# Patient Record
Sex: Female | Born: 1935 | Race: White | Hispanic: No | Smoking: Former smoker
Health system: Southern US, Community
[De-identification: ages and names within clinical notes are randomized; demographics above are authoritative.]

## PROBLEM LIST (undated history)

## (undated) DIAGNOSIS — Z9889 Other specified postprocedural states: Secondary | ICD-10-CM

## (undated) DIAGNOSIS — K219 Gastro-esophageal reflux disease without esophagitis: Secondary | ICD-10-CM

## (undated) DIAGNOSIS — J302 Other seasonal allergic rhinitis: Secondary | ICD-10-CM

## (undated) DIAGNOSIS — R011 Cardiac murmur, unspecified: Secondary | ICD-10-CM

## (undated) DIAGNOSIS — G8929 Other chronic pain: Secondary | ICD-10-CM

## (undated) DIAGNOSIS — M549 Dorsalgia, unspecified: Secondary | ICD-10-CM

## (undated) DIAGNOSIS — E039 Hypothyroidism, unspecified: Secondary | ICD-10-CM

## (undated) DIAGNOSIS — N8110 Cystocele, unspecified: Secondary | ICD-10-CM

## (undated) DIAGNOSIS — F329 Major depressive disorder, single episode, unspecified: Secondary | ICD-10-CM

## (undated) DIAGNOSIS — Z8719 Personal history of other diseases of the digestive system: Secondary | ICD-10-CM

## (undated) DIAGNOSIS — Z8711 Personal history of peptic ulcer disease: Secondary | ICD-10-CM

## (undated) DIAGNOSIS — F419 Anxiety disorder, unspecified: Secondary | ICD-10-CM

## (undated) DIAGNOSIS — C44311 Basal cell carcinoma of skin of nose: Secondary | ICD-10-CM

## (undated) DIAGNOSIS — F32A Depression, unspecified: Secondary | ICD-10-CM

## (undated) DIAGNOSIS — M1990 Unspecified osteoarthritis, unspecified site: Secondary | ICD-10-CM

## (undated) DIAGNOSIS — C50912 Malignant neoplasm of unspecified site of left female breast: Secondary | ICD-10-CM

## (undated) DIAGNOSIS — E7800 Pure hypercholesterolemia, unspecified: Secondary | ICD-10-CM

## (undated) DIAGNOSIS — G43909 Migraine, unspecified, not intractable, without status migrainosus: Secondary | ICD-10-CM

## (undated) DIAGNOSIS — C8590 Non-Hodgkin lymphoma, unspecified, unspecified site: Secondary | ICD-10-CM

## (undated) HISTORY — DX: Other seasonal allergic rhinitis: J30.2

## (undated) HISTORY — DX: Gastro-esophageal reflux disease without esophagitis: K21.9

## (undated) HISTORY — DX: Major depressive disorder, single episode, unspecified: F32.9

## (undated) HISTORY — DX: Depression, unspecified: F32.A

## (undated) HISTORY — PX: CATARACT EXTRACTION W/ INTRAOCULAR LENS  IMPLANT, BILATERAL: SHX1307

## (undated) HISTORY — DX: Personal history of other diseases of the digestive system: Z87.19

## (undated) HISTORY — PX: BACK SURGERY: SHX140

## (undated) HISTORY — DX: Pure hypercholesterolemia, unspecified: E78.00

## (undated) HISTORY — DX: Other chronic pain: G89.29

## (undated) HISTORY — DX: Hypothyroidism, unspecified: E03.9

## (undated) HISTORY — DX: Personal history of peptic ulcer disease: Z87.11

## (undated) HISTORY — DX: Dorsalgia, unspecified: M54.9

## (undated) HISTORY — DX: Anxiety disorder, unspecified: F41.9

## (undated) HISTORY — PX: ABDOMINAL HYSTERECTOMY: SHX81

---
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
## 1968-03-21 DIAGNOSIS — C50912 Malignant neoplasm of unspecified site of left female breast: Secondary | ICD-10-CM

## 1968-03-21 HISTORY — PX: MASTECTOMY, RADICAL: SHX710

## 1968-03-21 HISTORY — DX: Malignant neoplasm of unspecified site of left female breast: C50.912

## 1968-03-21 HISTORY — PX: BREAST BIOPSY: SHX20

## 1997-11-28 ENCOUNTER — Ambulatory Visit (HOSPITAL_COMMUNITY): Admission: RE | Admit: 1997-11-28 | Discharge: 1997-11-28 | Payer: Self-pay | Admitting: Family Medicine

## 1997-12-08 ENCOUNTER — Ambulatory Visit (HOSPITAL_COMMUNITY): Admission: RE | Admit: 1997-12-08 | Discharge: 1997-12-08 | Payer: Self-pay | Admitting: Family Medicine

## 1999-01-22 ENCOUNTER — Encounter (INDEPENDENT_AMBULATORY_CARE_PROVIDER_SITE_OTHER): Payer: Self-pay | Admitting: Specialist

## 1999-01-22 ENCOUNTER — Ambulatory Visit (HOSPITAL_COMMUNITY): Admission: RE | Admit: 1999-01-22 | Discharge: 1999-01-22 | Payer: Self-pay | Admitting: Gastroenterology

## 1999-03-22 DIAGNOSIS — C8590 Non-Hodgkin lymphoma, unspecified, unspecified site: Secondary | ICD-10-CM

## 1999-03-22 HISTORY — DX: Non-Hodgkin lymphoma, unspecified, unspecified site: C85.90

## 1999-03-22 HISTORY — PX: BONE MARROW BIOPSY: SHX199

## 1999-09-02 ENCOUNTER — Encounter: Payer: Self-pay | Admitting: Family Medicine

## 1999-09-02 ENCOUNTER — Ambulatory Visit (HOSPITAL_COMMUNITY): Admission: RE | Admit: 1999-09-02 | Discharge: 1999-09-02 | Payer: Self-pay | Admitting: Family Medicine

## 1999-09-24 ENCOUNTER — Ambulatory Visit (HOSPITAL_COMMUNITY): Admission: RE | Admit: 1999-09-24 | Discharge: 1999-09-24 | Payer: Self-pay | Admitting: Family Medicine

## 1999-09-24 ENCOUNTER — Encounter: Payer: Self-pay | Admitting: Family Medicine

## 1999-09-25 ENCOUNTER — Emergency Department (HOSPITAL_COMMUNITY): Admission: EM | Admit: 1999-09-25 | Discharge: 1999-09-25 | Payer: Self-pay | Admitting: Emergency Medicine

## 1999-09-25 ENCOUNTER — Encounter: Payer: Self-pay | Admitting: Emergency Medicine

## 1999-10-04 ENCOUNTER — Ambulatory Visit (HOSPITAL_COMMUNITY): Admission: RE | Admit: 1999-10-04 | Discharge: 1999-10-04 | Payer: Self-pay | Admitting: Gastroenterology

## 1999-10-04 ENCOUNTER — Encounter: Payer: Self-pay | Admitting: Gastroenterology

## 1999-10-04 ENCOUNTER — Encounter (INDEPENDENT_AMBULATORY_CARE_PROVIDER_SITE_OTHER): Payer: Self-pay | Admitting: *Deleted

## 1999-10-14 ENCOUNTER — Encounter: Payer: Self-pay | Admitting: Oncology

## 1999-10-14 ENCOUNTER — Ambulatory Visit (HOSPITAL_COMMUNITY): Admission: RE | Admit: 1999-10-14 | Discharge: 1999-10-14 | Payer: Self-pay | Admitting: Oncology

## 1999-11-10 ENCOUNTER — Encounter: Admission: RE | Admit: 1999-11-10 | Discharge: 1999-11-10 | Payer: Self-pay | Admitting: General Surgery

## 1999-11-10 ENCOUNTER — Encounter: Payer: Self-pay | Admitting: General Surgery

## 1999-11-11 ENCOUNTER — Encounter: Payer: Self-pay | Admitting: General Surgery

## 1999-11-11 ENCOUNTER — Ambulatory Visit (HOSPITAL_BASED_OUTPATIENT_CLINIC_OR_DEPARTMENT_OTHER): Admission: RE | Admit: 1999-11-11 | Discharge: 1999-11-11 | Payer: Self-pay | Admitting: General Surgery

## 2000-01-29 ENCOUNTER — Emergency Department (HOSPITAL_COMMUNITY): Admission: EM | Admit: 2000-01-29 | Discharge: 2000-01-29 | Payer: Self-pay | Admitting: Emergency Medicine

## 2000-01-29 ENCOUNTER — Encounter: Payer: Self-pay | Admitting: Hematology & Oncology

## 2000-02-11 ENCOUNTER — Encounter: Admission: RE | Admit: 2000-02-11 | Discharge: 2000-02-11 | Payer: Self-pay | Admitting: Oncology

## 2000-02-11 ENCOUNTER — Encounter: Payer: Self-pay | Admitting: Oncology

## 2000-04-17 ENCOUNTER — Encounter: Payer: Self-pay | Admitting: Oncology

## 2000-04-17 ENCOUNTER — Encounter: Admission: RE | Admit: 2000-04-17 | Discharge: 2000-04-17 | Payer: Self-pay | Admitting: Oncology

## 2000-08-11 ENCOUNTER — Encounter: Admission: RE | Admit: 2000-08-11 | Discharge: 2000-08-11 | Payer: Self-pay | Admitting: Oncology

## 2000-08-11 ENCOUNTER — Encounter: Payer: Self-pay | Admitting: Oncology

## 2000-12-13 ENCOUNTER — Encounter: Admission: RE | Admit: 2000-12-13 | Discharge: 2000-12-13 | Payer: Self-pay | Admitting: Oncology

## 2000-12-13 ENCOUNTER — Encounter: Payer: Self-pay | Admitting: Oncology

## 2001-04-17 ENCOUNTER — Ambulatory Visit (HOSPITAL_COMMUNITY): Admission: RE | Admit: 2001-04-17 | Discharge: 2001-04-17 | Payer: Self-pay | Admitting: Oncology

## 2001-04-17 ENCOUNTER — Encounter: Payer: Self-pay | Admitting: Oncology

## 2001-07-24 ENCOUNTER — Encounter: Payer: Self-pay | Admitting: Family Medicine

## 2001-07-24 ENCOUNTER — Ambulatory Visit (HOSPITAL_COMMUNITY): Admission: RE | Admit: 2001-07-24 | Discharge: 2001-07-24 | Payer: Self-pay | Admitting: Family Medicine

## 2001-10-11 ENCOUNTER — Ambulatory Visit (HOSPITAL_COMMUNITY): Admission: RE | Admit: 2001-10-11 | Discharge: 2001-10-11 | Payer: Self-pay | Admitting: Oncology

## 2001-10-11 ENCOUNTER — Encounter: Payer: Self-pay | Admitting: Oncology

## 2002-01-03 ENCOUNTER — Emergency Department (HOSPITAL_COMMUNITY): Admission: EM | Admit: 2002-01-03 | Discharge: 2002-01-03 | Payer: Self-pay | Admitting: Emergency Medicine

## 2002-04-24 ENCOUNTER — Encounter: Payer: Self-pay | Admitting: Oncology

## 2002-04-24 ENCOUNTER — Ambulatory Visit (HOSPITAL_COMMUNITY): Admission: RE | Admit: 2002-04-24 | Discharge: 2002-04-24 | Payer: Self-pay | Admitting: Oncology

## 2002-06-13 ENCOUNTER — Other Ambulatory Visit (HOSPITAL_COMMUNITY): Admission: RE | Admit: 2002-06-13 | Discharge: 2002-06-27 | Payer: Self-pay | Admitting: *Deleted

## 2002-08-13 ENCOUNTER — Encounter: Payer: Self-pay | Admitting: Oncology

## 2002-08-13 ENCOUNTER — Ambulatory Visit (HOSPITAL_COMMUNITY): Admission: RE | Admit: 2002-08-13 | Discharge: 2002-08-13 | Payer: Self-pay | Admitting: Oncology

## 2002-10-21 ENCOUNTER — Ambulatory Visit (HOSPITAL_COMMUNITY): Admission: RE | Admit: 2002-10-21 | Discharge: 2002-10-21 | Payer: Self-pay | Admitting: Gastroenterology

## 2002-12-20 ENCOUNTER — Ambulatory Visit (HOSPITAL_COMMUNITY): Admission: RE | Admit: 2002-12-20 | Discharge: 2002-12-20 | Payer: Self-pay | Admitting: Oncology

## 2002-12-20 ENCOUNTER — Encounter: Payer: Self-pay | Admitting: Oncology

## 2003-04-23 ENCOUNTER — Ambulatory Visit (HOSPITAL_COMMUNITY): Admission: RE | Admit: 2003-04-23 | Discharge: 2003-04-23 | Payer: Self-pay | Admitting: Oncology

## 2003-05-12 ENCOUNTER — Ambulatory Visit (HOSPITAL_BASED_OUTPATIENT_CLINIC_OR_DEPARTMENT_OTHER): Admission: RE | Admit: 2003-05-12 | Discharge: 2003-05-12 | Payer: Self-pay | Admitting: General Surgery

## 2003-10-22 ENCOUNTER — Ambulatory Visit (HOSPITAL_COMMUNITY): Admission: RE | Admit: 2003-10-22 | Discharge: 2003-10-22 | Payer: Self-pay | Admitting: Oncology

## 2004-04-21 ENCOUNTER — Ambulatory Visit: Payer: Self-pay | Admitting: Oncology

## 2004-04-21 ENCOUNTER — Ambulatory Visit (HOSPITAL_COMMUNITY): Admission: RE | Admit: 2004-04-21 | Discharge: 2004-04-21 | Payer: Self-pay | Admitting: Oncology

## 2004-07-29 ENCOUNTER — Emergency Department (HOSPITAL_COMMUNITY): Admission: EM | Admit: 2004-07-29 | Discharge: 2004-07-29 | Payer: Self-pay | Admitting: Emergency Medicine

## 2004-10-20 ENCOUNTER — Ambulatory Visit: Payer: Self-pay | Admitting: Oncology

## 2004-10-22 ENCOUNTER — Ambulatory Visit (HOSPITAL_COMMUNITY): Admission: RE | Admit: 2004-10-22 | Discharge: 2004-10-22 | Payer: Self-pay | Admitting: Oncology

## 2005-02-11 ENCOUNTER — Emergency Department (HOSPITAL_COMMUNITY): Admission: EM | Admit: 2005-02-11 | Discharge: 2005-02-11 | Payer: Self-pay | Admitting: Emergency Medicine

## 2005-04-21 ENCOUNTER — Ambulatory Visit: Payer: Self-pay | Admitting: Oncology

## 2005-10-25 ENCOUNTER — Ambulatory Visit: Payer: Self-pay | Admitting: Oncology

## 2005-10-27 LAB — CBC WITH DIFFERENTIAL/PLATELET
BASO%: 0.7 % (ref 0.0–2.0)
Eosinophils Absolute: 0.3 10*3/uL (ref 0.0–0.5)
HCT: 37.6 % (ref 34.8–46.6)
MCHC: 35 g/dL (ref 32.0–36.0)
MONO#: 0.5 10*3/uL (ref 0.1–0.9)
NEUT#: 5.7 10*3/uL (ref 1.5–6.5)
NEUT%: 73.2 % (ref 39.6–76.8)
RBC: 4.16 10*6/uL (ref 3.70–5.32)
WBC: 7.9 10*3/uL (ref 3.9–10.0)
lymph#: 1.2 10*3/uL (ref 0.9–3.3)

## 2005-10-27 LAB — ERYTHROCYTE SEDIMENTATION RATE: Sed Rate: 26 mm/hr (ref 0–30)

## 2005-10-28 ENCOUNTER — Ambulatory Visit (HOSPITAL_COMMUNITY): Admission: RE | Admit: 2005-10-28 | Discharge: 2005-10-28 | Payer: Self-pay | Admitting: Oncology

## 2005-10-28 LAB — COMPREHENSIVE METABOLIC PANEL
AST: 28 U/L (ref 0–37)
Albumin: 4.6 g/dL (ref 3.5–5.2)
Alkaline Phosphatase: 82 U/L (ref 39–117)
Calcium: 9.5 mg/dL (ref 8.4–10.5)
Chloride: 102 mEq/L (ref 96–112)
Potassium: 4.1 mEq/L (ref 3.5–5.3)
Sodium: 136 mEq/L (ref 135–145)
Total Protein: 7 g/dL (ref 6.0–8.3)

## 2006-04-12 ENCOUNTER — Encounter: Admission: RE | Admit: 2006-04-12 | Discharge: 2006-07-11 | Payer: Self-pay | Admitting: Family Medicine

## 2006-07-12 ENCOUNTER — Encounter: Admission: RE | Admit: 2006-07-12 | Discharge: 2006-07-27 | Payer: Self-pay | Admitting: Family Medicine

## 2006-10-23 ENCOUNTER — Ambulatory Visit: Payer: Self-pay | Admitting: Oncology

## 2006-10-26 LAB — CBC WITH DIFFERENTIAL/PLATELET
Basophils Absolute: 0 10*3/uL (ref 0.0–0.1)
Eosinophils Absolute: 0.3 10*3/uL (ref 0.0–0.5)
MONO#: 0.6 10*3/uL (ref 0.1–0.9)
NEUT%: 70.1 % (ref 39.6–76.8)
RDW: 15.4 % — ABNORMAL HIGH (ref 11.3–14.5)
lymph#: 1.2 10*3/uL (ref 0.9–3.3)

## 2006-10-27 LAB — COMPREHENSIVE METABOLIC PANEL
Albumin: 4.2 g/dL (ref 3.5–5.2)
BUN: 30 mg/dL — ABNORMAL HIGH (ref 6–23)
Calcium: 9.7 mg/dL (ref 8.4–10.5)
Chloride: 104 mEq/L (ref 96–112)
Glucose, Bld: 91 mg/dL (ref 70–99)
Potassium: 4.1 mEq/L (ref 3.5–5.3)
Sodium: 142 mEq/L (ref 135–145)
Total Protein: 6.9 g/dL (ref 6.0–8.3)

## 2006-10-27 LAB — BETA 2 MICROGLOBULIN, SERUM: Beta-2 Microglobulin: 1.9 mg/L — ABNORMAL HIGH (ref 1.01–1.73)

## 2006-10-31 ENCOUNTER — Ambulatory Visit (HOSPITAL_COMMUNITY): Admission: RE | Admit: 2006-10-31 | Discharge: 2006-10-31 | Payer: Self-pay | Admitting: Oncology

## 2007-10-23 ENCOUNTER — Ambulatory Visit: Payer: Self-pay | Admitting: Oncology

## 2007-11-28 LAB — CBC WITH DIFFERENTIAL/PLATELET
BASO%: 0.2 % (ref 0.0–2.0)
Basophils Absolute: 0 10*3/uL (ref 0.0–0.1)
EOS%: 2.9 % (ref 0.0–7.0)
HCT: 36.7 % (ref 34.8–46.6)
HGB: 12.6 g/dL (ref 11.6–15.9)
LYMPH%: 14.9 % (ref 14.0–48.0)
MCH: 29.8 pg (ref 26.0–34.0)
MCHC: 34.4 g/dL (ref 32.0–36.0)
MCV: 86.7 fL (ref 81.0–101.0)
MONO%: 9.5 % (ref 0.0–13.0)
NEUT%: 72.5 % (ref 39.6–76.8)
Platelets: 224 10*3/uL (ref 145–400)

## 2007-11-29 LAB — COMPREHENSIVE METABOLIC PANEL
AST: 21 U/L (ref 0–37)
Albumin: 4.3 g/dL (ref 3.5–5.2)
Alkaline Phosphatase: 78 U/L (ref 39–117)
BUN: 18 mg/dL (ref 6–23)
Creatinine, Ser: 1.2 mg/dL (ref 0.40–1.20)
Glucose, Bld: 82 mg/dL (ref 70–99)
Potassium: 4.6 mEq/L (ref 3.5–5.3)

## 2007-11-29 LAB — BETA 2 MICROGLOBULIN, SERUM: Beta-2 Microglobulin: 1.4 mg/L (ref 1.01–1.73)

## 2008-05-11 ENCOUNTER — Emergency Department (HOSPITAL_COMMUNITY): Admission: EM | Admit: 2008-05-11 | Discharge: 2008-05-11 | Payer: Self-pay | Admitting: Emergency Medicine

## 2008-11-19 ENCOUNTER — Emergency Department (HOSPITAL_COMMUNITY): Admission: EM | Admit: 2008-11-19 | Discharge: 2008-11-20 | Payer: Self-pay | Admitting: Emergency Medicine

## 2008-11-21 ENCOUNTER — Ambulatory Visit: Payer: Self-pay | Admitting: Oncology

## 2008-11-26 LAB — CBC WITH DIFFERENTIAL/PLATELET
Basophils Absolute: 0 10*3/uL (ref 0.0–0.1)
EOS%: 5.5 % (ref 0.0–7.0)
Eosinophils Absolute: 0.3 10*3/uL (ref 0.0–0.5)
HGB: 11 g/dL — ABNORMAL LOW (ref 11.6–15.9)
MCH: 27.2 pg (ref 25.1–34.0)
MONO%: 7.4 % (ref 0.0–14.0)
NEUT#: 3.9 10*3/uL (ref 1.5–6.5)
RBC: 4.05 10*6/uL (ref 3.70–5.45)
RDW: 15.1 % — ABNORMAL HIGH (ref 11.2–14.5)
lymph#: 1.1 10*3/uL (ref 0.9–3.3)

## 2008-11-27 LAB — VITAMIN D 25 HYDROXY (VIT D DEFICIENCY, FRACTURES): Vit D, 25-Hydroxy: 36 ng/mL (ref 30–89)

## 2008-11-27 LAB — COMPREHENSIVE METABOLIC PANEL
AST: 18 U/L (ref 0–37)
Albumin: 4.4 g/dL (ref 3.5–5.2)
Alkaline Phosphatase: 70 U/L (ref 39–117)
BUN: 19 mg/dL (ref 6–23)
Calcium: 9.6 mg/dL (ref 8.4–10.5)
Chloride: 105 mEq/L (ref 96–112)
Potassium: 4.1 mEq/L (ref 3.5–5.3)
Sodium: 141 mEq/L (ref 135–145)
Total Protein: 7 g/dL (ref 6.0–8.3)

## 2009-05-19 ENCOUNTER — Ambulatory Visit: Payer: Self-pay | Admitting: Oncology

## 2009-11-20 ENCOUNTER — Ambulatory Visit: Payer: Self-pay | Admitting: Oncology

## 2009-11-26 LAB — CBC WITH DIFFERENTIAL/PLATELET
Basophils Absolute: 0 10*3/uL (ref 0.0–0.1)
EOS%: 4.5 % (ref 0.0–7.0)
Eosinophils Absolute: 0.3 10*3/uL (ref 0.0–0.5)
LYMPH%: 19.9 % (ref 14.0–49.7)
MCH: 31.6 pg (ref 25.1–34.0)
MCV: 95 fL (ref 79.5–101.0)
MONO%: 8.5 % (ref 0.0–14.0)
Platelets: 204 10*3/uL (ref 145–400)
RBC: 4.18 10*6/uL (ref 3.70–5.45)
RDW: 13 % (ref 11.2–14.5)

## 2009-11-26 LAB — COMPREHENSIVE METABOLIC PANEL
AST: 17 U/L (ref 0–37)
Albumin: 4.3 g/dL (ref 3.5–5.2)
Alkaline Phosphatase: 64 U/L (ref 39–117)
BUN: 20 mg/dL (ref 6–23)
Glucose, Bld: 96 mg/dL (ref 70–99)
Potassium: 4.4 mEq/L (ref 3.5–5.3)
Sodium: 142 mEq/L (ref 135–145)
Total Bilirubin: 0.3 mg/dL (ref 0.3–1.2)

## 2009-11-26 LAB — VITAMIN D 25 HYDROXY (VIT D DEFICIENCY, FRACTURES): Vit D, 25-Hydroxy: 37 ng/mL (ref 30–89)

## 2009-11-26 LAB — CANCER ANTIGEN 27.29: CA 27.29: 37 U/mL (ref 0–39)

## 2010-01-13 ENCOUNTER — Emergency Department (HOSPITAL_COMMUNITY): Admission: EM | Admit: 2010-01-13 | Discharge: 2010-01-13 | Payer: Self-pay | Admitting: Emergency Medicine

## 2010-03-21 HISTORY — PX: LUMBAR LAMINECTOMY: SHX95

## 2010-04-11 ENCOUNTER — Emergency Department (HOSPITAL_COMMUNITY)
Admission: EM | Admit: 2010-04-11 | Discharge: 2010-04-11 | Payer: Self-pay | Source: Home / Self Care | Admitting: Emergency Medicine

## 2010-04-13 LAB — DIFFERENTIAL
Basophils Absolute: 0 10*3/uL (ref 0.0–0.1)
Basophils Relative: 0 % (ref 0–1)
Monocytes Relative: 8 % (ref 3–12)
Neutro Abs: 6.2 10*3/uL (ref 1.7–7.7)
Neutrophils Relative %: 80 % — ABNORMAL HIGH (ref 43–77)

## 2010-04-13 LAB — CBC
Hemoglobin: 14.3 g/dL (ref 12.0–15.0)
Platelets: 194 10*3/uL (ref 150–400)
RBC: 4.34 MIL/uL (ref 3.87–5.11)
WBC: 7.8 10*3/uL (ref 4.0–10.5)

## 2010-04-13 LAB — COMPREHENSIVE METABOLIC PANEL
ALT: 18 U/L (ref 0–35)
AST: 20 U/L (ref 0–37)
Albumin: 3.8 g/dL (ref 3.5–5.2)
CO2: 26 mEq/L (ref 19–32)
Chloride: 107 mEq/L (ref 96–112)
GFR calc Af Amer: >60 mL/min (ref >60)
GFR calc non Af Amer: >60 mL/min (ref >60)
Potassium: 4.1 mEq/L (ref 3.5–5.1)
Sodium: 142 mEq/L (ref 135–145)
Total Bilirubin: 0.6 mg/dL (ref 0.3–1.2)

## 2010-06-25 LAB — CBC
HCT: 30.6 % — ABNORMAL LOW (ref 36.0–46.0)
Hemoglobin: 10 g/dL — ABNORMAL LOW (ref 12.0–15.0)
MCHC: 32.8 g/dL (ref 30.0–36.0)
MCV: 82.1 fL (ref 78.0–100.0)
Platelets: 227 10*3/uL (ref 150–400)
RDW: 14.9 % (ref 11.5–15.5)

## 2010-06-25 LAB — URINE CULTURE: Culture: NO GROWTH

## 2010-06-25 LAB — URINALYSIS, ROUTINE W REFLEX MICROSCOPIC
Hgb urine dipstick: NEGATIVE
Nitrite: NEGATIVE
Protein, ur: NEGATIVE mg/dL
Specific Gravity, Urine: 1.023 (ref 1.005–1.030)
Urobilinogen, UA: 0.2 mg/dL (ref 0.0–1.0)

## 2010-06-25 LAB — URINE MICROSCOPIC-ADD ON

## 2010-06-25 LAB — POCT I-STAT, CHEM 8
BUN: 32 mg/dL — ABNORMAL HIGH (ref 6–23)
Calcium, Ion: 1.03 mmol/L — ABNORMAL LOW (ref 1.12–1.32)
Hemoglobin: 10.2 g/dL — ABNORMAL LOW (ref 12.0–15.0)
Sodium: 139 mEq/L (ref 135–145)
TCO2: 23 mmol/L (ref 0–100)

## 2010-06-25 LAB — POCT CARDIAC MARKERS

## 2010-06-25 LAB — DIFFERENTIAL
Basophils Absolute: 0 10*3/uL (ref 0.0–0.1)
Eosinophils Absolute: 0.6 10*3/uL (ref 0.0–0.7)
Eosinophils Relative: 10 % — ABNORMAL HIGH (ref 0–5)
Lymphocytes Relative: 16 % (ref 12–46)
Monocytes Absolute: 0.6 10*3/uL (ref 0.1–1.0)

## 2010-07-06 LAB — DIFFERENTIAL
Basophils Absolute: 0 10*3/uL (ref 0.0–0.1)
Basophils Relative: 0 % (ref 0–1)
Lymphocytes Relative: 15 % (ref 12–46)
Neutro Abs: 5.4 10*3/uL (ref 1.7–7.7)

## 2010-07-06 LAB — CBC
HCT: 41.7 % (ref 36.0–46.0)
Hemoglobin: 14.2 g/dL (ref 12.0–15.0)
MCV: 92.6 fL (ref 78.0–100.0)
RBC: 4.5 MIL/uL (ref 3.87–5.11)
WBC: 7.6 10*3/uL (ref 4.0–10.5)

## 2010-07-06 LAB — URINALYSIS, ROUTINE W REFLEX MICROSCOPIC
Bilirubin Urine: NEGATIVE
Ketones, ur: NEGATIVE mg/dL
Nitrite: NEGATIVE
Specific Gravity, Urine: 1.012 (ref 1.005–1.030)
Urobilinogen, UA: 0.2 mg/dL (ref 0.0–1.0)

## 2010-07-06 LAB — BASIC METABOLIC PANEL
BUN: 18 mg/dL (ref 6–23)
CO2: 25 mEq/L (ref 19–32)
GFR calc non Af Amer: 49 mL/min — ABNORMAL LOW (ref >60)

## 2010-07-06 LAB — TROPONIN I: Troponin I: 0.01 ng/mL (ref 0.00–0.06)

## 2010-07-06 LAB — CK TOTAL AND CKMB (NOT AT ARMC): Relative Index: 3.1 — ABNORMAL HIGH (ref 0.0–2.5)

## 2010-08-06 NOTE — Op Note (Signed)
NAME:  Dana [name], Dana [name]                         ACCOUNT NO.:  [id number]   MEDICAL RECORD NO.:  [id number]                   PATIENT TYPE:  AMB   LOCATION:  ENDO                                 FACILITY:  [facility]   PHYSICIAN:  Petra Kuba, M.D.                 DATE OF BIRTH:  03-27-1927   DATE OF PROCEDURE:  10/21/2002  DATE OF DISCHARGE:                                 OPERATIVE REPORT   PROCEDURE:  Colonoscopy.   INDICATION:  The patient with a history of colon polyps, due for repeat  screening.  Consent was signed after risks, benefits, methods, options  thoroughly discussed in the office on multiple occasions.   MEDICINES USED:  1. Demerol 60.  2. Versed 5.   DESCRIPTION OF PROCEDURE:  Rectal inspection was pertinent for external  hemorrhoids, small.  Digital exam was negative.  The video pediatric  adjustable colonoscope was inserted, fairly easily advanced around the colon  to the cecum.  This did require some abdominal pressure but no position  changes.  On insertion, other than some occasional left-sided diverticula,  no abnormalities were seen.  The cecum was identified by the appendiceal  orifice and the ileocecal valve.  In fact, the scope was inserted a short  ways into the terminal ileum which was normal.  Photodocumentation was  obtained.  The scope was slowly withdrawn.  The TI was normal.  The prep was  adequate.  There was some liquid stool that required washing and suctioning  but on slow withdrawal back through the colon, other than some occasional  left-sided diverticula, no abnormalities were seen.  Anorectal pull-through  and retroflexion confirmed some small hemorrhoids back in the rectum.  The  scope was reinserted a short ways up the left side of the colon; air was  suctioned, scope removed.  The patient tolerated the procedure well.  There  was no obvious immediate complication.   ENDOSCOPIC DIAGNOSES:  1. Internal-external hemorrhoids.  2.  Left-sided diverticula.  3. Otherwise, within normal limits to the terminal ileum.    PLAN:  1. Yearly rectals and guaiacs per Dario Guardian, M.D. or Pierce Crane,     M.D. and is oncologic team.  2. Happy to see back p.r.n.  3. Otherwise, consider repeat screening in five years if doing well     medically.                                               Petra Kuba, M.D.    MEM/MEDQ  D:  10/21/2002  T:  10/21/2002  Job:  [id number]   cc:   Dario Guardian, M.D.  [address]  [place]  [place] [address]  Fax: [phone]   Pierce Crane, M.D.  [address] - [facility]  [place]  [place] [address]  Fax: [phone]

## 2010-08-06 NOTE — Op Note (Signed)
[place]  Patient:    Dana [name], Dana [name]                      MRN: [id number] Proc. Date: 11/11/99 Adm. Date:  [id number] Attending:  Janalyn Rouse CC:         Pierce Crane, M.D.   Operative Report  PREOPERATIVE DIAGNOSES: 1. Non-Hodgkins lymphoma. 2. History of breast cancer.  POSTOPERATIVE DIAGNOSES: 1. Non-Hodgkins lymphoma. 2. History of breast cancer.  OPERATION PERFORMED:  Insertion of a Port-A-Cath.  ANESTHESIA:  MAC.  OPERATIVE PROCEDURE:  This patient had a previous left modified mastectomy some 30 years ago.  Subsequently, she has now developed Stage IV non-Hodgkins lymphoma for which she is undergoing chemotherapy.  We are going to place a Port-A-Cath.  The patient was placed on the operating table with a roll between the shoulders and the arms fixed at the sides.  Under local anesthesia, a right subclavian puncture was carried out with ease and a guidewire inserted and proper positioning confirmed by fluoroscopy.  Under local, a transverse incision on the right anterior chest wall was made with development of a pocket for the implantable port.  We then tunnelled between the subclavian area and the pocket and then passed the pre-attached catheter of the Bard implantable port.  I fixed the port, which had been flushed with heparinized saline, in the pocket with two dual Prolene sutures. We then passed the dilator and peel-away sheath over the wire, removed the wire, removed the dilator and passed the catheter through the peel-away sheath and removed it.  Again, proper positioning of the catheter tip was confirmed and there was no kinking of the catheter.  The system was then flushed and fully heparinized.  The incisions were closed with subcuticular 4-0 Monocryl and Steri-Strips.  Dressing were applied.  She was not yet accessed.  She was then transferred to the recovery room in satisfactory condition, having tolerated  the procedure well. DD:  11/11/99 TD:  11/11/99 Job: [id number]

## 2010-08-06 NOTE — Op Note (Signed)
NAME:  Dana [name], Dana [name]                         ACCOUNT NO.:  [id number]   MEDICAL RECORD NO.:  [id number]                   PATIENT TYPE:  AMB   LOCATION:  DSC                                  FACILITY:  [facility]   PHYSICIAN:  Rose Phi. Maple Hudson, M.D.                DATE OF BIRTH:  1927/10/10   DATE OF PROCEDURE:  05/12/2003  DATE OF DISCHARGE:                                 OPERATIVE REPORT   PREOPERATIVE DIAGNOSIS:  Status post chemotherapy.   POSTOPERATIVE DIAGNOSIS:  Status post chemotherapy.   PROCEDURE:  Removal of Port-A-Cath.   SURGEON:  Rose Phi. Maple Hudson, M.D.   ANESTHESIA:  Local.   DESCRIPTION OF PROCEDURE:  The patient was placed on the operating table and  the right upper chest with the Port-A-Cath site was prepped and draped in  the usual fashion.  I anesthetized in the old scar.  I then made the  incision and dissected down and identified the catheter which I grasped and  removed.  Then by pulling on the catheter, I exposed the port and the two  sutures holding it in place.  These were cut and the port slipped out. There  was no bleeding.  A subcuticular closure of 4-0 Monocryl and Steri-Strips  were carried out.  Dressing applied.  The patient was transferred to the  recovery room in satisfactory condition having tolerated the procedure well.                                               Rose Phi. Maple Hudson, M.D.    PRY/MEDQ  D:  05/12/2003  T:  05/12/2003  Job:  [id number]

## 2010-11-08 ENCOUNTER — Other Ambulatory Visit: Payer: Self-pay | Admitting: Family Medicine

## 2010-11-08 DIAGNOSIS — M545 Low back pain: Secondary | ICD-10-CM

## 2010-11-13 ENCOUNTER — Ambulatory Visit
Admission: RE | Admit: 2010-11-13 | Discharge: 2010-11-13 | Disposition: A | Discharge status: Home / Self Care | Payer: Self-pay | Source: Ambulatory Visit | Attending: Family Medicine | Admitting: Family Medicine

## 2010-11-13 DIAGNOSIS — M545 Low back pain: Secondary | ICD-10-CM

## 2010-11-14 ENCOUNTER — Emergency Department (HOSPITAL_COMMUNITY): Payer: Medicare Other

## 2010-11-14 ENCOUNTER — Inpatient Hospital Stay (HOSPITAL_COMMUNITY)
Admission: EM | Admit: 2010-11-14 | Discharge: 2010-11-16 | DRG: 313 | Disposition: A | Discharge status: Home Health | Payer: Medicare Other | Attending: Internal Medicine | Admitting: Internal Medicine

## 2010-11-14 DIAGNOSIS — M48061 Spinal stenosis, lumbar region without neurogenic claudication: Secondary | ICD-10-CM | POA: Diagnosis present

## 2010-11-14 DIAGNOSIS — E039 Hypothyroidism, unspecified: Secondary | ICD-10-CM | POA: Diagnosis present

## 2010-11-14 DIAGNOSIS — M545 Low back pain, unspecified: Secondary | ICD-10-CM | POA: Diagnosis present

## 2010-11-14 DIAGNOSIS — M5430 Sciatica, unspecified side: Secondary | ICD-10-CM | POA: Diagnosis present

## 2010-11-14 DIAGNOSIS — R0789 Other chest pain: Principal | ICD-10-CM | POA: Diagnosis present

## 2010-11-14 DIAGNOSIS — G8929 Other chronic pain: Secondary | ICD-10-CM | POA: Diagnosis present

## 2010-11-14 DIAGNOSIS — Z853 Personal history of malignant neoplasm of breast: Secondary | ICD-10-CM

## 2010-11-14 LAB — CBC
MCH: 32.3 pg (ref 26.0–34.0)
Platelets: 207 10*3/uL (ref 150–400)
RBC: 3.93 MIL/uL (ref 3.87–5.11)
RDW: 12.3 % (ref 11.5–15.5)
WBC: 7.4 10*3/uL (ref 4.0–10.5)

## 2010-11-14 LAB — COMPREHENSIVE METABOLIC PANEL
ALT: 16 U/L (ref 0–35)
BUN: 18 mg/dL (ref 6–23)
CO2: 26 mEq/L (ref 19–32)
Calcium: 10.1 mg/dL (ref 8.4–10.5)
Creatinine, Ser: 0.76 mg/dL (ref 0.50–1.10)
GFR calc Af Amer: >60 mL/min (ref >60)
GFR calc non Af Amer: >60 mL/min (ref >60)
Glucose, Bld: 102 mg/dL — ABNORMAL HIGH (ref 70–99)
Sodium: 136 mEq/L (ref 135–145)
Total Protein: 7.1 g/dL (ref 6.0–8.3)

## 2010-11-14 LAB — CARDIAC PANEL(CRET KIN+CKTOT+MB+TROPI)
CK, MB: 5.8 ng/mL — ABNORMAL HIGH (ref 0.3–4.0)
Relative Index: 3.2 — ABNORMAL HIGH (ref 0.0–2.5)
Total CK: 198 U/L — ABNORMAL HIGH (ref 7–177)
Total CK: 180 U/L — ABNORMAL HIGH (ref 7–177)
Troponin I: <0.3 ng/mL (ref <0.30)

## 2010-11-14 LAB — PROTIME-INR: Prothrombin Time: 13.4 seconds (ref 11.6–15.2)

## 2010-11-14 LAB — DIFFERENTIAL
Basophils Relative: 1 % (ref 0–1)
Eosinophils Absolute: 0.5 10*3/uL (ref 0.0–0.7)
Eosinophils Relative: 6 % — ABNORMAL HIGH (ref 0–5)
Monocytes Relative: 8 % (ref 3–12)
Neutrophils Relative %: 66 % (ref 43–77)

## 2010-11-14 LAB — POCT I-STAT TROPONIN I

## 2010-11-14 LAB — CK TOTAL AND CKMB (NOT AT ARMC)
CK, MB: 6.8 ng/mL (ref 0.3–4.0)
Relative Index: 3.1 — ABNORMAL HIGH (ref 0.0–2.5)
Total CK: 216 U/L — ABNORMAL HIGH (ref 7–177)

## 2010-11-14 NOTE — H&P (Signed)
[name], [name]               ACCOUNT NO.:  [id number]  MEDICAL RECORD NO.:  [id number]  LOCATION:  WLED                         FACILITY:  [facility]  PHYSICIAN:  Celso Amy, MD   DATE OF BIRTH:  1927-11-09  DATE OF ADMISSION:  11/14/2010 DATE OF DISCHARGE:                             HISTORY & PHYSICAL   PRIMARY CARE:  Dario Guardian, M.D.  CHIEF COMPLAINT:  Chest pain.  HISTORY OF PRESENT ILLNESS:  The patient is a 77 year old white female with a past medical history of hypercholesterolemia, who presents to the ER with chief complaint of chest pain.  History of present illness dates back to 3-4 days ago when the patient had onset of chest pain, it was slow in onset, progressive in nature, intermittent in quality, lasted for only few seconds in duration, and nonradiating in nature.  The patient complains that her chest pain is associated with nausea.  She also stated that her chest pain is associated with bad thought.  The patient associates that her chest pain starts after her leg started hurting.  No complaint of dyspnea during chest pain, but the patient complains of dyspnea on exertion.  No complaint of syncope.  No complaint of abdominal pain.  No complaint of vomiting.  No complaint of fever, cough, or chills.  No complaint of change in vision.  No complaint of change in speech.  REVIEW OF SYSTEMS:  Positive for leg pain from sciatica, numbness in feet, and NSAID use because of the above.  ALLERGIES:  The patient says she is allergic to PHENERGAN and AMOXICILLIN.  SOCIAL HISTORY:  The patient lives alone, is able to perform ADLs but needs help with IADLs.  Denies smoking, drinking, or illegal drug abuse.  FAMILY HISTORY:  The patient mother died from diabetes and coronary disease at the age of 24.  Father died at the age of 23 from prostate cancer.  PAST MEDICAL HISTORY:  Positive for: 1. Breast cancer.  The patient is status post mastectomy, status  post     chemotherapy. 2. Anemia. 3. Diverticulitis. 4. Hiatal hernia. 5. Hypercholesterolemia. 6. Hypothyroidism. 7. Lymphoma. 8. Sciatica.  MEDICATIONS AS OUTPATIENT:  The patient is on: 1. Synthroid 88 mcg p.o. daily. 2. Claritin 10 mg p.o. daily. 3. Prilosec 150 mg p.o. daily. 4. Iron sulfate 150 mg p.o. daily. 5. Calcium 500 mg p.o. b.i.d. 6. Omeprazole 20 mg p.o. daily. 7. Simvastatin 20 mg p.o. daily. 8. Advil 600 mg p.o. t.i.d. 9. Valium 5 mg p.o. p.r.n. as needed. 10.Aspirin 325 mg p.o. daily. 11.MiraLax as needed. 12.Dulcolax as needed. 13.Bupropion 150 mg p.o. daily.  PHYSICAL EXAMINATION:  VITAL SIGNS:  Blood pressure 162/80, pulse 72, respiratory rate 20, temperature afebrile. GENERAL:  The patient is well-built, well-nourished, lying comfortably on the bed, awake, alert, oriented to time, place, and person, in no apparent distress. HEENT:  Pupils equally reactive to light and accommodation.  Extraocular muscles are intact. HEAD:  Atraumatic, normocephalic. NECK:  Supple. RESPIRATORY:  No acute respiratory distress. CHEST:  Clear to auscultation bilaterally. CARDIOVASCULAR:  S1-S2, regular rate and rhythm.  No murmurs were appreciated. GI:  Bowel sounds present, soft, nontender, nondistended. EXTREMITIES:  No  lower extremity edema or cyanosis was seen. CNS:  Cranial nerves II-XII are grossly intact.  No focal motor deficit was seen. PSYCH:  Normal mood and affect. SKIN:  No rash was seen.  LABORATORY DATA:  Sodium 136, potassium is 3.9, serum chloride 99, bicarb 26, BUN 18, serum creatinine 0.7, glucose 103.  The patient's hemoglobin is 12.7, platelets 207, WBC 7.4.  Calcium 10.1, albumin is 3.9.  The patient's AST, ALT, ALP are normal.  EKG shows normal sinus rhythm.  No ST-T changes were seen.  Chest x-ray shows no acute abnormality, has stable large hiatal hernia and stable left basilar scarring.  IMPRESSION: 1. Chest pain.  The patient being  admitted with atypical chest pain.     This could be esophagitis versus possible MI as in female chest     pain can occur with atypical symptom. 2. Endo.  The patient has history of hypothyroidism. 3. Musculoskeletal.  The patient has history of sciatica. 4. Hypercholesterolemia.  The patient is on statin. 5. Anemia.  The patient's hemoglobin is stable. 6. Deep vein thrombosis.  The patient will need DVT prophylaxis. 7. Hypertension.  The patient does not carry a diagnosis of     hypertension and neither is on meds, but blood pressure today is     162/79.  We will observe .  PLAN: 1. We will admit. 2. We will cycle trop to rule out MI. 3. We will start the patient on aspirin. 4. We will discontinue NSAIDs. 5. We will start the patient on Protonix as this could be     esophagitis/gastritis from NSAID use. 6. We will start the patient on low-dose beta blockers as the     patient's blood pressure in the higher side and the patient is     being admitted to rule out MI. 7. The patient's further treatment and plan does how she does with     this plan.     Celso Amy, MD     MB/MEDQ  D:  11/14/2010  T:  11/14/2010  Job:  [id number]  Electronically Signed by Celso Amy M.D. on 11/14/2010 05:46:11 PM

## 2010-11-15 LAB — CARDIAC PANEL(CRET KIN+CKTOT+MB+TROPI)
CK, MB: 4.9 ng/mL — ABNORMAL HIGH (ref 0.3–4.0)
Total CK: 151 U/L (ref 7–177)

## 2010-11-15 LAB — BASIC METABOLIC PANEL
BUN: 19 mg/dL (ref 6–23)
CO2: 27 mEq/L (ref 19–32)
Calcium: 10.3 mg/dL (ref 8.4–10.5)
GFR calc non Af Amer: >60 mL/min (ref >60)
Glucose, Bld: 112 mg/dL — ABNORMAL HIGH (ref 70–99)
Sodium: 140 mEq/L (ref 135–145)

## 2010-11-16 ENCOUNTER — Ambulatory Visit (HOSPITAL_COMMUNITY): Payer: Medicare Other

## 2010-11-16 MED ORDER — TECHNETIUM TC 99M TETROFOSMIN IV KIT
30.0000 | PACK | Freq: Once | INTRAVENOUS | Status: AC | PRN
  Administered 2010-11-16: 30 via INTRAVENOUS

## 2010-11-16 MED ORDER — TECHNETIUM TC 99M TETROFOSMIN IV KIT
10.0000 | PACK | Freq: Once | INTRAVENOUS | Status: AC | PRN
  Administered 2010-11-16: 10 via INTRAVENOUS

## 2010-11-22 NOTE — Discharge Summary (Signed)
[name], [name]               ACCOUNT NO.:  [id number]  MEDICAL RECORD NO.:  [id number]  LOCATION:  1445                         FACILITY:  [name]  PHYSICIAN:  Dana Massed, MD    DATE OF BIRTH:  09/21/1927  DATE OF ADMISSION:  11/14/2010 DATE OF DISCHARGE:  11/16/2010                        DISCHARGE SUMMARY - REFERRING   PRIMARY CARE PRACTITIONER:  Dr. Merri [name].  PRIMARY DISCHARGE DIAGNOSIS:  Chest pain with nuclear stress test negative for any inducible ischemia.  However, it also shows a scar involving the inferoseptal walls with an EF of around 39% and dyskinetic inferior wall motion.  PAST MEDICAL HISTORY/SECONDARY DISCHARGE DIAGNOSES: 1. History of severe spinal stenosis. 2. Dyslipidemia. 3. Diverticulitis. 4. Hypothyroidism. 5. Remote history of lymphoma. 6. Remote history of breast cancer. 7. Chronic low back pain related to sciatica and spinal stenosis being     worked up by her primary care practitioner and had an outpatient     MRI of her lumbosacral spine done on November 14, 2010.  DISCHARGE MEDICATIONS: 1. Lisinopril 5 mg 1 tablet daily. 2. Ultram 50 mg 1 tablet p.o. 3 times a day p.r.n. for intractable     pain. 3. Advil 200 mg 3 tablets p.o. 3 times a day. 4. Aspirin 81 mg p.o. daily. 5. Bisacodyl 5 mg 3 tablets p.o. q.h.s. 6. Bupropion XL 150 mg 1 tablet p.o. every morning. 7. Citracal plus 2 tablets p.o. twice daily. 8. Claritin 10 mg 1 tablet every morning. 9. Ferrous sulfate 325 mg 1 tablet p.o. daily. 10.Fish oil over-the-counter 1 capsule p.o. daily at 5 p.m. 11.ICaps Eye Vitamin 1 tablet p.o. daily. 12.MiraLax 17 g p.o. q.h.s. 13.Multivitamins 1 tablet p.o. daily. 14.Prilosec 20 mg 1 capsule p.o. daily. 15.Zocor 20 mg 1 tablet p.o. daily at 5 p.m. 16.Soothe 3 drops in right eye p.o. daily. 17.Synthroid 88 mcg 1 tablet p.o. every morning. 18.Valium 5 mg p.o. twice daily p.r.n.  CONSULTANTS ON THE CASE:  Dr. Catalina [name] from [name].  BRIEF HISTORY OF PRESENT ILLNESS:  The patient is an 77 year old white female who has a past medical history of dyslipidemia and a family history of coronary artery disease, has also a history of hypertension, but apparently is not on any medication, presented to the ED with left- sided chest pain.  She also apparently for the past couple of years, has been having worsening low back pain that is being worked up by her primary care practitioner.  She was admitted to the hospitalist service for further evaluation and treatment.  For further details, please see the history and physical that was dictated by Dr. Wolfgang [name] on admission.  PERTINENT RADIOLOGICAL STUDIES: 1. Lexiscan done on November 08, 2010, showed no evidence of inducible     myocardial ischemia.  Scar involving the inferior and inferior     septal walls.  Left ventricular dysfunction with calculated     ejection fraction of 39% and evidence of nearly dyskinetic inferior     wall motion. 2. X-ray of the chest two-view showed no acute abnormality, stable     large hiatal hernia, stable left basilar scarring.  PERTINENT LABORATORY DATA: 1. HbA1c is 5.5. 2. Cardiac  enzymes were cycled x3 and these are negative.  BRIEF HOSPITAL COURSE: 1. Chest pain.  The patient was admitted to the hospital for left-     sided chest pain.  Because the patient was not the best historian     and because of the fact that the patient has at least 3 risk     factors, Cardiology was consulted.  Cardiac enzymes are cycled and     these were negative.  A Lexiscan was subsequently done today, which     is negative for inducible ischemia.  However, it does show the     patient to have scar involving the inferior and inferior septal     walls with nearly dyskinetic inferior wall motion.  EF of 39% was     demonstrated by [name].  A 2-D echocardiogram was not done on     this admission.  I did speak with [name] from [name] regarding this [name] report and he recommended that     further outpatient workup can be done regarding this.  The     patient's contact information has been passed to [name] who     will arrange for an outpatient 2-D echo and further workup to be     done at [name].  All of this was explained to the patient     and she was told that [name] would contact the patient     for further continued care.  I will start the patient on low-dose     ACE inhibitor and subsequently discharge the patient for further     workup as an outpatient. 2. Chronic back pain.  Apparently, this patient has been having issues     with back pain for the past couple of years.  This has been worked     up by her primary care practitioner, Dr. Katrinka [name] and just prior to     hospitalization, Dr. Katrinka [name] had ordered an MRI of lumbosacral spine,     which did show a moderate-to-severe spinal stenosis at L4-L5 and     severe spinal stenosis at L5-S1.  At this time, the patient chooses     to discuss options with her primary care practitioner.  She is not     very keen on pursuing any surgical options that she has had     problems with anesthesia during her colonoscopies.  She is also not     keen on pursuing surgical options because she thinks that it would     not benefit her at her age and would like to talk it over with her     primary care doctor regarding epidural steroid injections.  At this     time, the patient is ambulating in the room without much     difficulty, although she claims that after she ambulates for half     an hour, she does have severe back pain and has to lean forward and     do flexion of her spine or lie down for the pain to go away.  The     patient has been prescribed Ultram to use for intractable pain as     well.  An appointment with her primary care doctor has been made by     me for 10:30 a.m. tomorrow, so that she could go and discuss her     options  with Dr. Katrinka [name].  The patient  is very keen in keeping     tomorrow's appointment and is being discharged home for further     workup to be continued as an outpatient.  Rest of her medical issues were stable.  FOLLOWUP INSTRUCTIONS: 1. The patient is to follow up with her primary care practitioner, Dr.     Dario Guardian at 10:30 a.m. on November 17, 2010.  An appointment     has been made for her already. 2. The patient will be contacted by [facility] for further     workup of possible ischemic cardiomyopathy.  A 2-D echocardiogram     will be arranged by Dana [name] at his office.  DIET INSTRUCTIONS:  The patient is advised to follow a heart-healthy diet.  DISPOSITION:  The patient will be discharged home with home health services.  Total time spent for discharge equals 45 minutes.     Dana Massed, MD     SG/MEDQ  D:  11/16/2010  T:  11/16/2010  Job:  [id number]  cc:   Dario Guardian, M.D. Fax: [phone]  Electronically Signed by Dana [name]  on 11/22/2010 03:32:16 PM

## 2010-12-02 ENCOUNTER — Inpatient Hospital Stay (HOSPITAL_COMMUNITY): Payer: Medicare Other

## 2010-12-02 ENCOUNTER — Ambulatory Visit (HOSPITAL_COMMUNITY)
Admission: RE | Admit: 2010-12-02 | Discharge: 2010-12-05 | Disposition: A | Discharge status: Home / Self Care | Payer: Medicare Other | Source: Ambulatory Visit | Attending: Neurosurgery | Admitting: Neurosurgery

## 2010-12-02 DIAGNOSIS — Z01812 Encounter for preprocedural laboratory examination: Secondary | ICD-10-CM | POA: Insufficient documentation

## 2010-12-02 DIAGNOSIS — M5137 Other intervertebral disc degeneration, lumbosacral region: Secondary | ICD-10-CM | POA: Insufficient documentation

## 2010-12-02 DIAGNOSIS — M51379 Other intervertebral disc degeneration, lumbosacral region without mention of lumbar back pain or lower extremity pain: Secondary | ICD-10-CM | POA: Insufficient documentation

## 2010-12-02 DIAGNOSIS — R339 Retention of urine, unspecified: Secondary | ICD-10-CM | POA: Insufficient documentation

## 2010-12-02 DIAGNOSIS — M47817 Spondylosis without myelopathy or radiculopathy, lumbosacral region: Secondary | ICD-10-CM | POA: Insufficient documentation

## 2010-12-02 DIAGNOSIS — M48061 Spinal stenosis, lumbar region without neurogenic claudication: Principal | ICD-10-CM | POA: Insufficient documentation

## 2010-12-02 LAB — BASIC METABOLIC PANEL
CO2: 25 mEq/L (ref 19–32)
Calcium: 10.1 mg/dL (ref 8.4–10.5)
Creatinine, Ser: 0.73 mg/dL (ref 0.50–1.10)
GFR calc non Af Amer: >60 mL/min (ref >60)
Glucose, Bld: 113 mg/dL — ABNORMAL HIGH (ref 70–99)
Sodium: 139 mEq/L (ref 135–145)

## 2010-12-02 LAB — CBC
MCH: 33.3 pg (ref 26.0–34.0)
MCHC: 36.2 g/dL — ABNORMAL HIGH (ref 30.0–36.0)
MCV: 91.7 fL (ref 78.0–100.0)
Platelets: 232 10*3/uL (ref 150–400)
RBC: 4.24 MIL/uL (ref 3.87–5.11)

## 2010-12-02 LAB — SURGICAL PCR SCREEN: MRSA, PCR: NEGATIVE

## 2010-12-14 NOTE — Op Note (Signed)
  NAMEALENI, Dana [name]               ACCOUNT NO.:  [id number]  MEDICAL RECORD NO.:  [id number]  LOCATION:  3533                         FACILITY:  [facility]  PHYSICIAN:  Danae Orleans. Venetia Maxon, M.D.  DATE OF BIRTH:  1928/01/15  DATE OF PROCEDURE:  12/02/2010 DATE OF DISCHARGE:                              OPERATIVE REPORT   PREOPERATIVE DIAGNOSES:  Lumbar spinal stenosis with spondylosis, degenerative disk disease, and radiculopathy L4-S1 levels.  POSTOPERATIVE DIAGNOSES:  Lumbar spinal stenosis with spondylosis, degenerative disk disease, and radiculopathy L4-S1 levels.  PROCEDURE:  L4-S1 decompressive lumbar laminectomy for spinal stenosis.  SURGEON:  Danae Orleans. Venetia Maxon, MD  ASSISTANT:  Georgiann Cocker, RN and Cristi Loron, MD  ANESTHESIA:  General endotracheal anesthesia.  ESTIMATED BLOOD LOSS:  Minimal.  COMPLICATIONS:  None.  DISPOSITION:  To Recovery.  INDICATIONS:  Dana [name] is an 77 year old woman with severe lumbar spinal stenosis at L4-5 and L5-S1 levels.  It was elected to take her to surgery for decompressive lumbar laminectomy for spinal stenosis.  PROCEDURE:  [name] was brought to the operating room.  Following satisfactory and uncomplicated induction of general endotracheal anesthesia plus intravenous lines, the patient was placed in prone position on the Wilson frame.  Her low back was prepped and draped in usual sterile fashion with ChloraPrep.  Area of planned incision was infiltrated with local lidocaine.  Incision was made in the midline, carried through the lumbodorsal dorsal fascia bilaterally exposing the L4-5 and L5-S1 interspaces.  These were highly degenerated with significant amount of osteophytic excrescences and spurring.  A laminectomy of L4 and L5 was performed with Leksell rongeur and high- speed drill after confirmatory radiograph demonstrated markers at the L4- 5 and L5-S1 interspace.  The bony decompression of the central canal  was completed with the 3-mm Kerrison rongeur and then subsequently lateral recesses initially on the right and subsequently on the left were decompressed with significant amount of spondylitic markedly degenerated material with facet capsule and ligamentum flavum which was adherent to the dura overlying the L5-S1 level bilaterally but worse on the left than the right and this was painstakingly dissected from the dura without violation of the dura and foraminotomies were performed with Kerrisons and also with reverse angled curettes.  In the end, I was able to easily insert a dilator probe at the L4, L5, and S1 neural foramina as well as cephalad and caudad and central canal.  Hemostasis was assured and wound was irrigated.  The lumbodorsal fascia was then closed with 0 Vicryl suture, subcutaneous tissues were approximated with 2-0 Vicryl interrupted inverted sutures.  Skin edges were approximated with 3-0 Vicryl subcu stitch. The wound was dressed with Dermabond.  The patient extubated in the operating and taken to recovery in stable satisfactory condition. Tolerated the operation well.  Counts were correct at the end of the case.     Danae Orleans. Venetia Maxon, M.D.     JDS/MEDQ  D:  12/02/2010  T:  12/03/2010  Job:  [id number]  Electronically Signed by Maeola Harman M.D. on 12/14/2010 10:37:17 AM

## 2010-12-15 NOTE — Consult Note (Signed)
NAMEMARILU, [name]               ACCOUNT NO.:  [id number]  MEDICAL RECORD NO.:  [id number]  LOCATION:  1445                         FACILITY:  [facility]  PHYSICIAN:  Corky Crafts, MDDATE OF BIRTH:  December 15, 1927  DATE OF CONSULTATION: DATE OF DISCHARGE:                                CONSULTATION   REASON FOR CONSULTATION:  Chest discomfort.  HISTORY OF PRESENT ILLNESS:  The patient is an 77 year old who has several risk factors for heart disease.  She has been having chest discomfort in the left side of her chest over the past 3-4 days; it feels like a pressure and a tightness at times, it has been occurring with rest, one time it was associated with nausea.  She does not exercise much because she has severe spinal stenosis.  She does do her activities of daily living.  She lives by herself.  This does not seem to bring on the pain.  She does have significant back and leg pain, however, with exercise.  Currently, in the hospital, she has no pain. She does not recall ever having a stress test in the past.  She has not had syncope or diaphoresis.  PAST MEDICAL HISTORY: 1. History of lymphoma. 2. History of breast cancer. 3. High cholesterol. 4. Anemia. 5. Diverticulitis. 6. Hiatal hernia. 7. Hypothyroidism. 8. Spinal stenosis.  HOME MEDICATIONS: 1. Synthroid 88 mcg daily. 2. Claritin 10 mg daily. 3. Prilosec. 4. Iron sulfate. 5. Calcium. 6. Omeprazole. 7. Simvastatin 20 mg daily. 8. Advil. 9. Valium. 10.Aspirin. 11.MiraLax. 12.Bisacodyl. 13.Citracal. 14.Iron. 15.Ferrous sulfate.  ALLERGIES:  To PHENERGAN, COMPAZINE, and possibly PENICILLIN.  PAST SURGICAL HISTORY:  Mastectomy.  SOCIAL HISTORY:  She lives alone.  She has five children.  She does not smoke.  She is widowed.  She says one child has had a drug problem, another child had an alcohol problem.  FAMILY HISTORY:  Significant for mother with coronary artery disease, father with prostate  cancer.  REVIEW OF SYSTEMS:  Significant for the chest discomfort as described above.  No shortness of breath.  She does have back pain and leg pain. No bleeding problems reported.  All other systems, negative.  PHYSICAL EXAMINATION:  VITAL SIGNS:  Blood pressure 127/76 and heart rate 74.  She is 95% on room air.  GENERAL:  She is awake. HEENT:  Head; normocephalic, atraumatic.  Eyes, extraocular movements intact. NECK:  No JVD. CARDIOVASCULAR:  Regular rate and rhythm, S1 and S2.  2/6 systolic ejection murmur.  Preserved S2. LUNGS:  Clear to auscultation bilaterally. ABDOMEN:  Soft, nontender. EXTREMITIES:  Showed no edema. NEURO:  No focal motor or sensory deficits grossly. PSYCH:  She does tend to go off on tangents when she speaks, will give a lot of information that does not pertain to the question.  LABORATORY DATA:  Lab work shows troponin negative.  Creatinine 0.6.  ASSESSMENT AND PLAN:  77 year old with atypical chest pain.  She is unable to walk to the treadmill.  PLAN: 1. We will plan for chemical stress test.  Her pain does not sound     cardiac.  She does have some risk factors for heart disease.  Given  the difficulty in getting history from her, it is also hard to     judge how typical her discomfort is based on that, would not     heparinize, she is ruled out.  We will plan of  Lexiscan     Cardiolite. 2. Continue simvastatin for hypertension. 3. Blood pressure has been elevated while she in the hospital.     Metoprolol was started.  Her blood pressure is better controlled     now. 4. Suspect she has some component of mild dementia given the way she     answers questions.     Corky Crafts, MD     JSV/MEDQ  D:  11/15/2010  T:  11/15/2010  Job:  [id number]  Electronically Signed by Lance Muss MD on 12/15/2010 01:10:43 PM

## 2011-01-06 ENCOUNTER — Other Ambulatory Visit: Payer: Self-pay | Admitting: Physician Assistant

## 2011-01-06 ENCOUNTER — Encounter (HOSPITAL_BASED_OUTPATIENT_CLINIC_OR_DEPARTMENT_OTHER): Payer: Medicare Other | Admitting: Oncology

## 2011-01-06 DIAGNOSIS — Z87898 Personal history of other specified conditions: Secondary | ICD-10-CM

## 2011-01-06 DIAGNOSIS — Z853 Personal history of malignant neoplasm of breast: Secondary | ICD-10-CM

## 2011-01-06 DIAGNOSIS — C8589 Other specified types of non-Hodgkin lymphoma, extranodal and solid organ sites: Secondary | ICD-10-CM

## 2011-01-06 LAB — COMPREHENSIVE METABOLIC PANEL
ALT: 14 U/L (ref 0–35)
AST: 21 U/L (ref 0–37)
Alkaline Phosphatase: 55 U/L (ref 39–117)
Calcium: 9.8 mg/dL (ref 8.4–10.5)
Chloride: 101 mEq/L (ref 96–112)
Creatinine, Ser: 0.9 mg/dL (ref 0.50–1.10)
Potassium: 4 mEq/L (ref 3.5–5.3)

## 2011-01-06 LAB — CBC WITH DIFFERENTIAL/PLATELET
BASO%: 0.6 % (ref 0.0–2.0)
Basophils Absolute: 0 10*3/uL (ref 0.0–0.1)
EOS%: 6.3 % (ref 0.0–7.0)
HCT: 36.4 % (ref 34.8–46.6)
HGB: 12.5 g/dL (ref 11.6–15.9)
MCH: 32.2 pg (ref 25.1–34.0)
MCHC: 34.3 g/dL (ref 31.5–36.0)
MCV: 93.8 fL (ref 79.5–101.0)
MONO%: 6.5 % (ref 0.0–14.0)
NEUT%: 64.7 % (ref 38.4–76.8)

## 2011-01-13 ENCOUNTER — Encounter: Payer: Medicare Other | Admitting: Oncology

## 2013-05-31 ENCOUNTER — Ambulatory Visit
Admission: RE | Admit: 2013-05-31 | Discharge: 2013-05-31 | Disposition: A | Discharge status: Home / Self Care | Payer: Medicare Other | Source: Ambulatory Visit | Attending: Family Medicine | Admitting: Family Medicine

## 2013-05-31 ENCOUNTER — Other Ambulatory Visit: Payer: Self-pay | Admitting: Family Medicine

## 2013-05-31 DIAGNOSIS — M549 Dorsalgia, unspecified: Secondary | ICD-10-CM

## 2013-10-16 ENCOUNTER — Encounter: Payer: Self-pay | Admitting: *Deleted

## 2014-03-14 ENCOUNTER — Emergency Department (HOSPITAL_COMMUNITY): Payer: Medicare Other

## 2014-03-14 ENCOUNTER — Emergency Department (HOSPITAL_COMMUNITY)
Admission: EM | Admit: 2014-03-14 | Discharge: 2014-03-14 | Disposition: A | Discharge status: Home / Self Care | Payer: Medicare Other | Attending: Emergency Medicine | Admitting: Emergency Medicine

## 2014-03-14 ENCOUNTER — Encounter (HOSPITAL_COMMUNITY): Payer: Self-pay

## 2014-03-14 DIAGNOSIS — Z8572 Personal history of non-Hodgkin lymphomas: Secondary | ICD-10-CM | POA: Diagnosis not present

## 2014-03-14 DIAGNOSIS — F419 Anxiety disorder, unspecified: Secondary | ICD-10-CM | POA: Insufficient documentation

## 2014-03-14 DIAGNOSIS — Z87891 Personal history of nicotine dependence: Secondary | ICD-10-CM | POA: Diagnosis not present

## 2014-03-14 DIAGNOSIS — Y998 Other external cause status: Secondary | ICD-10-CM | POA: Insufficient documentation

## 2014-03-14 DIAGNOSIS — S0993XA Unspecified injury of face, initial encounter: Secondary | ICD-10-CM | POA: Diagnosis present

## 2014-03-14 DIAGNOSIS — K219 Gastro-esophageal reflux disease without esophagitis: Secondary | ICD-10-CM | POA: Diagnosis not present

## 2014-03-14 DIAGNOSIS — Y9289 Other specified places as the place of occurrence of the external cause: Secondary | ICD-10-CM | POA: Diagnosis not present

## 2014-03-14 DIAGNOSIS — S50311A Abrasion of right elbow, initial encounter: Secondary | ICD-10-CM | POA: Diagnosis not present

## 2014-03-14 DIAGNOSIS — Z8711 Personal history of peptic ulcer disease: Secondary | ICD-10-CM | POA: Insufficient documentation

## 2014-03-14 DIAGNOSIS — W19XXXA Unspecified fall, initial encounter: Secondary | ICD-10-CM

## 2014-03-14 DIAGNOSIS — W01198A Fall on same level from slipping, tripping and stumbling with subsequent striking against other object, initial encounter: Secondary | ICD-10-CM | POA: Insufficient documentation

## 2014-03-14 DIAGNOSIS — G8929 Other chronic pain: Secondary | ICD-10-CM | POA: Insufficient documentation

## 2014-03-14 DIAGNOSIS — Z853 Personal history of malignant neoplasm of breast: Secondary | ICD-10-CM | POA: Insufficient documentation

## 2014-03-14 DIAGNOSIS — S299XXA Unspecified injury of thorax, initial encounter: Secondary | ICD-10-CM | POA: Diagnosis not present

## 2014-03-14 DIAGNOSIS — E039 Hypothyroidism, unspecified: Secondary | ICD-10-CM | POA: Diagnosis not present

## 2014-03-14 DIAGNOSIS — N644 Mastodynia: Secondary | ICD-10-CM

## 2014-03-14 DIAGNOSIS — M25561 Pain in right knee: Secondary | ICD-10-CM

## 2014-03-14 DIAGNOSIS — S0083XA Contusion of other part of head, initial encounter: Secondary | ICD-10-CM | POA: Insufficient documentation

## 2014-03-14 DIAGNOSIS — S81011A Laceration without foreign body, right knee, initial encounter: Secondary | ICD-10-CM | POA: Diagnosis not present

## 2014-03-14 DIAGNOSIS — F329 Major depressive disorder, single episode, unspecified: Secondary | ICD-10-CM | POA: Insufficient documentation

## 2014-03-14 DIAGNOSIS — H548 Legal blindness, as defined in USA: Secondary | ICD-10-CM | POA: Insufficient documentation

## 2014-03-14 DIAGNOSIS — E780 Pure hypercholesterolemia: Secondary | ICD-10-CM | POA: Diagnosis not present

## 2014-03-14 DIAGNOSIS — Y9389 Activity, other specified: Secondary | ICD-10-CM | POA: Diagnosis not present

## 2014-03-14 DIAGNOSIS — T07XXXA Unspecified multiple injuries, initial encounter: Secondary | ICD-10-CM

## 2014-03-14 DIAGNOSIS — T148XXA Other injury of unspecified body region, initial encounter: Secondary | ICD-10-CM

## 2014-03-14 DIAGNOSIS — N390 Urinary tract infection, site not specified: Secondary | ICD-10-CM

## 2014-03-14 LAB — I-STAT CHEM 8, ED
BUN: 15 mg/dL (ref 6–23)
Calcium, Ion: 1.17 mmol/L (ref 1.13–1.30)
Chloride: 106 mEq/L (ref 96–112)
Creatinine, Ser: 0.9 mg/dL (ref 0.50–1.10)
Glucose, Bld: 168 mg/dL — ABNORMAL HIGH (ref 70–99)
HCT: 43 % (ref 36.0–46.0)
Hemoglobin: 14.6 g/dL (ref 12.0–15.0)
Potassium: 3.5 mmol/L (ref 3.5–5.1)
Sodium: 143 mmol/L (ref 135–145)
TCO2: 21 mmol/L (ref 0–100)

## 2014-03-14 LAB — URINALYSIS, ROUTINE W REFLEX MICROSCOPIC
Bilirubin Urine: NEGATIVE
Glucose, UA: NEGATIVE mg/dL
Hgb urine dipstick: NEGATIVE
Ketones, ur: NEGATIVE mg/dL
Nitrite: NEGATIVE
Protein, ur: NEGATIVE mg/dL
SPECIFIC GRAVITY, URINE: 1.014 (ref 1.005–1.030)
Urobilinogen, UA: 0.2 mg/dL (ref 0.0–1.0)
pH: 7 (ref 5.0–8.0)

## 2014-03-14 LAB — URINE MICROSCOPIC-ADD ON

## 2014-03-14 MED ORDER — SULFAMETHOXAZOLE-TRIMETHOPRIM 800-160 MG PO TABS: 1.0000 | ORAL_TABLET | Freq: Two times a day (BID) | ORAL | Status: DC

## 2014-03-14 NOTE — ED Notes (Signed)
Bed: WA16 Expected date:  Expected time:  Means of arrival:  Comments: EMS fall 

## 2014-03-14 NOTE — ED Notes (Signed)
In to d/c patient, pt stating she will not use ice pack, she will not follow up with her physician this week. Pt states she has no help at home and can not walk good, 3 family members at bedside states pt lives at [facility] and they assist patient with appointments and care.

## 2014-03-14 NOTE — Discharge Instructions (Signed)
Stay very well hydrated with plenty of water throughout the day. Take antibiotic until completed for your urinary tract infection. Use ice to the areas of soreness, 20 minutes at a time every hour. Use tylenol or ibuprofen to help with pain. Use neosporin to your abrasions and keep them clean and dry, changing the bandage twice daily. Follow up with primary care physician in 1 week for recheck of ongoing symptoms but return to ER for emergent changing or worsening of symptoms. Please seek immediate care if you develop the following: You develop back pain.  Your symptoms are no better, or worse in 3 days. There is severe back pain or lower abdominal pain.  You develop chills.  You have a fever.  There is nausea or vomiting.  There is continued burning or discomfort with urination.     Contusion A contusion is a deep bruise. Contusions happen when an injury causes bleeding under the skin. Signs of bruising include pain, puffiness (swelling), and discolored skin. The contusion may turn blue, purple, or yellow. HOME CARE   Put ice on the injured area.  Put ice in a plastic bag.  Place a towel between your skin and the bag.  Leave the ice on for 15-20 minutes, 03-04 times a day.  Only take medicine as told by your doctor.  Rest the injured area.  If possible, raise (elevate) the injured area to lessen puffiness. GET HELP RIGHT AWAY IF:   You have more bruising or puffiness.  You have pain that is getting worse.  Your puffiness or pain is not helped by medicine. MAKE SURE YOU:   Understand these instructions.  Will watch your condition.  Will get help right away if you are not doing well or get worse. Document Released: 08/24/2007 Document Revised: 05/30/2011 Document Reviewed: 01/10/2011 [facility] Patient Information 2015 [place], [place]. This information is not intended to replace advice given to you by your health care provider. Make sure you discuss any questions you have with  your health care provider.  Facial or Scalp Contusion A facial or scalp contusion is a deep bruise on the face or head. Injuries to the face and head generally cause a lot of swelling, especially around the eyes. Contusions are the result of an injury that caused bleeding under the skin. The contusion may turn blue, purple, or yellow. Minor injuries will give you a painless contusion, but more severe contusions may stay painful and swollen for a few weeks.  CAUSES  A facial or scalp contusion is caused by a blunt injury or trauma to the face or head area.  SIGNS AND SYMPTOMS   Swelling of the injured area.   Discoloration of the injured area.   Tenderness, soreness, or pain in the injured area.  DIAGNOSIS  The diagnosis can be made by taking a medical history and doing a physical exam. An X-ray exam, CT scan, or MRI may be needed to determine if there are any associated injuries, such as broken bones (fractures). TREATMENT  Often, the best treatment for a facial or scalp contusion is applying cold compresses to the injured area. Over-the-counter medicines may also be recommended for pain control.  HOME CARE INSTRUCTIONS   Only take over-the-counter or prescription medicines as directed by your health care provider.   Apply ice to the injured area.   Put ice in a plastic bag.   Place a towel between your skin and the bag.   Leave the ice on for 20 minutes, 2-3  times a day.  SEEK MEDICAL CARE IF:  You have bite problems.   You have pain with chewing.   You are concerned about facial defects. SEEK IMMEDIATE MEDICAL CARE IF:  You have severe pain or a headache that is not relieved by medicine.   You have unusual sleepiness, confusion, or personality changes.   You throw up (vomit).   You have a persistent nosebleed.   You have double vision or blurred vision.   You have fluid drainage from your nose or ear.   You have difficulty walking or using your arms  or legs.  MAKE SURE YOU:   Understand these instructions.  Will watch your condition.  Will get help right away if you are not doing well or get worse. Document Released: 04/14/2004 Document Revised: 12/26/2012 Document Reviewed: 10/18/2012 [facility] Patient Information 2015 [place], [place]. This information is not intended to replace advice given to you by your health care provider. Make sure you discuss any questions you have with your health care provider.  Cryotherapy Cryotherapy is when you put ice on your injury. Ice helps lessen pain and puffiness (swelling) after an injury. Ice works the best when you start using it in the first 24 to 48 hours after an injury. HOME CARE  Put a dry or damp towel between the ice pack and your skin.  You may press gently on the ice pack.  Leave the ice on for no more than 10 to 20 minutes at a time.  Check your skin after 5 minutes to make sure your skin is okay.  Rest at least 20 minutes between ice pack uses.  Stop using ice when your skin loses feeling (numbness).  Do not use ice on someone who cannot tell you when it hurts. This includes small children and people with memory problems (dementia). GET HELP RIGHT AWAY IF:  You have white spots on your skin.  Your skin turns blue or pale.  Your skin feels waxy or hard.  Your puffiness gets worse. MAKE SURE YOU:   Understand these instructions.  Will watch your condition.  Will get help right away if you are not doing well or get worse. Document Released: 08/24/2007 Document Revised: 05/30/2011 Document Reviewed: 10/28/2010 [facility] Patient Information 2015 [place], [place]. This information is not intended to replace advice given to you by your health care provider. Make sure you discuss any questions you have with your health care provider.  Urinary Tract Infection Urinary tract infections (UTIs) can develop anywhere along your urinary tract. Your urinary tract is your body's  drainage system for removing wastes and extra water. Your urinary tract includes two kidneys, two ureters, a bladder, and a urethra. Your kidneys are a pair of bean-shaped organs. Each kidney is about the size of your fist. They are located below your ribs, one on each side of your spine. CAUSES Infections are caused by microbes, which are microscopic organisms, including fungi, viruses, and bacteria. These organisms are so small that they can only be seen through a microscope. Bacteria are the microbes that most commonly cause UTIs. SYMPTOMS  Symptoms of UTIs may vary by age and gender of the patient and by the location of the infection. Symptoms in young women typically include a frequent and intense urge to urinate and a painful, burning feeling in the bladder or urethra during urination. Older women and men are more likely to be tired, shaky, and weak and have muscle aches and abdominal pain. A fever may mean  the infection is in your kidneys. Other symptoms of a kidney infection include pain in your back or sides below the ribs, nausea, and vomiting. DIAGNOSIS To diagnose a UTI, your caregiver will ask you about your symptoms. Your caregiver also will ask to provide a urine sample. The urine sample will be tested for bacteria and white blood cells. White blood cells are made by your body to help fight infection. TREATMENT  Typically, UTIs can be treated with medication. Because most UTIs are caused by a bacterial infection, they usually can be treated with the use of antibiotics. The choice of antibiotic and length of treatment depend on your symptoms and the type of bacteria causing your infection. HOME CARE INSTRUCTIONS  If you were prescribed antibiotics, take them exactly as your caregiver instructs you. Finish the medication even if you feel better after you have only taken some of the medication.  Drink enough water and fluids to keep your urine clear or pale yellow.  Avoid caffeine, tea,  and carbonated beverages. They tend to irritate your bladder.  Empty your bladder often. Avoid holding urine for long periods of time.  Empty your bladder before and after sexual intercourse.  After a bowel movement, women should cleanse from front to back. Use each tissue only once. SEEK MEDICAL CARE IF:   You have back pain.  You develop a fever.  Your symptoms do not begin to resolve within 3 days. SEEK IMMEDIATE MEDICAL CARE IF:   You have severe back pain or lower abdominal pain.  You develop chills.  You have nausea or vomiting.  You have continued burning or discomfort with urination. MAKE SURE YOU:   Understand these instructions.  Will watch your condition.  Will get help right away if you are not doing well or get worse. Document Released: 12/15/2004 Document Revised: 09/06/2011 Document Reviewed: 04/15/2011 [facility] Patient Information 2015 [place], [place]. This information is not intended to replace advice given to you by your health care provider. Make sure you discuss any questions you have with your health care provider.  Fall Prevention and Home Safety Falls cause injuries and can affect all age groups. It is possible to use preventive measures to significantly decrease the likelihood of falls. There are many simple measures which can make your home safer and prevent falls. OUTDOORS  Repair cracks and edges of walkways and driveways.  Remove high doorway thresholds.  Trim shrubbery on the main path into your home.  Have good outside lighting.  Clear walkways of tools, rocks, debris, and clutter.  Check that handrails are not broken and are securely fastened. Both sides of steps should have handrails.  Have leaves, snow, and ice cleared regularly.  Use sand or salt on walkways during winter months.  In the garage, clean up grease or oil spills. BATHROOM  Install night lights.  Install grab bars by the toilet and in the tub and shower.  Use  non-skid mats or decals in the tub or shower.  Place a plastic non-slip stool in the shower to sit on, if needed.  Keep floors dry and clean up all water on the floor immediately.  Remove soap buildup in the tub or shower on a regular basis.  Secure bath mats with non-slip, double-sided rug tape.  Remove throw rugs and tripping hazards from the floors. BEDROOMS  Install night lights.  Make sure a bedside light is easy to reach.  Do not use oversized bedding.  Keep a telephone by your bedside.  Have a firm chair with side arms to use for getting dressed.  Remove throw rugs and tripping hazards from the floor. KITCHEN  Keep handles on pots and pans turned toward the center of the stove. Use back burners when possible.  Clean up spills quickly and allow time for drying.  Avoid walking on wet floors.  Avoid hot utensils and knives.  Position shelves so they are not too high or low.  Place commonly used objects within easy reach.  If necessary, use a sturdy step stool with a grab bar when reaching.  Keep electrical cables out of the way.  Do not use floor polish or wax that makes floors slippery. If you must use wax, use non-skid floor wax.  Remove throw rugs and tripping hazards from the floor. STAIRWAYS  Never leave objects on stairs.  Place handrails on both sides of stairways and use them. Fix any loose handrails. Make sure handrails on both sides of the stairways are as long as the stairs.  Check carpeting to make sure it is firmly attached along stairs. Make repairs to worn or loose carpet promptly.  Avoid placing throw rugs at the top or bottom of stairways, or properly secure the rug with carpet tape to prevent slippage. Get rid of throw rugs, if possible.  Have an electrician put in a light switch at the top and bottom of the stairs. OTHER FALL PREVENTION TIPS  Wear low-heel or rubber-soled shoes that are supportive and fit well. Wear closed toe  shoes.  When using a stepladder, make sure it is fully opened and both spreaders are firmly locked. Do not climb a closed stepladder.  Add color or contrast paint or tape to grab bars and handrails in your home. Place contrasting color strips on first and last steps.  Learn and use mobility aids as needed. Install an electrical emergency response system.  Turn on lights to avoid dark areas. Replace light bulbs that burn out immediately. Get light switches that glow.  Arrange furniture to create clear pathways. Keep furniture in the same place.  Firmly attach carpet with non-skid or double-sided tape.  Eliminate uneven floor surfaces.  Select a carpet pattern that does not visually hide the edge of steps.  Be aware of all pets. OTHER HOME SAFETY TIPS  Set the water temperature for 120 F (48.8 C).  Keep emergency numbers on or near the telephone.  Keep smoke detectors on every level of the home and near sleeping areas. Document Released: 02/25/2002 Document Revised: 09/06/2011 Document Reviewed: 05/27/2011 [facility] Patient Information 2015 [place], [place]. This information is not intended to replace advice given to you by your health care provider. Make sure you discuss any questions you have with your health care provider.

## 2014-03-14 NOTE — ED Notes (Signed)
Per EMS-was trying to get up on side walk with walker and fell on right side-right knee/elbow abrasion and swelling, pain to right breast, abrasions above and below right eye, no LOC-third time she has fallen this week-history of sciatica

## 2014-03-14 NOTE — ED Notes (Signed)
She states she fell in the parking lot of a local merchant this evening, landing on her right side.  She has an abrasion with ecchymosis at ant. Right knee.  She also has a sm. Abrasion at right elbow.  She has abrasion with edema and ecchymosis at right orbit area.  She denies l.o.c., nor any neck pain or discomfort.  She is alert and oriented x 4 with clear speech.  Her son stayed with her in constant attendance, not allowing her to get up after the fall until paramedics arrived.

## 2014-03-14 NOTE — ED Provider Notes (Signed)
Medical screening examination/treatment/procedure(s) were conducted as a shared visit with non-physician practitioner(s) and myself.  I personally evaluated the patient during the encounter.    Pt presents to the ED after falling in a parking lot.  She was using her walker when she went to turn and lost her balance.  Complains of pain on the right side of her face as well as her right knee. Physical Exam  BP 129/64 mmHg  Pulse 95  Temp(Src) 97.8 F (36.6 C) (Oral)  Resp 18  SpO2 95%  Physical Exam  Constitutional: She appears well-developed and well-nourished. No distress.  HENT:  Head: Normocephalic.  Right Ear: External ear normal.  Left Ear: External ear normal.  Hematoma right forehead adjacent to the eyebrow, ttp right zygomatic arch,  Eyes: Conjunctivae are normal. Right eye exhibits no discharge. Left eye exhibits no discharge. No scleral icterus.  Neck: Neck supple. No tracheal deviation present.  Cardiovascular: Normal rate.   Pulmonary/Chest: Effort normal. No stridor. No respiratory distress.  Musculoskeletal: She exhibits edema and tenderness.  Contusion and edema with ttp right patella  Neurological: She is alert. Cranial nerve deficit: no gross deficits.  Skin: Skin is warm and dry. No rash noted.  Psychiatric: She has a normal mood and affect.  Nursing note and vitals reviewed.   ED Course  Procedures EKG Interpretation Date/Time:  Friday March 14 2014 19:08:00 EST Ventricular Rate:  93 PR Interval:  166 QRS Duration: 112 QT Interval:  374 QTC Calculation: 465 R Axis:     Text Interpretation:  Sinus rhythm Left ventricular hypertrophy Borderline  T abnormalities, inferior leads inferior t wave changes increased since  prior ECG Confirmed by Kaulin Chaves  MD-J, Chirag Krueger ([id number]) on 03/14/2014 7:15:51 PM    MDM Most likely a mechanical fall.  Will check CT of head, face c spine and plain films.      Dorie Rank, MD 03/14/14 [phone]

## 2014-03-14 NOTE — ED Provider Notes (Signed)
CSN: [id number]     Arrival date & time 03/14/14  1807 History   First MD Initiated Contact with Patient 03/14/14 1810     Chief Complaint  Patient presents with  . Fall     (Consider location/radiation/quality/duration/timing/severity/associated sxs/prior Treatment) HPI Comments: Dana [name] is a 80 y.o. female with a PMHx of hypothyroidism, GERD, PUD, seasonal allergies, HLD, anxiety, depression, diverticulosis, L breast cancer, nonHodgkin's lymphoma, and chronic back pain with sciatica, who presents to the ED with complaints of mechanical fall prior to arrival. She reports that she was attempting to get her walker up on a sidewalk and turn, which resulted in her tripping because she states that her left leg that has sciatica has been "bothering her" and she was tired. When she fell she landed on her right side, striking her face and knee on the pavement. She denies any loss of consciousness. States that she is now having right cheek and right knee stinging, she is unable to describe the pain any further. States her R forehead and cheek are swollen with a bruise, and have abrasions. She reports that her right eye is painful when she moves the eye, and she also endorses right breast pain over the nipple where she struck it against the pavement. She states that she had nausea which is now resolved. She has had several falls over the last week, all which she attributes to tripping due to her sciatica. Denies fevers, dental malocclusion/loose teeth, ear pain/drainage, nosebleed, rhinorrhea, double vision, visual changes (although pt is legally blind), cough, CP, SOB, abd pain, vomiting, diarrhea, constipation, obstipation, melena, hematochezia, hematuria, dysuria, myalgias, confusion, AMS, weakness, paresthesias, numbness, dizziness, lightheadedness, vertigo, or syncope. Not on any blood thinners.  Patient is a 80 y.o. female presenting with fall and facial injury. The history is provided by the  patient. No language interpreter was used.  Fall This is a recurrent problem. The current episode started today. The problem occurs intermittently. The problem has been unchanged. Associated symptoms include arthralgias (R knee), joint swelling (R knee) and nausea (now resolved). Pertinent negatives include no abdominal pain, chest pain, coughing, fever, headaches, myalgias, neck pain, numbness, vertigo, visual change, vomiting or weakness. Nothing aggravates the symptoms. She has tried nothing for the symptoms. The treatment provided no relief.  Facial Injury Mechanism of injury:  Fall Location:  Forehead and R cheek Time since incident:  30 minutes Pain details:    Quality:  Unable to specify   Severity:  Unable to specify   Duration:  30 minutes   Timing:  Constant   Progression:  Unchanged Chronicity:  New Foreign body present:  No foreign bodies Relieved by:  None tried Worsened by:  Nothing tried Ineffective treatments:  None tried Associated symptoms: nausea (now resolved)   Associated symptoms: no altered mental status, no difficulty breathing, no double vision, no ear pain, no epistaxis, no headaches, no loss of consciousness, no malocclusion, no neck pain, no rhinorrhea, no trismus and no vomiting   Risk factors: frequent falls     Past Medical History  Diagnosis Date  . Hypothyroidism   . Esophageal reflux   . Seasonal allergies   . Hypercholesteremia   . Depression   . Anxiety   . History of diverticulitis of colon   . History of peptic ulcer   . History of non-Hodgkin's lymphoma   . History of breast cancer     Left Breast  . Chronic back pain    Past Surgical History  Procedure Laterality Date  . Abdominal hysterectomy    . Left mastectomy    . Laminectomy  2012   Family History  Problem Relation Age of Onset  . Diabetes Mother   . Heart disease Mother   . Heart attack Mother   . Cancer - Prostate Father    History  Substance Use Topics  . Smoking  status: Former Smoker    Types: Cigarettes  . Smokeless tobacco: Not on file  . Alcohol Use: No   OB History    No data available     Review of Systems  Constitutional: Negative for fever.  HENT: Positive for facial swelling. Negative for dental problem, ear discharge, ear pain, nosebleeds and rhinorrhea.   Eyes: Positive for pain (R eye, with movement). Negative for double vision, photophobia and visual disturbance (legally blind, no changes from baseline).  Respiratory: Negative for cough and shortness of breath.   Cardiovascular: Negative for chest pain.  Gastrointestinal: Positive for nausea (now resolved). Negative for vomiting, abdominal pain, diarrhea, constipation and blood in stool.  Genitourinary: Negative for dysuria and hematuria.  Musculoskeletal: Positive for joint swelling (R knee) and arthralgias (R knee). Negative for myalgias, back pain, neck pain and neck stiffness.  Skin: Positive for wound (abrasions).  Neurological: Negative for dizziness, vertigo, loss of consciousness, syncope, weakness, light-headedness, numbness and headaches.  Hematological: Does not bruise/bleed easily.  Psychiatric/Behavioral: Negative for confusion.   10 Systems reviewed and are negative for acute change except as noted in the HPI.    Allergies  Amoxicillin; Celexa; Claritin; Iohexol; and Prozac  Home Medications   Prior to Admission medications   Medication Sig Start Date End Date Taking? Authorizing Provider  buPROPion (WELLBUTRIN XL) 150 MG 24 hr tablet Take 150 mg by mouth daily.    Historical Provider, MD  Calcium-Magnesium-Zinc (V-R CALCIUM-MAGNESIUM-ZINC) 333-133-5 MG TABS Take 1,000 mg by mouth.    Historical Provider, MD  cephALEXin (KEFLEX) 500 MG capsule Take 500 mg by mouth 4 (four) times daily.    Historical Provider, MD  iron polysaccharides (NIFEREX) 150 MG capsule Take 150 mg by mouth daily.    Historical Provider, MD  levothyroxine (SYNTHROID) 88 MCG tablet Take 88  mcg by mouth daily before breakfast.    Historical Provider, MD  loratadine (CLARITIN) 10 MG tablet Take 10 mg by mouth daily.    Historical Provider, MD  Multiple Vitamins-Minerals (ICAPS) CAPS Take by mouth.    Historical Provider, MD  omeprazole (PRILOSEC) 20 MG capsule Take 20 mg by mouth daily.    Historical Provider, MD  polyethylene glycol powder (MIRALAX) powder Take 1 Container by mouth as directed.    Historical Provider, MD  simvastatin (ZOCOR) 20 MG tablet Take 20 mg by mouth daily.    Historical Provider, MD   BP 129/64 mmHg  Pulse 95  Temp(Src) 97.8 F (36.6 C) (Oral)  Resp 18  SpO2 95% Physical Exam  Constitutional: She is oriented to person, place, and time. Vital signs are normal. She appears well-developed and well-nourished.  Non-toxic appearance. No distress.  Afebrile, nontoxic, NAD  HENT:  Head: Normocephalic. Head is with abrasion and with contusion. Head is without raccoon's eyes, without Battle's sign and without right periorbital erythema.    Nose: Nose normal.  Mouth/Throat: Uvula is midline, oropharynx is clear and moist and mucous membranes are normal.  R forehead and R zygomatic arch with hematoma and abrasion, TTP without crepitus or stepoffs. No periorbital erythema, no racoon eyes, no battle's sign.  Scalp nonTTP without crepitus.  Eyes: Conjunctivae and EOM are normal. Pupils are equal, round, and reactive to light. Right eye exhibits no discharge. Left eye exhibits no discharge.  EOMI, PERRL, pt endorses pain with EOM Legally blind in b/l eyes  Neck: Normal range of motion. Neck supple. No spinous process tenderness and no muscular tenderness present. No rigidity. No edema, no erythema and normal range of motion present.  FROM intact without spinous process or paraspinous muscle TTP, no bony stepoffs or deformities, no muscle spasms. No rigidity or meningeal signs. No bruising or swelling.   Cardiovascular: Normal rate, regular rhythm, normal heart sounds  and intact distal pulses.  Exam reveals no gallop and no friction rub.   No murmur heard. Pulmonary/Chest: Effort normal and breath sounds normal. No respiratory distress. She has no decreased breath sounds. She has no wheezes. She has no rhonchi. She has no rales. She exhibits no bony tenderness, no crepitus, no deformity and no retraction. Right breast exhibits tenderness.    CTAB in all lung fields R breast TTP over nipple, no chest wall TTP or crepitus, no retraction  Abdominal: Soft. Normal appearance and bowel sounds are normal. She exhibits no distension. There is no tenderness. There is no rigidity, no rebound, no guarding, no tenderness at McBurney's point and negative Murphy's sign.  Soft, NTND, +BS throughout, no r/g/r  Musculoskeletal:       Right knee: She exhibits decreased range of motion (due to pain), swelling, ecchymosis and laceration (abrasion). She exhibits normal alignment, no LCL laxity, normal patellar mobility and no MCL laxity. Tenderness found.       Legs: MAE x4 Strength 5/5 in all extremities Sensation grossly intact in all extremities R knee with mildly decreased ROM due to pain, mild swelling with abrasion and contusion over patella, mild diffuse TTP throughout joint line and patella, no varus/valgus laxity, no abnormal alignment or patellar mobility R elbow with small abrasion, no bony TTP or deformity All spinal levels nonTTP without stepoffs or deformities  Neurological: She is alert and oriented to person, place, and time. She has normal strength. No cranial nerve deficit or sensory deficit. GCS eye subscore is 4. GCS verbal subscore is 5. GCS motor subscore is 6.  CN 2-12 grossly intact A&O x4 GCS 15 Sensation and strength intact Unable to assess gait and coordination due to injuries and legal blindness  Skin: Skin is warm and dry. Abrasion and bruising noted. No rash noted.  Abrasions and bruising as noted above  Psychiatric: She has a normal mood and  affect. Cognition and memory are normal.  Nursing note and vitals reviewed.   ED Course  Procedures (including critical care time) Labs Review Labs Reviewed  URINALYSIS, ROUTINE W REFLEX MICROSCOPIC - Abnormal; Notable for the following:    APPearance CLOUDY (*)    Leukocytes, UA LARGE (*)    All other components within normal limits  I-STAT CHEM 8, ED - Abnormal; Notable for the following:    Glucose, Bld 168 (*)    All other components within normal limits  URINE MICROSCOPIC-ADD ON    Imaging Review Dg Chest 2 View  03/14/2014   CLINICAL DATA:  Fall onto concrete. Right knee pain. Right-sided pain  EXAM: CHEST  2 VIEW  COMPARISON:  CT thorax 10/31/2006  FINDINGS: Normal cardiac silhouette. There is a large hiatal hernia noted. No effusion, infiltrate, pneumothorax. No evidence of fracture.  IMPRESSION: 1. No evidence of thoracic trauma. 2. Large hiatal hernia.  Electronically Signed   By: Suzy Bouchard M.D.   On: 03/14/2014 20:31   Ct Head Wo Contrast  03/14/2014   CLINICAL DATA:  Fall today striking right-sided face on the concrete. Right-sided facial pain and bruising  EXAM: CT HEAD WITHOUT CONTRAST  CT MAXILLOFACIAL WITHOUT CONTRAST  CT CERVICAL SPINE WITHOUT CONTRAST  TECHNIQUE: Multidetector CT imaging of the head, cervical spine, and maxillofacial structures were performed using the standard protocol without intravenous contrast. Multiplanar CT image reconstructions of the cervical spine and maxillofacial structures were also generated.  COMPARISON:  Head CT 10/31/2006  FINDINGS: CT HEAD FINDINGS  There is soft tissue swelling over the right orbit. No evidence of fracture. No intracranial hemorrhage. No parenchymal contusion. No midline shift or mass effect. Basilar cisterns are patent. No skull base fracture. No fluid in the paranasal sinuses or mastoid air cells. Orbits are normal.  There is generalized cortical atrophy. Mild periventricular white matter hypodensities.  CT  MAXILLOFACIAL FINDINGS  No evidence of orbital wall fracture. The intraconal contents are normal. No fluid in the maxillary sinuses. The pterygoid plates are intact. Mandibular condyles are located. No evidence of mandibular fracture. There is soft tissue swelling over the the lateral aspect of the right orbit. No fracture. No zygomatic arch fracture.  CT CERVICAL SPINE FINDINGS  No prevertebral soft tissue swelling. Normal alignment of cervical vertebral bodies. No loss of vertebral body height. Normal facet articulation. Normal craniocervical junction.  No evidence epidural or paraspinal hematoma.  There are multiple levels of endplate spurring and disc space narrowing of the lower cervical spine.  There is interlobular septal thickening in the upper lung.  IMPRESSION: 1. No intracranial trauma. 2. Atrophy and microvascular disease. 3. No facial bone trauma. 4. Soft tissue swelling over the right orbit without fracture. 5. NO cervical spine fracture. 6. Multilevel degenerative change of spine. 7. Interstitial edema in the lung apices.   Electronically Signed   By: Suzy Bouchard M.D.   On: 03/14/2014 21:07   Ct Cervical Spine Wo Contrast  03/14/2014   CLINICAL DATA:  Fall today striking right-sided face on the concrete. Right-sided facial pain and bruising  EXAM: CT HEAD WITHOUT CONTRAST  CT MAXILLOFACIAL WITHOUT CONTRAST  CT CERVICAL SPINE WITHOUT CONTRAST  TECHNIQUE: Multidetector CT imaging of the head, cervical spine, and maxillofacial structures were performed using the standard protocol without intravenous contrast. Multiplanar CT image reconstructions of the cervical spine and maxillofacial structures were also generated.  COMPARISON:  Head CT 10/31/2006  FINDINGS: CT HEAD FINDINGS  There is soft tissue swelling over the right orbit. No evidence of fracture. No intracranial hemorrhage. No parenchymal contusion. No midline shift or mass effect. Basilar cisterns are patent. No skull base fracture. No  fluid in the paranasal sinuses or mastoid air cells. Orbits are normal.  There is generalized cortical atrophy. Mild periventricular white matter hypodensities.  CT MAXILLOFACIAL FINDINGS  No evidence of orbital wall fracture. The intraconal contents are normal. No fluid in the maxillary sinuses. The pterygoid plates are intact. Mandibular condyles are located. No evidence of mandibular fracture. There is soft tissue swelling over the the lateral aspect of the right orbit. No fracture. No zygomatic arch fracture.  CT CERVICAL SPINE FINDINGS  No prevertebral soft tissue swelling. Normal alignment of cervical vertebral bodies. No loss of vertebral body height. Normal facet articulation. Normal craniocervical junction.  No evidence epidural or paraspinal hematoma.  There are multiple levels of endplate spurring and disc space narrowing of the lower cervical  spine.  There is interlobular septal thickening in the upper lung.  IMPRESSION: 1. No intracranial trauma. 2. Atrophy and microvascular disease. 3. No facial bone trauma. 4. Soft tissue swelling over the right orbit without fracture. 5. NO cervical spine fracture. 6. Multilevel degenerative change of spine. 7. Interstitial edema in the lung apices.   Electronically Signed   By: Suzy Bouchard M.D.   On: 03/14/2014 21:07   Dg Knee Complete 4 Views Right  03/14/2014   CLINICAL DATA:  Fall this evening  EXAM: RIGHT KNEE - COMPLETE 4+ VIEW  COMPARISON:  None.  FINDINGS: Osteopenia. Soft tissue injury involving the patellar tendon. No fracture. No dislocation. Mild tricompartment osteoarthritic change.  IMPRESSION: No acute bony injury per  Soft tissue injury to the patellar tendon is not excluded.   Electronically Signed   By: Maryclare Bean M.D.   On: 03/14/2014 20:29   Ct Maxillofacial Wo Cm  03/14/2014   CLINICAL DATA:  Fall today striking right-sided face on the concrete. Right-sided facial pain and bruising  EXAM: CT HEAD WITHOUT CONTRAST  CT MAXILLOFACIAL  WITHOUT CONTRAST  CT CERVICAL SPINE WITHOUT CONTRAST  TECHNIQUE: Multidetector CT imaging of the head, cervical spine, and maxillofacial structures were performed using the standard protocol without intravenous contrast. Multiplanar CT image reconstructions of the cervical spine and maxillofacial structures were also generated.  COMPARISON:  Head CT 10/31/2006  FINDINGS: CT HEAD FINDINGS  There is soft tissue swelling over the right orbit. No evidence of fracture. No intracranial hemorrhage. No parenchymal contusion. No midline shift or mass effect. Basilar cisterns are patent. No skull base fracture. No fluid in the paranasal sinuses or mastoid air cells. Orbits are normal.  There is generalized cortical atrophy. Mild periventricular white matter hypodensities.  CT MAXILLOFACIAL FINDINGS  No evidence of orbital wall fracture. The intraconal contents are normal. No fluid in the maxillary sinuses. The pterygoid plates are intact. Mandibular condyles are located. No evidence of mandibular fracture. There is soft tissue swelling over the the lateral aspect of the right orbit. No fracture. No zygomatic arch fracture.  CT CERVICAL SPINE FINDINGS  No prevertebral soft tissue swelling. Normal alignment of cervical vertebral bodies. No loss of vertebral body height. Normal facet articulation. Normal craniocervical junction.  No evidence epidural or paraspinal hematoma.  There are multiple levels of endplate spurring and disc space narrowing of the lower cervical spine.  There is interlobular septal thickening in the upper lung.  IMPRESSION: 1. No intracranial trauma. 2. Atrophy and microvascular disease. 3. No facial bone trauma. 4. Soft tissue swelling over the right orbit without fracture. 5. NO cervical spine fracture. 6. Multilevel degenerative change of spine. 7. Interstitial edema in the lung apices.   Electronically Signed   By: Suzy Bouchard M.D.   On: 03/14/2014 21:07     EKG Interpretation   Date/Time:   Friday March 14 2014 19:08:00 EST Ventricular Rate:  93 PR Interval:  166 QRS Duration: 112 QT Interval:  374 QTC Calculation: 465 R Axis:     Text Interpretation:  Sinus rhythm Left ventricular hypertrophy Borderline  T abnormalities, inferior leads inferior t wave changes increased since  prior ECG Confirmed by KNAPP  MD-J, JON ([id number]) on 03/14/2014 7:15:51 PM      MDM   Final diagnoses:  Fall  UTI (lower urinary tract infection)  Contusion  Abrasions of multiple sites  Right knee pain  Breast pain, right  Facial hematoma, initial encounter    80 y.o. female  with mechanical fall, but has had multiple falls in the last week. Has facial pain, R breast pain, R knee pain, and abrasions. A&O x4 with benign neuro exam, not on blood thinners. Will obtain U/A, chem 8, and EKG to evaluate for her frequent falls, and will obtain CT head/neck/maxillofacial, CXR, and R knee xray. Pt declines wanting pain medications. Will dress wounds. Will reassess shortly.  9:22 PM Chem 8 unremarkable aside from mildly elevated glucose. U/A showing large leuks, rare squamous, and TNTC WBC with clumps. No mention of bacteria but given these findings and her recent falls, will treat for UTI. CT head/maxillofacial/cervical spine showing no acute findings aside from soft tissue swelling over R orbit (without fracture). R knee xray negative for fracture/dislocation, doesn't exclude soft tissue injury to patellar tendon. Exam doesn't reveal abnormal patellar mobility, doubt rupture, more likely a contusion. CXR negative for acute findings. EKG with inferior T wave changes increased since prior EKG but overall unremarkable. Discussed with pt using tylenol/motrin for pain, pt declines wanting anything other than these. Discussed using ice to help with bruising/swelling. Will give bactrim for UTI. Discussed use of neosporin and keeping abrasions clean and dry. Will have her f/up with PCP in 1wk for eval. I explained  the diagnosis and have given explicit precautions to return to the ER including for any other new or worsening symptoms. The patient understands and accepts the medical plan as it's been dictated and I have answered their questions. Discharge instructions concerning home care and prescriptions have been given. The patient is STABLE and is discharged to home in good condition.  BP 123/56 mmHg  Pulse 94  Temp(Src) 97.8 F (36.6 C) (Oral)  Resp 17  SpO2 93%  Meds ordered this encounter  Medications  . sulfamethoxazole-trimethoprim (BACTRIM DS,SEPTRA DS) 800-160 MG per tablet    Sig: Take 1 tablet by mouth 2 (two) times daily.    Dispense:  14 tablet    Refill:  0    Order Specific Question:  Supervising Provider    Answer:  Johnna Acosta [address] Camprubi-Soms, PA-C 03/14/14 2142

## 2014-06-23 ENCOUNTER — Encounter (INDEPENDENT_AMBULATORY_CARE_PROVIDER_SITE_OTHER): Payer: Medicare Other | Admitting: Ophthalmology

## 2014-11-03 ENCOUNTER — Emergency Department (HOSPITAL_COMMUNITY)
Admission: EM | Admit: 2014-11-03 | Discharge: 2014-11-03 | Disposition: A | Discharge status: Home / Self Care | Payer: Medicare Other | Attending: Emergency Medicine | Admitting: Emergency Medicine

## 2014-11-03 ENCOUNTER — Encounter (HOSPITAL_COMMUNITY): Payer: Self-pay | Admitting: *Deleted

## 2014-11-03 DIAGNOSIS — E039 Hypothyroidism, unspecified: Secondary | ICD-10-CM | POA: Diagnosis not present

## 2014-11-03 DIAGNOSIS — Z87891 Personal history of nicotine dependence: Secondary | ICD-10-CM | POA: Insufficient documentation

## 2014-11-03 DIAGNOSIS — Z8711 Personal history of peptic ulcer disease: Secondary | ICD-10-CM | POA: Insufficient documentation

## 2014-11-03 DIAGNOSIS — G8929 Other chronic pain: Secondary | ICD-10-CM | POA: Diagnosis not present

## 2014-11-03 DIAGNOSIS — Z880 Allergy status to penicillin: Secondary | ICD-10-CM | POA: Diagnosis not present

## 2014-11-03 DIAGNOSIS — Z79899 Other long term (current) drug therapy: Secondary | ICD-10-CM | POA: Diagnosis not present

## 2014-11-03 DIAGNOSIS — F329 Major depressive disorder, single episode, unspecified: Secondary | ICD-10-CM | POA: Diagnosis not present

## 2014-11-03 DIAGNOSIS — Z8572 Personal history of non-Hodgkin lymphomas: Secondary | ICD-10-CM | POA: Diagnosis not present

## 2014-11-03 DIAGNOSIS — Z853 Personal history of malignant neoplasm of breast: Secondary | ICD-10-CM | POA: Insufficient documentation

## 2014-11-03 DIAGNOSIS — F419 Anxiety disorder, unspecified: Secondary | ICD-10-CM | POA: Diagnosis not present

## 2014-11-03 DIAGNOSIS — E780 Pure hypercholesterolemia: Secondary | ICD-10-CM | POA: Insufficient documentation

## 2014-11-03 DIAGNOSIS — R202 Paresthesia of skin: Secondary | ICD-10-CM | POA: Diagnosis not present

## 2014-11-03 DIAGNOSIS — K219 Gastro-esophageal reflux disease without esophagitis: Secondary | ICD-10-CM | POA: Diagnosis not present

## 2014-11-03 DIAGNOSIS — L299 Pruritus, unspecified: Secondary | ICD-10-CM | POA: Diagnosis present

## 2014-11-03 MED ORDER — HYDROXYZINE HCL 10 MG PO TABS: 10.0000 mg | ORAL_TABLET | Freq: Four times a day (QID) | ORAL | Status: DC | PRN

## 2014-11-03 MED ORDER — HYDROXYZINE HCL 10 MG PO TABS
10.0000 mg | ORAL_TABLET | Freq: Once | ORAL | Status: AC
  Administered 2014-11-03: 10 mg via ORAL
  Filled 2014-11-03: qty 1

## 2014-11-03 NOTE — ED Notes (Signed)
Bed: [facility] Expected date:  Expected time:  Means of arrival:  Comments: EMS- 81yo F, thinks bugs are biting her/no bugs noted

## 2014-11-03 NOTE — ED Notes (Signed)
Pt c/o invisible bugs crawling on her.  Per EMS, not bugs seen on patient or in home Pt lives at [facility]

## 2014-11-03 NOTE — ED Notes (Signed)
Hydroxyzine 10mg  ordered from pharmacy

## 2014-11-03 NOTE — ED Notes (Signed)
[facility] Psychologist, clinical Living) notified of patient's return to their facility.

## 2014-11-03 NOTE — Discharge Instructions (Signed)

## 2014-11-03 NOTE — ED Provider Notes (Signed)
CSN: [id number]     Arrival date & time 11/03/14  8338 History   First MD Initiated Contact with Patient 11/03/14 [phone]     Chief Complaint  Patient presents with  . Insect Bite    pt c/o "invisible bugs crawling all over her"   Dana [name] is a 81 y.o. female with a history of anxiety, depression, and allergies presents to the emergency department complaining of a sensation of invisible insects fighting her crawling on her body. Patient denies ever visualizing insects but she reports feeling them by her intermittently. She reports she believes they're coming from her couch. She has never seen any insects or seen any insect bites on her body. She reports that the chest staying in their all over her body including her nose and legs. Patient reports feeling anxious about the bug bites. The patient lives in independent living at [place] [facility]. She reports the sensation is been ongoing for the past 3 weeks. I called and spoke with her son Nola Botkins he reports is been ongoing for 3 weeks and is happened previously. He denies having any history of schizophrenia or mental illness other than anxiety. The patient denies fevers, chills, recent illness, abdominal pain, nausea, vomiting, rashes, urinary symptoms, chest pain, shortness of breath, cough or wheezing. She denies AH or VH.   (Consider location/radiation/quality/duration/timing/severity/associated sxs/prior Treatment) HPI  Past Medical History  Diagnosis Date  . Hypothyroidism   . Esophageal reflux   . Seasonal allergies   . Hypercholesteremia   . Depression   . Anxiety   . History of diverticulitis of colon   . History of peptic ulcer   . History of non-Hodgkin's lymphoma   . History of breast cancer     Left Breast  . Chronic back pain   . Cancer    Past Surgical History  Procedure Laterality Date  . Abdominal hysterectomy    . Left mastectomy    . Laminectomy  2012   Family History  Problem Relation Age of Onset  .  Diabetes Mother   . Heart disease Mother   . Heart attack Mother   . Cancer - Prostate Father    Social History  Substance Use Topics  . Smoking status: Former Smoker    Types: Cigarettes  . Smokeless tobacco: None  . Alcohol Use: No   OB History    No data available     Review of Systems  Constitutional: Negative for fever and chills.  HENT: Negative for congestion and sore throat.   Eyes: Negative for pain, discharge and visual disturbance.  Respiratory: Negative for cough and shortness of breath.   Cardiovascular: Negative for chest pain.  Gastrointestinal: Negative for nausea, vomiting and abdominal pain.  Genitourinary: Negative for dysuria.  Musculoskeletal: Negative for back pain and neck pain.  Skin: Negative for color change, rash and wound.       Itching, bug sensation   Neurological: Negative for weakness, numbness and headaches.      Allergies  Amoxicillin; Celexa; Iohexol; Prozac; Promethazine; and Shrimp  Home Medications   Prior to Admission medications   Medication Sig Start Date End Date Taking? Authorizing Provider  buPROPion (WELLBUTRIN XL) 150 MG 24 hr tablet Take 300 mg by mouth daily.    Yes Historical Provider, MD  levothyroxine (SYNTHROID) 88 MCG tablet Take 88 mcg by mouth daily before breakfast.   Yes Historical Provider, MD  Calcium-Magnesium-Zinc (V-R CALCIUM-MAGNESIUM-ZINC) 333-133-5 MG TABS Take 1,000 mg by mouth.  Historical Provider, MD  cephALEXin (KEFLEX) 500 MG capsule Take 500 mg by mouth 4 (four) times daily.    Historical Provider, MD  diazepam (VALIUM) 5 MG tablet Take 5 mg by mouth daily as needed for anxiety or muscle spasms (and nausea).    Historical Provider, MD  hydrOXYzine (ATARAX/VISTARIL) 10 MG tablet Take 1 tablet (10 mg total) by mouth every 6 (six) hours as needed for itching. 11/03/14   Waynetta Pean, PA-C  iron polysaccharides (NIFEREX) 150 MG capsule Take 150 mg by mouth daily.    Historical Provider, MD  loratadine  (CLARITIN) 10 MG tablet Take 10 mg by mouth daily.    Historical Provider, MD  Multiple Vitamins-Minerals (ICAPS) CAPS Take by mouth.    Historical Provider, MD  omeprazole (PRILOSEC) 20 MG capsule Take 20 mg by mouth daily with breakfast.     Historical Provider, MD  polyethylene glycol powder (MIRALAX) powder Take 1 Container by mouth as directed.    Historical Provider, MD  simvastatin (ZOCOR) 20 MG tablet Take 20 mg by mouth daily.    Historical Provider, MD  sulfamethoxazole-trimethoprim (BACTRIM DS,SEPTRA DS) 800-160 MG per tablet Take 1 tablet by mouth 2 (two) times daily. Patient not taking: Reported on 11/03/2014 03/14/14   Mercedes Camprubi-Soms, PA-C   BP 156/73 mmHg  Pulse 93  Temp(Src) 98.2 F (36.8 C) (Oral)  Resp 20  SpO2 93% Physical Exam  Constitutional: She is oriented to person, place, and time. She appears well-developed and well-nourished. No distress.  Nontoxic-appearing.  HENT:  Head: Normocephalic and atraumatic.  Right Ear: External ear normal.  Left Ear: External ear normal.  Mouth/Throat: Oropharynx is clear and moist.  Eyes: Conjunctivae are normal. Pupils are equal, round, and reactive to light. Right eye exhibits no discharge. Left eye exhibits no discharge.  Neck: Neck supple.  Cardiovascular: Normal rate, regular rhythm, normal heart sounds and intact distal pulses.   Pulmonary/Chest: Effort normal and breath sounds normal. No respiratory distress. She has no wheezes. She has no rales.  Abdominal: Soft. There is no tenderness. There is no guarding.  Lymphadenopathy:    She has no cervical adenopathy.  Neurological: She is alert and oriented to person, place, and time. Coordination normal.  The patient is alert and oriented 3.  Skin: Skin is warm and dry. No rash noted. She is not diaphoretic. No erythema. No pallor.  No evidence of any insect bites or rashes to her body. No excoriations from scratching. No evidence of any insects on her body.   Psychiatric: Her speech is normal and behavior is normal. Her mood appears anxious. She is not withdrawn and not actively hallucinating. She does not exhibit a depressed mood.  Patient appears anxious. She endorses anxiety. She denies AH or VH.   Nursing note and vitals reviewed.   ED Course  Procedures (including critical care time) Labs Review Labs Reviewed - No data to display  Imaging Review No results found.       Filed Vitals:   11/03/14 0828  BP: 156/73  Pulse: 93  Temp: 98.2 F (36.8 C)  TempSrc: Oral  Resp: 20  SpO2: 93%     MDM   Meds given in ED:  Medications  hydrOXYzine (ATARAX/VISTARIL) tablet 10 mg (not administered)    New Prescriptions   HYDROXYZINE (ATARAX/VISTARIL) 10 MG TABLET    Take 1 tablet (10 mg total) by mouth every 6 (six) hours as needed for itching.    Final diagnoses:  Formication  Patient presents to the emergency department complaining of a sensation of insects crawling and biting her all over her body for the past 3 weeks. She presents from [facility] independent living. The patient has not visualized any insects on her body or any insect bites. Patient reports that her caretakers also examined her and the couch with no finding of any insects. On exam the patient is afebrile and nontoxic appearing. There are no rashes or insect bites on her body. No excoriations from scratching. Patient denies visual or auditory hallucinations. I called and spoke with her son Aiyana Stegmann reports that she has positive anxiety and has worried about insects on her couch previously. We'll provide the patient with Vistaril and have close follow-up with primary care provider. I encouraged further investigation of the couch to ensure there are no insects on it. I advised the patient to follow-up with their primary care provider this week. I advised the patient to return to the emergency department with new or worsening symptoms or new concerns. The patient  verbalized understanding and agreement with plan.    This patient was discussed with and evaluated by Dr. Betsey Holiday who agrees with assessment and plan.     Waynetta Pean, PA-C 11/03/14 1062  Orpah Greek, MD 11/03/14 1012

## 2014-11-03 NOTE — ED Provider Notes (Signed)
Patient presented to the ER with complaints of visible but makes crawling on her and biting her  Face to face Exam: HEENT - PERRLA Lungs - CTAB Heart - RRR, no M/R/G Abd - S/NT/ND Neuro - alert, oriented x3  Plan: Patient appears anxious. No visible bites noted. Patient stable for return to home, follow-up with primary care.  Orpah Greek, MD 11/03/14 [phone]

## 2015-02-24 ENCOUNTER — Emergency Department (HOSPITAL_COMMUNITY): Payer: Medicare Other

## 2015-02-24 ENCOUNTER — Inpatient Hospital Stay (HOSPITAL_COMMUNITY)
Admission: EM | Admit: 2015-02-24 | Discharge: 2015-03-04 | DRG: 193 | Disposition: A | Discharge status: Skilled Nursing Facility | Payer: Medicare Other | Attending: Internal Medicine | Admitting: Internal Medicine

## 2015-02-24 ENCOUNTER — Encounter (HOSPITAL_COMMUNITY): Payer: Self-pay | Admitting: Emergency Medicine

## 2015-02-24 DIAGNOSIS — F22 Delusional disorders: Secondary | ICD-10-CM | POA: Diagnosis present

## 2015-02-24 DIAGNOSIS — M549 Dorsalgia, unspecified: Secondary | ICD-10-CM | POA: Diagnosis not present

## 2015-02-24 DIAGNOSIS — Z87891 Personal history of nicotine dependence: Secondary | ICD-10-CM

## 2015-02-24 DIAGNOSIS — C8590 Non-Hodgkin lymphoma, unspecified, unspecified site: Secondary | ICD-10-CM | POA: Diagnosis present

## 2015-02-24 DIAGNOSIS — Z8711 Personal history of peptic ulcer disease: Secondary | ICD-10-CM | POA: Insufficient documentation

## 2015-02-24 DIAGNOSIS — Z853 Personal history of malignant neoplasm of breast: Secondary | ICD-10-CM | POA: Diagnosis not present

## 2015-02-24 DIAGNOSIS — E7800 Pure hypercholesterolemia, unspecified: Secondary | ICD-10-CM | POA: Diagnosis present

## 2015-02-24 DIAGNOSIS — Y95 Nosocomial condition: Secondary | ICD-10-CM | POA: Diagnosis present

## 2015-02-24 DIAGNOSIS — F419 Anxiety disorder, unspecified: Secondary | ICD-10-CM | POA: Diagnosis present

## 2015-02-24 DIAGNOSIS — J9690 Respiratory failure, unspecified, unspecified whether with hypoxia or hypercapnia: Secondary | ICD-10-CM

## 2015-02-24 DIAGNOSIS — R05 Cough: Secondary | ICD-10-CM | POA: Diagnosis present

## 2015-02-24 DIAGNOSIS — T501X1A Poisoning by loop [high-ceiling] diuretics, accidental (unintentional), initial encounter: Secondary | ICD-10-CM | POA: Diagnosis not present

## 2015-02-24 DIAGNOSIS — R296 Repeated falls: Secondary | ICD-10-CM

## 2015-02-24 DIAGNOSIS — R0603 Acute respiratory distress: Secondary | ICD-10-CM

## 2015-02-24 DIAGNOSIS — E876 Hypokalemia: Secondary | ICD-10-CM | POA: Diagnosis not present

## 2015-02-24 DIAGNOSIS — J189 Pneumonia, unspecified organism: Principal | ICD-10-CM | POA: Insufficient documentation

## 2015-02-24 DIAGNOSIS — Z79899 Other long term (current) drug therapy: Secondary | ICD-10-CM | POA: Diagnosis not present

## 2015-02-24 DIAGNOSIS — R52 Pain, unspecified: Secondary | ICD-10-CM

## 2015-02-24 DIAGNOSIS — E038 Other specified hypothyroidism: Secondary | ICD-10-CM | POA: Diagnosis not present

## 2015-02-24 DIAGNOSIS — M5430 Sciatica, unspecified side: Secondary | ICD-10-CM | POA: Diagnosis present

## 2015-02-24 DIAGNOSIS — I5021 Acute systolic (congestive) heart failure: Secondary | ICD-10-CM | POA: Diagnosis not present

## 2015-02-24 DIAGNOSIS — R000 Tachycardia, unspecified: Secondary | ICD-10-CM | POA: Diagnosis present

## 2015-02-24 DIAGNOSIS — E039 Hypothyroidism, unspecified: Secondary | ICD-10-CM | POA: Diagnosis present

## 2015-02-24 DIAGNOSIS — K219 Gastro-esophageal reflux disease without esophagitis: Secondary | ICD-10-CM | POA: Diagnosis present

## 2015-02-24 DIAGNOSIS — F329 Major depressive disorder, single episode, unspecified: Secondary | ICD-10-CM | POA: Diagnosis present

## 2015-02-24 DIAGNOSIS — I5041 Acute combined systolic (congestive) and diastolic (congestive) heart failure: Secondary | ICD-10-CM | POA: Diagnosis not present

## 2015-02-24 DIAGNOSIS — E860 Dehydration: Secondary | ICD-10-CM | POA: Diagnosis present

## 2015-02-24 DIAGNOSIS — J9601 Acute respiratory failure with hypoxia: Secondary | ICD-10-CM | POA: Diagnosis not present

## 2015-02-24 DIAGNOSIS — I509 Heart failure, unspecified: Secondary | ICD-10-CM

## 2015-02-24 DIAGNOSIS — Z66 Do not resuscitate: Secondary | ICD-10-CM | POA: Diagnosis present

## 2015-02-24 DIAGNOSIS — G8929 Other chronic pain: Secondary | ICD-10-CM | POA: Diagnosis present

## 2015-02-24 HISTORY — DX: Personal history of other diseases of the digestive system: Z87.19

## 2015-02-24 HISTORY — DX: Migraine, unspecified, not intractable, without status migrainosus: G43.909

## 2015-02-24 HISTORY — DX: Unspecified osteoarthritis, unspecified site: M19.90

## 2015-02-24 HISTORY — DX: Cardiac murmur, unspecified: R01.1

## 2015-02-24 HISTORY — DX: Basal cell carcinoma of skin of nose: C44.311

## 2015-02-24 HISTORY — DX: Non-Hodgkin lymphoma, unspecified, unspecified site: C85.90

## 2015-02-24 HISTORY — DX: Other specified postprocedural states: Z98.890

## 2015-02-24 HISTORY — DX: Cystocele, unspecified: N81.10

## 2015-02-24 HISTORY — DX: Malignant neoplasm of unspecified site of left female breast: C50.912

## 2015-02-24 LAB — CBC WITH DIFFERENTIAL/PLATELET
Basophils Absolute: 0 10*3/uL (ref 0.0–0.1)
Basophils Relative: 0 %
EOS PCT: 2 %
Eosinophils Absolute: 0.3 10*3/uL (ref 0.0–0.7)
HCT: 39.3 % (ref 36.0–46.0)
Hemoglobin: 13.2 g/dL (ref 12.0–15.0)
LYMPHS ABS: 1.1 10*3/uL (ref 0.7–4.0)
LYMPHS PCT: 8 %
MCH: 33.3 pg (ref 26.0–34.0)
MCHC: 33.6 g/dL (ref 30.0–36.0)
MCV: 99.2 fL (ref 78.0–100.0)
MONO ABS: 1 10*3/uL (ref 0.1–1.0)
Monocytes Relative: 8 %
Neutro Abs: 10.8 10*3/uL — ABNORMAL HIGH (ref 1.7–7.7)
Neutrophils Relative %: 82 %
PLATELETS: 258 10*3/uL (ref 150–400)
RBC: 3.96 MIL/uL (ref 3.87–5.11)
RDW: 13.9 % (ref 11.5–15.5)
WBC: 13.3 10*3/uL — ABNORMAL HIGH (ref 4.0–10.5)

## 2015-02-24 LAB — TSH: TSH: 0.674 u[IU]/mL (ref 0.350–4.500)

## 2015-02-24 LAB — URINALYSIS, ROUTINE W REFLEX MICROSCOPIC
Bilirubin Urine: NEGATIVE
GLUCOSE, UA: NEGATIVE mg/dL
Hgb urine dipstick: NEGATIVE
Ketones, ur: NEGATIVE mg/dL
Leukocytes, UA: NEGATIVE
Nitrite: NEGATIVE
PROTEIN: NEGATIVE mg/dL
Specific Gravity, Urine: 1.017 (ref 1.005–1.030)
pH: 6.5 (ref 5.0–8.0)

## 2015-02-24 LAB — BASIC METABOLIC PANEL
Anion gap: 8 (ref 5–15)
BUN: 14 mg/dL (ref 6–20)
CO2: 26 mmol/L (ref 22–32)
Calcium: 9.3 mg/dL (ref 8.9–10.3)
Chloride: 105 mmol/L (ref 101–111)
Creatinine, Ser: 0.8 mg/dL (ref 0.44–1.00)
GFR calc Af Amer: >60 mL/min (ref >60)
Glucose, Bld: 122 mg/dL — ABNORMAL HIGH (ref 65–99)
POTASSIUM: 4.4 mmol/L (ref 3.5–5.1)
Sodium: 139 mmol/L (ref 135–145)

## 2015-02-24 LAB — I-STAT CG4 LACTIC ACID, ED: LACTIC ACID, VENOUS: 1.2 mmol/L (ref 0.5–2.0)

## 2015-02-24 MED ORDER — ALBUTEROL SULFATE (2.5 MG/3ML) 0.083% IN NEBU
5.0000 mg | INHALATION_SOLUTION | Freq: Once | RESPIRATORY_TRACT | Status: AC
  Administered 2015-02-24: 5 mg via RESPIRATORY_TRACT
  Filled 2015-02-24: qty 6

## 2015-02-24 MED ORDER — LEVOFLOXACIN IN D5W 750 MG/150ML IV SOLN
750.0000 mg | INTRAVENOUS | Status: DC
Start: 2015-02-24 — End: 2015-02-25
  Administered 2015-02-24: 750 mg via INTRAVENOUS
  Filled 2015-02-24 (×2): qty 150

## 2015-02-24 MED ORDER — ALBUTEROL SULFATE (2.5 MG/3ML) 0.083% IN NEBU
2.5000 mg | INHALATION_SOLUTION | Freq: Three times a day (TID) | RESPIRATORY_TRACT | Status: DC
  Administered 2015-02-25 – 2015-03-04 (×22): 2.5 mg via RESPIRATORY_TRACT
  Filled 2015-02-24 (×23): qty 3

## 2015-02-24 MED ORDER — CALCIUM CARBONATE 1250 (500 CA) MG PO TABS
0.5000 | ORAL_TABLET | Freq: Every day | ORAL | Status: DC
  Administered 2015-02-24 – 2015-03-03 (×7): 250 mg via ORAL
  Filled 2015-02-24 (×7): qty 1

## 2015-02-24 MED ORDER — SIMVASTATIN 20 MG PO TABS
20.0000 mg | ORAL_TABLET | Freq: Every day | ORAL | Status: DC
  Administered 2015-02-24 – 2015-03-03 (×7): 20 mg via ORAL
  Filled 2015-02-24 (×7): qty 1

## 2015-02-24 MED ORDER — ALBUTEROL (5 MG/ML) CONTINUOUS INHALATION SOLN
10.0000 mg/h | INHALATION_SOLUTION | Freq: Once | RESPIRATORY_TRACT | Status: DC
  Filled 2015-02-24: qty 20

## 2015-02-24 MED ORDER — PANTOPRAZOLE SODIUM 40 MG PO TBEC
40.0000 mg | DELAYED_RELEASE_TABLET | Freq: Every day | ORAL | Status: DC
  Administered 2015-02-24 – 2015-03-04 (×8): 40 mg via ORAL
  Filled 2015-02-24 (×8): qty 1

## 2015-02-24 MED ORDER — ONDANSETRON HCL 4 MG/2ML IJ SOLN
4.0000 mg | Freq: Once | INTRAMUSCULAR | Status: AC
  Administered 2015-02-24: 4 mg via INTRAVENOUS
  Filled 2015-02-24: qty 2

## 2015-02-24 MED ORDER — LEVOTHYROXINE SODIUM 88 MCG PO TABS
88.0000 ug | ORAL_TABLET | Freq: Every day | ORAL | Status: DC
  Administered 2015-02-25: 88 ug via ORAL
  Filled 2015-02-24: qty 1

## 2015-02-24 MED ORDER — BUPROPION HCL ER (XL) 300 MG PO TB24
300.0000 mg | ORAL_TABLET | Freq: Every day | ORAL | Status: DC
  Administered 2015-02-24 – 2015-03-04 (×8): 300 mg via ORAL
  Filled 2015-02-24 (×10): qty 1

## 2015-02-24 MED ORDER — POLYETHYLENE GLYCOL 3350 17 GM/SCOOP PO POWD
1.0000 | Freq: Every day | ORAL | Status: DC | PRN
  Filled 2015-02-24: qty 255

## 2015-02-24 MED ORDER — DIAZEPAM 5 MG PO TABS
5.0000 mg | ORAL_TABLET | Freq: Every day | ORAL | Status: DC | PRN
Start: 2015-02-24 — End: 2015-03-04
  Administered 2015-02-26 – 2015-03-01 (×2): 5 mg via ORAL
  Filled 2015-02-24 (×4): qty 1

## 2015-02-24 MED ORDER — ALBUTEROL SULFATE (2.5 MG/3ML) 0.083% IN NEBU
2.5000 mg | INHALATION_SOLUTION | Freq: Four times a day (QID) | RESPIRATORY_TRACT | Status: DC
  Administered 2015-02-24: 2.5 mg via RESPIRATORY_TRACT
  Filled 2015-02-24: qty 3

## 2015-02-24 MED ORDER — DEXTROSE 5 % IV SOLN
2.0000 g | Freq: Once | INTRAVENOUS | Status: AC
  Administered 2015-02-24: 2 g via INTRAVENOUS
  Filled 2015-02-24: qty 2

## 2015-02-24 MED ORDER — POLYSACCHARIDE IRON COMPLEX 150 MG PO CAPS
150.0000 mg | ORAL_CAPSULE | Freq: Every day | ORAL | Status: DC
  Administered 2015-02-24 – 2015-03-04 (×8): 150 mg via ORAL
  Filled 2015-02-24 (×10): qty 1

## 2015-02-24 MED ORDER — VANCOMYCIN HCL 10 G IV SOLR
1500.0000 mg | Freq: Once | INTRAVENOUS | Status: AC
  Administered 2015-02-24: 1500 mg via INTRAVENOUS
  Filled 2015-02-24: qty 1500

## 2015-02-24 MED ORDER — DIAZEPAM 5 MG PO TABS
5.0000 mg | ORAL_TABLET | Freq: Once | ORAL | Status: AC
  Administered 2015-02-24: 5 mg via ORAL
  Filled 2015-02-24: qty 1

## 2015-02-24 MED ORDER — ENOXAPARIN SODIUM 30 MG/0.3ML ~~LOC~~ SOLN
30.0000 mg | SUBCUTANEOUS | Status: DC
  Administered 2015-02-24: 30 mg via SUBCUTANEOUS
  Filled 2015-02-24: qty 0.3

## 2015-02-24 MED ORDER — SODIUM CHLORIDE 0.9 % IV BOLUS (SEPSIS)
500.0000 mL | Freq: Once | INTRAVENOUS | Status: AC
  Administered 2015-02-24: 500 mL via INTRAVENOUS

## 2015-02-24 MED ORDER — METHYLPREDNISOLONE SODIUM SUCC 125 MG IJ SOLR
125.0000 mg | Freq: Once | INTRAMUSCULAR | Status: AC
  Administered 2015-02-24: 125 mg via INTRAVENOUS
  Filled 2015-02-24: qty 2

## 2015-02-24 MED ORDER — CEFEPIME HCL 2 G IJ SOLR
2.0000 g | Freq: Once | INTRAMUSCULAR | Status: DC
  Filled 2015-02-24: qty 2

## 2015-02-24 MED ORDER — HYDROXYZINE HCL 10 MG PO TABS
10.0000 mg | ORAL_TABLET | Freq: Four times a day (QID) | ORAL | Status: DC | PRN
  Filled 2015-02-24: qty 1

## 2015-02-24 MED ORDER — SODIUM CHLORIDE 0.9 % IV SOLN
INTRAVENOUS | Status: DC
  Administered 2015-02-24 – 2015-02-25 (×2): via INTRAVENOUS

## 2015-02-24 MED ORDER — CALCIUM CITRATE 950 (200 CA) MG PO TABS: 200.0000 mg | ORAL_TABLET | Freq: Every day | ORAL | Status: DC

## 2015-02-24 MED ORDER — VANCOMYCIN HCL 500 MG IV SOLR
500.0000 mg | Freq: Two times a day (BID) | INTRAVENOUS | Status: DC
  Filled 2015-02-24: qty 500

## 2015-02-24 NOTE — ED Notes (Signed)
Pt oxygen saturation maintaining within the high 80's, despite receiving 5 L N/C. RN placed pt on non-rebreather. Pt is wheezing. MD notified and breathing treatment ordered.

## 2015-02-24 NOTE — ED Provider Notes (Addendum)
CSN: [id number]     Arrival date & time 02/24/15  0654 History   First MD Initiated Contact with Patient 02/24/15 [phone]     Chief Complaint  Patient presents with  . Nausea      HPI  Patient presents for evaluation via EMS.  Resides at an independent living center. Primary care physician is Carol Ada at [facility].  Patient complains of being nauseated this morning. States she's been coughing for the last several days. Feels generalized weakness. Nauseated but not vomiting. Presents here.  She is not certain if she has had fever at home. No chest pain or abdominal pain.  Patient does mention after completion of my initial assessment that occasionally she will wake up and feel like there are parasites under her skin. She is convinced that they have come from her couch. States her physician is here for Valium to take so that she "isn't aware of them".   She states this with an otherwise very clear sensorium.   Past Medical History  Diagnosis Date  . Hypothyroidism   . Esophageal reflux   . Seasonal allergies   . Hypercholesteremia   . Depression   . Anxiety   . History of diverticulitis of colon   . History of peptic ulcer   . History of non-Hodgkin's lymphoma   . History of breast cancer     Left Breast  . Chronic back pain   . Cancer [facility])    Past Surgical History  Procedure Laterality Date  . Abdominal hysterectomy    . Left mastectomy    . Laminectomy  2012   Family History  Problem Relation Age of Onset  . Diabetes Mother   . Heart disease Mother   . Heart attack Mother   . Cancer - Prostate Father    Social History  Substance Use Topics  . Smoking status: Former Smoker    Types: Cigarettes  . Smokeless tobacco: None  . Alcohol Use: No   OB History    No data available     Review of Systems  Constitutional: Positive for chills. Negative for fever, diaphoresis, appetite change and fatigue.  HENT: Negative for mouth sores, sore throat and  trouble swallowing.   Eyes: Negative for visual disturbance.  Respiratory: Positive for cough. Negative for chest tightness, shortness of breath and wheezing.   Cardiovascular: Negative for chest pain.  Gastrointestinal: Positive for nausea. Negative for vomiting, abdominal pain, diarrhea and abdominal distention.  Endocrine: Negative for polydipsia, polyphagia and polyuria.  Genitourinary: Negative for dysuria, frequency and hematuria.  Musculoskeletal: Negative for gait problem.  Skin: Negative for color change, pallor and rash.  Neurological: Negative for dizziness, syncope, light-headedness and headaches.  Hematological: Does not bruise/bleed easily.  Psychiatric/Behavioral: Negative for behavioral problems and confusion.      Allergies  Amoxicillin; Celexa; Iohexol; Pollen extract; Prozac; Promethazine; and Shrimp  Home Medications   Prior to Admission medications   Medication Sig Start Date End Date Taking? Authorizing Provider  buPROPion (WELLBUTRIN XL) 150 MG 24 hr tablet Take 300 mg by mouth daily.    Yes Historical Provider, MD  calcium citrate (CALCITRATE - DOSED IN MG ELEMENTAL CALCIUM) 950 MG tablet Take 200 mg of elemental calcium by mouth daily.   Yes Historical Provider, MD  diazepam (VALIUM) 5 MG tablet Take 5 mg by mouth daily as needed for anxiety or muscle spasms (and nausea).   Yes Historical Provider, MD  iron polysaccharides (NIFEREX) 150 MG capsule Take  150 mg by mouth daily.   Yes Historical Provider, MD  levothyroxine (SYNTHROID) 88 MCG tablet Take 88 mcg by mouth daily before breakfast.   Yes Historical Provider, MD  polyethylene glycol powder (MIRALAX) powder Take 1 Container by mouth daily as needed for mild constipation.    Yes Historical Provider, MD  simvastatin (ZOCOR) 20 MG tablet Take 20 mg by mouth daily.   Yes Historical Provider, MD  hydrOXYzine (ATARAX/VISTARIL) 10 MG tablet Take 1 tablet (10 mg total) by mouth every 6 (six) hours as needed for  itching. Patient not taking: Reported on 02/24/2015 11/03/14   Waynetta Pean, PA-C  Multiple Vitamin (MULTIVITAMIN WITH MINERALS) TABS tablet Take 1 tablet by mouth daily.    Historical Provider, MD  omeprazole (PRILOSEC) 40 MG capsule Take 40 mg by mouth daily.    Historical Provider, MD   BP 148/80 mmHg  Pulse 105  Temp(Src) 98.4 F (36.9 C) (Oral)  Resp 37  Ht 5' (1.524 m)  Wt 155 lb (70.308 kg)  BMI 30.27 kg/m2  SpO2 91% Physical Exam  Constitutional: She is oriented to person, place, and time. She appears well-developed and well-nourished. No distress.  HENT:  Head: Normocephalic.  Eyes: Conjunctivae are normal. Pupils are equal, round, and reactive to light. No scleral icterus.  Neck: Normal range of motion. Neck supple. No thyromegaly present.  Cardiovascular: Normal rate and regular rhythm.  Exam reveals no gallop and no friction rub.   No murmur heard. Pulmonary/Chest: Effort normal and breath sounds normal. No respiratory distress. She has no decreased breath sounds. She has no wheezes. She has no rales.  Tachypneic  Abdominal: Soft. Bowel sounds are normal. She exhibits no distension. There is no tenderness. There is no rebound.  Musculoskeletal: Normal range of motion.  Neurological: She is alert and oriented to person, place, and time.  Awake, alert.  Skin: Skin is warm and dry. No rash noted.  No abnormalities.  No sign of bites, excoriations, infestations.  Psychiatric: She has a normal mood and affect. Her behavior is normal.    ED Course  Procedures (including critical care time) Labs Review Labs Reviewed  CBC WITH DIFFERENTIAL/PLATELET - Abnormal; Notable for the following:    WBC 13.3 (*)    Neutro Abs 10.8 (*)    All other components within normal limits  BASIC METABOLIC PANEL - Abnormal; Notable for the following:    Glucose, Bld 122 (*)    All other components within normal limits  URINE CULTURE  CULTURE, BLOOD (ROUTINE X 2)  CULTURE, BLOOD (ROUTINE  X 2)  URINALYSIS, ROUTINE W REFLEX MICROSCOPIC (NOT AT [facility])  I-STAT CG4 LACTIC ACID, ED    Imaging Review Dg Chest 1 View  02/24/2015  CLINICAL DATA:  Multiple recent falls with chest pain, shortness of Breath EXAM: CHEST  1 VIEW COMPARISON:  03/14/2014 FINDINGS: The cardiac shadow is stable. A large hiatal hernia is again identified. Diffuse patchy changes are identified throughout both lungs. Although these may be chronic in nature there are new from the prior exam and some acute component must be considered. No sizable effusion is noted. Aortic calcifications are seen. No bony abnormality is noted. IMPRESSION: Patchy bilateral densities likely representing chronic fibrosis although an acute component cannot be totally excluded. Short-term followup following appropriate therapy is recommended. Electronically Signed   By: Inez Catalina M.D.   On: 02/24/2015 08:19   Dg Hip Unilat With Pelvis 2-3 Views Right  02/24/2015  CLINICAL DATA:  Multiple falls  over the past few days with right leg pain, initial encounter EXAM: DG HIP (WITH OR WITHOUT PELVIS) 2-3V RIGHT COMPARISON:  None. FINDINGS: The pelvic ring is intact. Degenerative changes of the hip joints are noted bilaterally. No definitive fracture or dislocation is noted. Postsurgical changes and degenerative change in the lower lumbar spine is noted. Degenerative change of pubic symphysis is seen as well. IMPRESSION: No acute abnormality noted. Electronically Signed   By: Inez Catalina M.D.   On: 02/24/2015 08:14   I have personally reviewed and evaluated these images and lab results as part of my medical decision-making.   EKG Interpretation None      MDM   Final diagnoses:  CAP (community acquired pneumonia)   Patient has a very calmly stated 60 delusion about her skin. However otherwise she does not appear psychotic is otherwise calm and accurate with her history. Upon initial evaluation was a 67 on room air when discussing the parasites.  Has not repeated anything about the "parasites" has been placed on O2. X-ray shows new abnormalities versus most recent which appear to be infiltrates. Has a leukocytosis and hypoxemia CONSISTENT with pneumonia. Given IV antibiotics after blood cultures. I placed a call to [facility] hospitalist regarding admission. Patient's primary care is through [facility].    Tanna Furry, MD 02/24/15 1012  Tanna Furry, MD 02/24/15 [phone]

## 2015-02-24 NOTE — Progress Notes (Signed)
ANTIBIOTIC CONSULT NOTE - INITIAL  Pharmacy Consult for vancomycin  Indication: pneumonia  Allergies  Allergen Reactions  . Amoxicillin Other (See Comments)    Excessive sweating   . Celexa [Citalopram Hydrobromide]     unknown  . Iohexol      Desc: will need premedicated   . Pollen Extract   . Prozac [Fluoxetine Hcl]     unknown  . Promethazine Other (See Comments)    Restlessness    . Shrimp [Shellfish Allergy] Itching    Patient Measurements: Height: 5' (152.4 cm) Weight: 155 lb (70.308 kg) IBW/kg (Calculated) : 45.5 Adjusted Body Weight:   Vital Signs: Temp: 98.4 F (36.9 C) (12/06 1001) Temp Source: Oral (12/06 1001) BP: 148/80 mmHg (12/06 1001) Pulse Rate: 105 (12/06 1001) Intake/Output from previous day:   Intake/Output from this shift: Total I/O In: -  Out: 110 [Urine:110]  Labs:  Recent Labs  02/24/15 0738  WBC 13.3*  HGB 13.2  PLT 258  CREATININE 0.80   Estimated Creatinine Clearance: 45 mL/min (by C-G formula based on Cr of 0.8). No results for input(s): VANCOTROUGH, VANCOPEAK, VANCORANDOM, GENTTROUGH, GENTPEAK, GENTRANDOM, TOBRATROUGH, TOBRAPEAK, TOBRARND, AMIKACINPEAK, AMIKACINTROU, AMIKACIN in the last 72 hours.   Microbiology: No results found for this or any previous visit (from the past 720 hour(s)).  Medical History: Past Medical History  Diagnosis Date  . Hypothyroidism   . Esophageal reflux   . Seasonal allergies   . Hypercholesteremia   . Depression   . Anxiety   . History of diverticulitis of colon   . History of peptic ulcer   . History of non-Hodgkin's lymphoma   . History of breast cancer     Left Breast  . Chronic back pain   . Cancer [facility])     Medications:   (Not in a hospital admission)   Assessment: Pt admitted with nausea, coughing and generalized weakness. Afebrile, mild leukocytosis with left shift, VSS, renal fx normal. LA wnl. Initiating abx for possible pneumonia.   Goal of Therapy:  Vancomycin  trough level 15-20 mcg/ml  Plan:  -Vancomycin 1500 mg IV x1 then 500 mg IV q12h -Monitor renal function, cultures, duration of therapy -F/u continuation of GN coverage    Hughes Better, PharmD, BCPS Clinical Pharmacist Pager: [phone] 02/24/2015 10:11 AM

## 2015-02-24 NOTE — ED Notes (Signed)
Pt refusing another nebulizer treatment.

## 2015-02-24 NOTE — ED Notes (Signed)
Per ems, the patient reports being nauseous during movement. No vomiting or diarrhea. Sob when speaking. Patient comes from [facility] at [address] [place], in assisted living. VS 140/70, p 104. No meds or telemetry in route.

## 2015-02-24 NOTE — H&P (Signed)
[facility] History and Physical  Dana [name] [id number] DOB: [birth year] DOA: 02/24/2015  Referring physician: Jeneen Rinks PCP: Reginia Naas, MD   Chief Complaint: cough/nausea  HPI: Dana [name] is a  81 y.o. female with a past medical history that includes hypothyroidism, GERD, anxiety, breast cancer, chronic back pain undergoes frequent injections presents to the emergency department from independent living with the chief complaint of persistent cough. Initial evaluation reveals acute respiratory failure likely related to CAP.  Information obtained from the patient who states that she has had a persistent cough over the last several days. She reports it sounds wet but is nonproductive. Associated symptoms include nausea without vomiting subjective fever and chills decreased oral intake. She denies chest pain palpitations headache dizziness syncope or near-syncope. She denies any abdominal pain dysuria hematuria frequency or urgency. She does report chronic low back "sciatica". Resulting in pain down her left leg. He states she gets injections for this pain the most recent being in September.  Workup in the emergency department oxygen saturation level 87% on room air respiratory rate 37 heart rate 102 she's afebrile with a stable blood pressure. EKG with sinus tachycardia, chest x-ray with new abnormalities concerning for infiltrates, WBC elevated. She is provided with 500 mL bolus of normal saline Fortaz and vancomycin intravenously. After my exam I provided albuterol nebs  Review of Systems:  10 point review of systems complete and all systems are negative except as indicated in the history of present illness  Past Medical History  Diagnosis Date  . Hypothyroidism   . Esophageal reflux   . Seasonal allergies   . Hypercholesteremia   . Depression   . Anxiety   . History of diverticulitis of colon   . History of peptic ulcer   . History of non-Hodgkin's lymphoma   .  History of breast cancer     Left Breast  . Chronic back pain   . Cancer [facility])    Past Surgical History  Procedure Laterality Date  . Abdominal hysterectomy    . Left mastectomy    . Laminectomy  2012   Social History:  reports that she has quit smoking. Her smoking use included Cigarettes. She does not have any smokeless tobacco history on file. She reports that she does not drink alcohol or use illicit drugs. She is a resident of [facility] living she uses a cane and has frequent falls fairly independent with ADLs Allergies  Allergen Reactions  . Amoxicillin Other (See Comments)    Excessive sweating   . Celexa [Citalopram Hydrobromide]     unknown  . Iohexol      Desc: will need premedicated   . Pollen Extract   . Prozac [Fluoxetine Hcl]     unknown  . Promethazine Other (See Comments)    Restlessness    . Shrimp [Shellfish Allergy] Itching    Family History  Problem Relation Age of Onset  . Diabetes Mother   . Heart disease Mother   . Heart attack Mother   . Cancer - Prostate Father      Prior to Admission medications   Medication Sig Start Date End Date Taking? Authorizing Provider  buPROPion (WELLBUTRIN XL) 150 MG 24 hr tablet Take 300 mg by mouth daily.    Yes Historical Provider, MD  calcium citrate (CALCITRATE - DOSED IN MG ELEMENTAL CALCIUM) 950 MG tablet Take 200 mg of elemental calcium by mouth daily.   Yes Historical Provider, MD  diazepam (VALIUM) 5  MG tablet Take 5 mg by mouth daily as needed for anxiety or muscle spasms (and nausea).   Yes Historical Provider, MD  iron polysaccharides (NIFEREX) 150 MG capsule Take 150 mg by mouth daily.   Yes Historical Provider, MD  levothyroxine (SYNTHROID) 88 MCG tablet Take 88 mcg by mouth daily before breakfast.   Yes Historical Provider, MD  polyethylene glycol powder (MIRALAX) powder Take 1 Container by mouth daily as needed for mild constipation.    Yes Historical Provider, MD  simvastatin (ZOCOR)  20 MG tablet Take 20 mg by mouth daily.   Yes Historical Provider, MD  hydrOXYzine (ATARAX/VISTARIL) 10 MG tablet Take 1 tablet (10 mg total) by mouth every 6 (six) hours as needed for itching. Patient not taking: Reported on 02/24/2015 11/03/14   Waynetta Pean, PA-C  Multiple Vitamin (MULTIVITAMIN WITH MINERALS) TABS tablet Take 1 tablet by mouth daily.    Historical Provider, MD  omeprazole (PRILOSEC) 40 MG capsule Take 40 mg by mouth daily.    Historical Provider, MD   Physical Exam: Filed Vitals:   02/24/15 0830 02/24/15 0845 02/24/15 0930 02/24/15 1001  BP: 134/66 139/74 139/71 148/80  Pulse: 102  99 105  Temp:    98.4 F (36.9 C)  TempSrc:    Oral  Resp: 37  37 37  Height:      Weight:      SpO2: 91%  90% 91%    Wt Readings from Last 3 Encounters:  02/24/15 70.308 kg (155 lb)    General:  Appears slightly anxious and whiny but comfortable Eyes: PERRL, normal lids, irises & conjunctiva ENT: grossly normal hearing, lips & tongue mucous membranes of her mouth are pink but dry Neck: no LAD, masses or thyromegaly Cardiovascular: RRR, no m/r/g. No LE edema. Telemetry: SR, no arrhythmias  Respiratory: Mild increased work of breathing with repositioning in bed. Breath sounds with fine crackles faint wheezes somewhat diminished airflow Abdomen: soft, ntnd positive bowel sounds throughout Skin: no rash or induration seen on limited exam Musculoskeletal: grossly normal tone BUE/BLE bilateral LE strength 5/5  Psychiatric: grossly normal mood and affect, speech fluent and appropriate Neurologic: grossly non-focal. Speech clear facial symmetry           Labs on Admission:  Basic Metabolic Panel:  Recent Labs Lab 02/24/15 0738  NA 139  K 4.4  CL 105  CO2 26  GLUCOSE 122*  BUN 14  CREATININE 0.80  CALCIUM 9.3   Liver Function Tests: No results for input(s): AST, ALT, ALKPHOS, BILITOT, PROT, ALBUMIN in the last 168 hours. No results for input(s): LIPASE, AMYLASE in the  last 168 hours. No results for input(s): AMMONIA in the last 168 hours. CBC:  Recent Labs Lab 02/24/15 0738  WBC 13.3*  NEUTROABS 10.8*  HGB 13.2  HCT 39.3  MCV 99.2  PLT 258   Cardiac Enzymes: No results for input(s): CKTOTAL, CKMB, CKMBINDEX, TROPONINI in the last 168 hours.  BNP (last 3 results) No results for input(s): BNP in the last 8760 hours.  ProBNP (last 3 results) No results for input(s): PROBNP in the last 8760 hours.   CREATININE: 0.8 (02/24/15 0738) Estimated creatinine clearance - 45 mL/min  CBG: No results for input(s): GLUCAP in the last 168 hours.  Radiological Exams on Admission: Dg Chest 1 View  02/24/2015  CLINICAL DATA:  Multiple recent falls with chest pain, shortness of Breath EXAM: CHEST  1 VIEW COMPARISON:  03/14/2014 FINDINGS: The cardiac shadow is stable. A large  hiatal hernia is again identified. Diffuse patchy changes are identified throughout both lungs. Although these may be chronic in nature there are new from the prior exam and some acute component must be considered. No sizable effusion is noted. Aortic calcifications are seen. No bony abnormality is noted. IMPRESSION: Patchy bilateral densities likely representing chronic fibrosis although an acute component cannot be totally excluded. Short-term followup following appropriate therapy is recommended. Electronically Signed   By: Inez Catalina M.D.   On: 02/24/2015 08:19   Dg Hip Unilat With Pelvis 2-3 Views Right  02/24/2015  CLINICAL DATA:  Multiple falls over the past few days with right leg pain, initial encounter EXAM: DG HIP (WITH OR WITHOUT PELVIS) 2-3V RIGHT COMPARISON:  None. FINDINGS: The pelvic ring is intact. Degenerative changes of the hip joints are noted bilaterally. No definitive fracture or dislocation is noted. Postsurgical changes and degenerative change in the lower lumbar spine is noted. Degenerative change of pubic symphysis is seen as well. IMPRESSION: No acute abnormality  noted. Electronically Signed   By: Inez Catalina M.D.   On: 02/24/2015 08:14    EKG: Independently reviewed sinus tachycardia as noted above  Assessment/Plan Principal Problem:   Acute respiratory failure with hypoxia (HCC) Active Problems:   CAP (community acquired pneumonia)   Tachycardia   Hypothyroidism   Chronic back pain   Anxiety   Falls frequently   #1. Acute respiratory failure with hypoxia likely related to community-acquired pneumonia per chest x-ray. -Admit to MedSurg. -Continue oxygen supplementation keeping oxygen saturation level above 90% -Albuterol nebulizers every 6 hours -Antibiotics per protocol -Received Solu-Medrol in the emergency department   #2. Community acquired pneumonia. Patient is a resident of [facility]. She was given vancomycin and Fortaz in the ED -Antibiotics per protocol given allergies -Blood cultures 2 -Strep pneumo and Legionella urine antigens -Influenza panel -Supportive therapy as noted above  #3. Tachycardia. Related to above in setting of mild dehydration secondary to decreased oral intake - She was provided with 500 mils of normal saline in the ED -Will gently hydrate with continuous infusion -Monitor vital signs every 4 hours 3  #4. Hypothyroidism. -Obtain a TSH -Continue home meds  #5. Chronic back pain/sciatica. History of frequent injections most recent in September -appears stable at baseline but patient with great difficulty repositioning self in bed -Not on any analgesia at home. -Provide low-dose analgesia  #6. Anxiety/depression. Chart review indicates a long history of same. He demonstrates a pattern of complaining about parasites under her skin and continuous itching and paranoia that in 6 coming from her couch -The time of my exam she made no mention of this but did complain to the ED doc -We'll continue her home Vistaril when necessary -continue home valium at lower dose  #7. Frequent  falls. Hx of same. Likely related to chronic sciatica with left leg pain -most recent injection September -exam benign -PT evaluation -supportive therapy    Code Status: DNR DVT Prophylaxis: Family Communication: none Disposition Plan: back to facility  Time spent: 24 minutes  [facility]

## 2015-02-25 DIAGNOSIS — E039 Hypothyroidism, unspecified: Secondary | ICD-10-CM

## 2015-02-25 LAB — CBC
HCT: 34.5 % — ABNORMAL LOW (ref 36.0–46.0)
Hemoglobin: 11.1 g/dL — ABNORMAL LOW (ref 12.0–15.0)
MCH: 32.2 pg (ref 26.0–34.0)
MCHC: 32.2 g/dL (ref 30.0–36.0)
MCV: 100 fL (ref 78.0–100.0)
PLATELETS: 238 10*3/uL (ref 150–400)
RBC: 3.45 MIL/uL — ABNORMAL LOW (ref 3.87–5.11)
RDW: 13.9 % (ref 11.5–15.5)
WBC: 12 10*3/uL — ABNORMAL HIGH (ref 4.0–10.5)

## 2015-02-25 LAB — URINE CULTURE: CULTURE: NO GROWTH

## 2015-02-25 LAB — STREP PNEUMONIAE URINARY ANTIGEN: STREP PNEUMO URINARY ANTIGEN: NEGATIVE

## 2015-02-25 MED ORDER — LEVOFLOXACIN IN D5W 750 MG/150ML IV SOLN
750.0000 mg | INTRAVENOUS | Status: DC
Start: 2015-02-26 — End: 2015-02-26

## 2015-02-25 MED ORDER — CETYLPYRIDINIUM CHLORIDE 0.05 % MT LIQD
7.0000 mL | Freq: Two times a day (BID) | OROMUCOSAL | Status: DC
  Administered 2015-02-25 – 2015-03-04 (×11): 7 mL via OROMUCOSAL

## 2015-02-25 MED ORDER — ACETAMINOPHEN 325 MG PO TABS
650.0000 mg | ORAL_TABLET | Freq: Four times a day (QID) | ORAL | Status: DC | PRN
  Administered 2015-02-25 – 2015-03-01 (×4): 650 mg via ORAL
  Filled 2015-02-25 (×4): qty 2

## 2015-02-25 MED ORDER — ENOXAPARIN SODIUM 40 MG/0.4ML ~~LOC~~ SOLN
40.0000 mg | SUBCUTANEOUS | Status: DC
  Administered 2015-02-25 – 2015-03-03 (×7): 40 mg via SUBCUTANEOUS
  Filled 2015-02-25 (×7): qty 0.4

## 2015-02-25 NOTE — Progress Notes (Signed)
PT Cancellation Note  Patient Details Name: Dana [name] MRN: [id number] DOB: 04-05-1927   Cancelled Treatment:     Pt refused PT eval today due to being tired and unwilling to get OOB. Attempted to motivate her and discussed the importance of mobility, but pt still refused. Pt reported she lives at [facility].  Will attempt evaluation again tomorrow.   Lelon Mast 02/25/2015, 10:25 AM

## 2015-02-25 NOTE — Progress Notes (Signed)
[facility] PROGRESS NOTE  Dana [name] [id number] DOB: 1927-12-14 DOA: 02/24/2015 PCP: Reginia Naas, MD  Assessment/Plan: 1. Suspected community acquire pneumonia. -Patient presenting with signs symptoms consistent with pneumonia. Chest x-ray performed in the emergency room revealed patchy bilateral densities. -Plan to continue empiric IV antibiotic therapy with Levaquin 750 mg every 24 hours. -Blood cultures drawn on 02/24/2015 showing no growth to date.  2.  Acute hypoxemic respiratory failure -Evidence by respiratory rate of 44 with an O2 sat of 87% -Likely secondary to community-acquired pneumonia -Patient required 3 L of supplemental oxygen via nasal cannula overnight -Continue supportive care, follow-up on a.m. chest x-ray.  3.  Hypothyroidism. -Continue Synthroid 80 g by mouth daily -TSH within normal limits at 0.674  4.  History of recurrent falls. -Suspect history of sciatica contributing -Physical therapy consult  Code Status: Full Code Family Communication: Family not present Disposition Plan: Pt being treated for CAP, will require IV Ab therapy   Antibiotics:  IV levauqin  HPI/Subjective: Dana [name] is a pleasant 81 year old with a history of hypothyroidism, gastric soft reflux disease, generalized anxiety, history of breast cancer admitted to the medicine service on 02/24/2015 which she presented with complaints of cough associate with shortness of breath. She is currently a resident at independent living facility. Initial workup included chest x-ray that showed patchy bilateral densities. She was started on antibiotic therapy with levofloxacin 750 mg daily for possible PNA.  Objective: Filed Vitals:   02/25/15 0601 02/25/15 1409  BP: 152/106 133/72  Pulse: 87 107  Temp: 98.5 F (36.9 C)   Resp: 16 18    Intake/Output Summary (Last 24 hours) at 02/25/15 1614 Last data filed at 02/25/15 0900  Gross per 24 hour  Intake 479.17 ml   Output   1420 ml  Net -940.83 ml   Filed Weights   02/24/15 0704  Weight: 70.308 kg (155 lb)    Exam:   General:  Dana [name] is nontoxic appearing in no acute distress, awake and alert  Cardiovascular: Regular rate and rhythm, normal S1S2  Respiratory: She is on supplemental oxygen, having rhonchi to b/l lungs  Abdomen: soft, nontender, nondistended  Musculoskeletal: no edema  Data Reviewed: Basic Metabolic Panel:  Recent Labs Lab 02/24/15 0738  NA 139  K 4.4  CL 105  CO2 26  GLUCOSE 122*  BUN 14  CREATININE 0.80  CALCIUM 9.3   Liver Function Tests: No results for input(s): AST, ALT, ALKPHOS, BILITOT, PROT, ALBUMIN in the last 168 hours. No results for input(s): LIPASE, AMYLASE in the last 168 hours. No results for input(s): AMMONIA in the last 168 hours. CBC:  Recent Labs Lab 02/24/15 0738 02/25/15 0539  WBC 13.3* 12.0*  NEUTROABS 10.8*  --   HGB 13.2 11.1*  HCT 39.3 34.5*  MCV 99.2 100.0  PLT 258 238   Cardiac Enzymes: No results for input(s): CKTOTAL, CKMB, CKMBINDEX, TROPONINI in the last 168 hours. BNP (last 3 results) No results for input(s): BNP in the last 8760 hours.  ProBNP (last 3 results) No results for input(s): PROBNP in the last 8760 hours.  CBG: No results for input(s): GLUCAP in the last 168 hours.  Recent Results (from the past 240 hour(s))  Urine culture     Status: None   Collection Time: 02/24/15  8:47 AM  Result Value Ref Range Status   Specimen Description URINE, CATHETERIZED  Final   Special Requests NONE  Final   Culture NO GROWTH 1 DAY  Final  Report Status 02/25/2015 FINAL  Final  Culture, blood (routine x 2)     Status: None (Preliminary result)   Collection Time: 02/24/15  9:05 AM  Result Value Ref Range Status   Specimen Description BLOOD RIGHT HAND  Final   Special Requests BOTTLES DRAWN AEROBIC ONLY 5CC  Final   Culture NO GROWTH 1 DAY  Final   Report Status PENDING  Incomplete  Culture, blood (routine x  2)     Status: None (Preliminary result)   Collection Time: 02/24/15  9:17 AM  Result Value Ref Range Status   Specimen Description BLOOD BLOOD RIGHT FOREARM  Final   Special Requests BOTTLES DRAWN AEROBIC AND ANAEROBIC 5CC  Final   Culture NO GROWTH 1 DAY  Final   Report Status PENDING  Incomplete     Studies: Dg Chest 1 View  02/24/2015  CLINICAL DATA:  Multiple recent falls with chest pain, shortness of Breath EXAM: CHEST  1 VIEW COMPARISON:  03/14/2014 FINDINGS: The cardiac shadow is stable. A large hiatal hernia is again identified. Diffuse patchy changes are identified throughout both lungs. Although these may be chronic in nature there are new from the prior exam and some acute component must be considered. No sizable effusion is noted. Aortic calcifications are seen. No bony abnormality is noted. IMPRESSION: Patchy bilateral densities likely representing chronic fibrosis although an acute component cannot be totally excluded. Short-term followup following appropriate therapy is recommended. Electronically Signed   By: Inez Catalina M.D.   On: 02/24/2015 08:19   Dg Hip Unilat With Pelvis 2-3 Views Right  02/24/2015  CLINICAL DATA:  Multiple falls over the past few days with right leg pain, initial encounter EXAM: DG HIP (WITH OR WITHOUT PELVIS) 2-3V RIGHT COMPARISON:  None. FINDINGS: The pelvic ring is intact. Degenerative changes of the hip joints are noted bilaterally. No definitive fracture or dislocation is noted. Postsurgical changes and degenerative change in the lower lumbar spine is noted. Degenerative change of pubic symphysis is seen as well. IMPRESSION: No acute abnormality noted. Electronically Signed   By: Inez Catalina M.D.   On: 02/24/2015 08:14    Scheduled Meds: . albuterol  2.5 mg Nebulization TID  . albuterol  10 mg/hr Nebulization Once  . antiseptic oral rinse  7 mL Mouth Rinse BID  . buPROPion  300 mg Oral Daily  . calcium carbonate  0.5 tablet Oral Daily  .  enoxaparin (LOVENOX) injection  40 mg Subcutaneous Q24H  . iron polysaccharides  150 mg Oral Daily  . [START ON 02/26/2015] levofloxacin (LEVAQUIN) IV  750 mg Intravenous Q48H  . levothyroxine  88 mcg Oral QAC breakfast  . pantoprazole  40 mg Oral Daily  . simvastatin  20 mg Oral q1800   Continuous Infusions: . sodium chloride 50 mL/hr at 02/24/15 1713    Principal Problem:   Acute respiratory failure with hypoxia (HCC) Active Problems:   CAP (community acquired pneumonia)   Tachycardia   Hypothyroidism   Chronic back pain   Anxiety   Falls frequently    Time spent: 30 min    Kelvin Cellar  [facility] Pager [phone]. If 7PM-7AM, please contact night-coverage at [email or web address], password [facility] 02/25/2015, 4:14 PM  LOS: 1 day

## 2015-02-26 ENCOUNTER — Inpatient Hospital Stay (HOSPITAL_COMMUNITY): Payer: Medicare Other

## 2015-02-26 LAB — LEGIONELLA ANTIGEN, URINE

## 2015-02-26 LAB — CBC
HCT: 37.8 % (ref 36.0–46.0)
Hemoglobin: 12.5 g/dL (ref 12.0–15.0)
MCH: 33.4 pg (ref 26.0–34.0)
MCHC: 33.1 g/dL (ref 30.0–36.0)
MCV: 101.1 fL — ABNORMAL HIGH (ref 78.0–100.0)
Platelets: 263 10*3/uL (ref 150–400)
RBC: 3.74 MIL/uL — ABNORMAL LOW (ref 3.87–5.11)
RDW: 14.3 % (ref 11.5–15.5)
WBC: 14.3 10*3/uL — ABNORMAL HIGH (ref 4.0–10.5)

## 2015-02-26 LAB — BASIC METABOLIC PANEL
Anion gap: 10 (ref 5–15)
BUN: 16 mg/dL (ref 6–20)
CO2: 28 mmol/L (ref 22–32)
Calcium: 9 mg/dL (ref 8.9–10.3)
Chloride: 102 mmol/L (ref 101–111)
Creatinine, Ser: 0.76 mg/dL (ref 0.44–1.00)
GFR calc Af Amer: >60 mL/min (ref >60)
GFR calc non Af Amer: >60 mL/min (ref >60)
Glucose, Bld: 123 mg/dL — ABNORMAL HIGH (ref 65–99)
Potassium: 4.1 mmol/L (ref 3.5–5.1)
Sodium: 140 mmol/L (ref 135–145)

## 2015-02-26 LAB — MRSA PCR SCREENING
MRSA BY PCR: NEGATIVE
MRSA BY PCR: NEGATIVE

## 2015-02-26 LAB — BRAIN NATRIURETIC PEPTIDE: B Natriuretic Peptide: 1346 pg/mL — ABNORMAL HIGH (ref 0.0–100.0)

## 2015-02-26 MED ORDER — PIPERACILLIN-TAZOBACTAM 3.375 G IVPB
3.3750 g | Freq: Three times a day (TID) | INTRAVENOUS | Status: DC
  Administered 2015-02-26 – 2015-03-02 (×12): 3.375 g via INTRAVENOUS
  Filled 2015-02-26 (×17): qty 50

## 2015-02-26 MED ORDER — LEVALBUTEROL HCL 1.25 MG/0.5ML IN NEBU
INHALATION_SOLUTION | RESPIRATORY_TRACT | Status: AC
  Filled 2015-02-26: qty 0.5

## 2015-02-26 MED ORDER — LEVALBUTEROL HCL 1.25 MG/0.5ML IN NEBU
1.2500 mg | INHALATION_SOLUTION | Freq: Once | RESPIRATORY_TRACT | Status: AC
  Administered 2015-02-26: 1.25 mg via RESPIRATORY_TRACT

## 2015-02-26 MED ORDER — FUROSEMIDE 10 MG/ML IJ SOLN
40.0000 mg | Freq: Once | INTRAMUSCULAR | Status: AC
  Administered 2015-02-26: 40 mg via INTRAVENOUS
  Filled 2015-02-26: qty 4

## 2015-02-26 MED ORDER — LEVOTHYROXINE SODIUM 100 MCG IV SOLR
44.0000 ug | Freq: Every day | INTRAVENOUS | Status: DC
  Administered 2015-02-27: 44 ug via INTRAVENOUS
  Filled 2015-02-26: qty 5

## 2015-02-26 MED ORDER — ACETAMINOPHEN 650 MG RE SUPP
650.0000 mg | RECTAL | Status: DC | PRN
  Administered 2015-02-26: 650 mg via RECTAL
  Filled 2015-02-26 (×2): qty 1

## 2015-02-26 MED ORDER — LEVOTHYROXINE SODIUM 100 MCG IV SOLR
88.0000 ug | Freq: Every day | INTRAVENOUS | Status: DC
  Administered 2015-02-26: 88 ug via INTRAVENOUS
  Filled 2015-02-26: qty 5

## 2015-02-26 MED ORDER — LORAZEPAM 2 MG/ML IJ SOLN
0.5000 mg | Freq: Once | INTRAMUSCULAR | Status: AC
  Administered 2015-02-26: 0.5 mg via INTRAVENOUS

## 2015-02-26 MED ORDER — ALBUTEROL SULFATE (2.5 MG/3ML) 0.083% IN NEBU
2.5000 mg | INHALATION_SOLUTION | RESPIRATORY_TRACT | Status: DC | PRN
  Administered 2015-03-01: 2.5 mg via RESPIRATORY_TRACT
  Filled 2015-02-26: qty 3

## 2015-02-26 MED ORDER — VANCOMYCIN HCL 500 MG IV SOLR
500.0000 mg | Freq: Two times a day (BID) | INTRAVENOUS | Status: DC
  Administered 2015-02-26 – 2015-03-01 (×6): 500 mg via INTRAVENOUS
  Filled 2015-02-26 (×8): qty 500

## 2015-02-26 MED ORDER — HALOPERIDOL LACTATE 5 MG/ML IJ SOLN
1.0000 mg | Freq: Once | INTRAMUSCULAR | Status: AC
  Administered 2015-02-26: 1 mg via INTRAVENOUS
  Filled 2015-02-26: qty 1

## 2015-02-26 MED ORDER — LORAZEPAM 2 MG/ML IJ SOLN
INTRAMUSCULAR | Status: AC
Start: 2015-02-26 — End: 2015-02-26
  Filled 2015-02-26: qty 1

## 2015-02-26 NOTE — Progress Notes (Signed)
NT notified RN of patient temp of 101.8. MD paged for tylenol supp. Order. Will continue to monitor patient. Urban Gibson Lexine Baton

## 2015-02-26 NOTE — Progress Notes (Signed)
Pt transferred to [place] 0200. Pt doing well on Bipap with no agitation, alert and orientated x 4, 100% O2.

## 2015-02-26 NOTE — Progress Notes (Addendum)
Shift event note: Notified by RN regarding pt w/ increased WOB though 02 sats remained WNl. RRT and RR RN were paged and responded to bedside. Pt repositioned and given a neb treatment and appeared to improve and was placed back on 02 at 4L [place]. When RRT and RR RN left room briefly and returned pt was again w/ significant increased WOB and use of accessory muscles. 02 sats then noted in the 60's. At bedside pt was noted to be awake and responsive and oriented x 2. Sats better on NRB at 100%. Pt noted w/ persistent dypsnea. BBS w/ crackles at bil bases R>L and mild exp wheezes. Mild tachycardia w/ increased WOB, remaining VSS and pt remains afebrile. Denies CP or other pain.  Assessment/Plan: 1. Acute hypoxic respiratory failure: In setting of suspected CAP. No kown h/o CHF. Will f/u CXR. Pt will require BiPAP for now. Will tx to SDU and continue to monitor closely.   Dana Columbia, NP-C [facility] Pager [phone]   0600: PCXR reveals findings that could reflect pulmonary edema and/or PNA. Will add BNP to am labs and give Lasix 40 mg IV x 1. Will defer further changes in pt's plan to rounding MD.

## 2015-02-26 NOTE — Progress Notes (Signed)
ANTIBIOTIC CONSULT NOTE - INITIAL  Pharmacy Consult for Vancomycin/Zosyn  Indication: rule out pneumonia  Allergies  Allergen Reactions  . Amoxicillin Other (See Comments)    Excessive sweating   . Celexa [Citalopram Hydrobromide]     unknown  . Iohexol      Desc: will need premedicated   . Pollen Extract   . Prozac [Fluoxetine Hcl]     unknown  . Promethazine Other (See Comments)    Restlessness    . Shrimp [Shellfish Allergy] Itching    Patient Measurements: Height: 5' (152.4 cm) Weight: 155 lb (70.308 kg) IBW/kg (Calculated) : 45.5  Vital Signs: Temp: 98.1 F (36.7 C) (12/08 0444) Temp Source: Axillary (12/08 0444) BP: 141/76 mmHg (12/08 0600) Pulse Rate: 103 (12/08 0600)  Labs:  Recent Labs  02/24/15 0738 02/25/15 0539  WBC 13.3* 12.0*  HGB 13.2 11.1*  PLT 258 238  CREATININE 0.80  --    Estimated Creatinine Clearance: 45 mL/min (by C-G formula based on Cr of 0.8).   Medical History: Past Medical History  Diagnosis Date  . Hypothyroidism   . Esophageal reflux   . Seasonal allergies   . Hypercholesteremia   . Depression   . Anxiety   . History of diverticulitis of colon   . History of peptic ulcer   . Chronic back pain     "gets it worse as it goes down" (02/24/2015)  . Bladder prolapse, female, acquired   . Heart murmur   . History of electroconvulsive therapy 1956    "then had insulin shock; for deep deep depression"  . History of hiatal hernia   . Migraine     "might have one maybe once/month" (02/24/2015)  . Arthritis     "in my back" (02/24/2015)  . Breast cancer, left breast ([place]) 1970  . Non Hodgkin's lymphoma ([place]) 2001  . Basal cell carcinoma of nasal tip    Assessment: Broadening coverage from Levaquin to Vancomycin/Zosyn due to worsening airspace disease on CXR.   Goal of Therapy:  Vancomycin trough level 15-20 mcg/ml  Plan:  -Vancomycin 500 mg IV q12h -Zosyn 3.375G IV q8h to be infused over 4 hours -Trend WBC, temp, renal  function  -Drug levels as indicated   Narda Bonds 02/26/2015,6:28 AM

## 2015-02-26 NOTE — Significant Event (Addendum)
Rapid Response Event Note Called to see pt for acute resp distress Overview: Time Called: 0106 Arrival Time: 0108 Event Type: Respiratory  Initial Focused Assessment: On arrival to room, the pt is poorly positioned in the bed, RR 32, sats 93& NRB, and states she is having a hard time breathing. Repositioned pt in bed to assist with lung expansion.  Pt on NRB sats 96%, decreased BS t/o.  RT gave neb tx & pt states she is feeling better though still with RR 28 & using accessory muscles.  Pt improved for a brief period but quickly began to develop increased WOB and remaining on NRB.  Xopenex tx given& PCXR per MD order.  Placed on Bipap and tx to 2C12 for closer monitoring.  Interventions:  Albuterol neb x1 Xopenex neb x1 Bipap  Event Summary: Lamar Blinks, NP   at      at          [facility], Adarsh Mundorf [place]

## 2015-02-26 NOTE — Progress Notes (Signed)
100 Pt c/o of Shortness of breath in acute respiratory distress on 4L of O2. Notified Respiratory, Rapid response and Schorr NP. Pt was placed on NRB and given Neb tx. Pt started to feel better, RT titrated PT to 4L of O2. STAT chest xray completed. Pt started to become in acute respiratory distress, pt place on NRB and neb tx given. RT and Rapid response at bedside. Pt transferred to [facility] room 45, Daughter Ann notified.

## 2015-02-26 NOTE — Progress Notes (Signed)
Pt is currently not wearing BIPAP/ She is wearing 6L O2. Pt is resting comfortably. No distress noted. WOB normal, No SOB. Vitals: HR: 87, RR: 25, SpO2: 99. RT will monitor.

## 2015-02-26 NOTE — Progress Notes (Signed)
[facility] PROGRESS NOTE  Dana [name] [id number] DOB: January 28, 1928 DOA: 02/24/2015 PCP: Reginia Naas, MD  Assessment/Plan:  1.  Acute hypoxemic respiratory failure -Evidence by respiratory rate of 44 with an O2 sat of 87% -Overnight events noted, patient's respiratory status further deteriorating having respiratory rate of 44 requiring BiPAP and transferred to the stepdown unit. A repeat chest x-ray revealed diffuse bilateral airspace opacification worsened from previous study. This could reflect worsening pneumonia versus development of pulmonary edema. -She was given 40 mg of IV Lasix this morning. -Her antimicrobial therapy was broadened, as she is now on IV vancomycin and Zosyn. Pharmacy consulted for dosing. -Repeat chest x-ray in a.m.  2.  Suspected healthcare associated pneumonia -Patient presenting with signs symptoms consistent with pneumonia. Chest x-ray performed in the emergency room revealed patchy bilateral densities. -Initially she was treated with empiric IV antibiotic therapy with Levaquin 750 mg every 24 hours. -On the evening of 02/25/2015 she had deterioration in respiratory status requiring transfer to the stepdown unit and being placed on BiPAP. Repeat chest x-ray revealing diffuse bilateral airspace opacification which could reflect worsening pneumonia. -Her antibiotic regimen has been broadened this morning and she is now on IV Zosyn and Vancomycin -Blood cultures drawn on 02/24/2015 showing no growth to date. -Continue close monitoring in the step down unit. Repeat chest x-ray in a.m.  3.  Delirium -Patient is confused, disoriented, having an altered mental state. This has likely resulted from multiple factors including hospitalization, acute illness, benzodiazepine therapy, respiratory failure. -Treating medical illness, providing respiratory support, broadening IV antibiotic therapy.   4.  History of recurrent falls. -Suspect history of  sciatica contributing -Physical therapy consult when medically stable  5.  Hypothyroidism -Continue Synthroid 88 g by mouth daily.  Code Status: Full Code Family Communication: I spoke to her daughter Dana [name] over telephone conversation and updated her on patient's condition Disposition Plan: Patient was transferred to the stepdown unit overnight   Antibiotics:  IV levauqin stopped on 02/26/2015  IV vancomycin started on 02/26/2015  IV Zosyn started on 02/26/2015  HPI/Subjective: Dana [name] is a pleasant 80 year old with a history of hypothyroidism, gastric soft reflux disease, generalized anxiety, history of breast cancer admitted to the medicine service on 02/24/2015 which she presented with complaints of cough associate with shortness of breath. She is currently a resident at independent living facility. Initial workup included chest x-ray that showed patchy bilateral densities. She was started on antibiotic therapy with levofloxacin 750 mg daily for possible PNA. On the evening of 02/25/2015 patient's respiratory status further deteriorating, as she was transferred to the stepdown unit and placed on BiPAP. A repeat chest x-ray revealed worsening bilateral airspace opacification which could reflect worsening multifocal pneumonia versus pulmonary edema. Her antibiotics were broadened as she was changed to Zosyn and vancomycin. She was also given 40 mg of IV Lasix.  Objective: Filed Vitals:   02/26/15 0600 02/26/15 0738  BP: 141/76   Pulse: 103 99  Temp:    Resp: 38 28    Intake/Output Summary (Last 24 hours) at 02/26/15 0746 Last data filed at 02/26/15 0005  Gross per 24 hour  Intake    360 ml  Output   1600 ml  Net  -1240 ml   Filed Weights   02/24/15 0704  Weight: 70.308 kg (155 lb)    Exam:   General:  Dana [name] is nontoxic appearing in no acute distress, awake and alert  Cardiovascular: Regular rate and rhythm, normal S1S2  Respiratory:  She is on supplemental  oxygen, having rhonchi to b/l lungs  Abdomen: soft, nontender, nondistended  Musculoskeletal: no edema  Data Reviewed: Basic Metabolic Panel:  Recent Labs Lab 02/24/15 0738  NA 139  K 4.4  CL 105  CO2 26  GLUCOSE 122*  BUN 14  CREATININE 0.80  CALCIUM 9.3   Liver Function Tests: No results for input(s): AST, ALT, ALKPHOS, BILITOT, PROT, ALBUMIN in the last 168 hours. No results for input(s): LIPASE, AMYLASE in the last 168 hours. No results for input(s): AMMONIA in the last 168 hours. CBC:  Recent Labs Lab 02/24/15 0738 02/25/15 0539  WBC 13.3* 12.0*  NEUTROABS 10.8*  --   HGB 13.2 11.1*  HCT 39.3 34.5*  MCV 99.2 100.0  PLT 258 238   Cardiac Enzymes: No results for input(s): CKTOTAL, CKMB, CKMBINDEX, TROPONINI in the last 168 hours. BNP (last 3 results) No results for input(s): BNP in the last 8760 hours.  ProBNP (last 3 results) No results for input(s): PROBNP in the last 8760 hours.  CBG: No results for input(s): GLUCAP in the last 168 hours.  Recent Results (from the past 240 hour(s))  Urine culture     Status: None   Collection Time: 02/24/15  8:47 AM  Result Value Ref Range Status   Specimen Description URINE, CATHETERIZED  Final   Special Requests NONE  Final   Culture NO GROWTH 1 DAY  Final   Report Status 02/25/2015 FINAL  Final  Culture, blood (routine x 2)     Status: None (Preliminary result)   Collection Time: 02/24/15  9:05 AM  Result Value Ref Range Status   Specimen Description BLOOD RIGHT HAND  Final   Special Requests BOTTLES DRAWN AEROBIC ONLY 5CC  Final   Culture NO GROWTH 1 DAY  Final   Report Status PENDING  Incomplete  Culture, blood (routine x 2)     Status: None (Preliminary result)   Collection Time: 02/24/15  9:17 AM  Result Value Ref Range Status   Specimen Description BLOOD BLOOD RIGHT FOREARM  Final   Special Requests BOTTLES DRAWN AEROBIC AND ANAEROBIC 5CC  Final   Culture NO GROWTH 1 DAY  Final   Report Status  PENDING  Incomplete  MRSA PCR Screening     Status: None   Collection Time: 02/25/15  9:34 PM  Result Value Ref Range Status   MRSA by PCR NEGATIVE NEGATIVE Final    Comment:        The GeneXpert MRSA Assay (FDA approved for NASAL specimens only), is one component of a comprehensive MRSA colonization surveillance program. It is not intended to diagnose MRSA infection nor to guide or monitor treatment for MRSA infections.   MRSA PCR Screening     Status: None   Collection Time: 02/26/15  3:01 AM  Result Value Ref Range Status   MRSA by PCR NEGATIVE NEGATIVE Final    Comment:        The GeneXpert MRSA Assay (FDA approved for NASAL specimens only), is one component of a comprehensive MRSA colonization surveillance program. It is not intended to diagnose MRSA infection nor to guide or monitor treatment for MRSA infections.      Studies: Dg Chest 1 View  02/24/2015  CLINICAL DATA:  Multiple recent falls with chest pain, shortness of Breath EXAM: CHEST  1 VIEW COMPARISON:  03/14/2014 FINDINGS: The cardiac shadow is stable. A large hiatal hernia is again identified. Diffuse patchy changes are identified throughout both lungs. Although  these may be chronic in nature there are new from the prior exam and some acute component must be considered. No sizable effusion is noted. Aortic calcifications are seen. No bony abnormality is noted. IMPRESSION: Patchy bilateral densities likely representing chronic fibrosis although an acute component cannot be totally excluded. Short-term followup following appropriate therapy is recommended. Electronically Signed   By: Inez Catalina M.D.   On: 02/24/2015 08:19   Dg Chest Port 1 View  02/26/2015  CLINICAL DATA:  Acute onset of shortness of breath and respiratory distress. Initial encounter. EXAM: PORTABLE CHEST 1 VIEW COMPARISON:  Chest radiograph performed 02/24/2015 FINDINGS: Diffuse bilateral airspace opacification is noted, worsened from the prior  study. This may reflect pulmonary edema, right worse than left, or multifocal pneumonia. Small bilateral pleural effusions are noted. No pneumothorax is seen. The cardiomediastinal silhouette is mildly enlarged. No acute osseous abnormalities are identified. IMPRESSION: Diffuse bilateral airspace opacification is worsened from the prior study. This may reflect pulmonary edema, right worse than left, or multifocal pneumonia. Small bilateral pleural effusions noted. Mild cardiomegaly. Electronically Signed   By: Garald Balding M.D.   On: 02/26/2015 02:07   Dg Hip Unilat With Pelvis 2-3 Views Right  02/24/2015  CLINICAL DATA:  Multiple falls over the past few days with right leg pain, initial encounter EXAM: DG HIP (WITH OR WITHOUT PELVIS) 2-3V RIGHT COMPARISON:  None. FINDINGS: The pelvic ring is intact. Degenerative changes of the hip joints are noted bilaterally. No definitive fracture or dislocation is noted. Postsurgical changes and degenerative change in the lower lumbar spine is noted. Degenerative change of pubic symphysis is seen as well. IMPRESSION: No acute abnormality noted. Electronically Signed   By: Inez Catalina M.D.   On: 02/24/2015 08:14    Scheduled Meds: . albuterol  2.5 mg Nebulization TID  . antiseptic oral rinse  7 mL Mouth Rinse BID  . buPROPion  300 mg Oral Daily  . calcium carbonate  0.5 tablet Oral Daily  . enoxaparin (LOVENOX) injection  40 mg Subcutaneous Q24H  . iron polysaccharides  150 mg Oral Daily  . levothyroxine  88 mcg Oral QAC breakfast  . pantoprazole  40 mg Oral Daily  . piperacillin-tazobactam (ZOSYN)  IV  3.375 g Intravenous 3 times per day  . simvastatin  20 mg Oral q1800  . vancomycin  500 mg Intravenous Q12H   Continuous Infusions:    Principal Problem:   Acute respiratory failure with hypoxia (HCC) Active Problems:   CAP (community acquired pneumonia)   Tachycardia   Hypothyroidism   Chronic back pain   Anxiety   Falls frequently    Time  spent: 40 min    Kelvin Cellar  [facility] Pager [phone]. If 7PM-7AM, please contact night-coverage at [email or web address], password [facility] 02/26/2015, 7:46 AM  LOS: 2 days

## 2015-02-26 NOTE — Progress Notes (Signed)
PT Cancellation Note  Patient Details Name: SHERENA SCHWERTNER MRN: [id number] DOB: 01-09-1928   Cancelled Treatment:    Reason Eval/Treat Not Completed: Patient not medically ready. Pt transferred to stepdown unit overnight. She is now on bipap. PT to follow up tomorrow to assess med status for completion of PT eval.   Lorriane Shire 02/26/2015, 8:59 AM

## 2015-02-27 ENCOUNTER — Inpatient Hospital Stay (HOSPITAL_COMMUNITY): Payer: Medicare Other

## 2015-02-27 ENCOUNTER — Other Ambulatory Visit (HOSPITAL_COMMUNITY): Payer: PRIVATE HEALTH INSURANCE

## 2015-02-27 DIAGNOSIS — J189 Pneumonia, unspecified organism: Secondary | ICD-10-CM | POA: Diagnosis present

## 2015-02-27 LAB — BASIC METABOLIC PANEL
ANION GAP: 10 (ref 5–15)
BUN: 16 mg/dL (ref 6–20)
CO2: 29 mmol/L (ref 22–32)
Calcium: 8.7 mg/dL — ABNORMAL LOW (ref 8.9–10.3)
Chloride: 100 mmol/L — ABNORMAL LOW (ref 101–111)
Creatinine, Ser: 0.71 mg/dL (ref 0.44–1.00)
GFR calc Af Amer: >60 mL/min (ref >60)
GFR calc non Af Amer: >60 mL/min (ref >60)
GLUCOSE: 113 mg/dL — AB (ref 65–99)
POTASSIUM: 3.4 mmol/L — AB (ref 3.5–5.1)
Sodium: 139 mmol/L (ref 135–145)

## 2015-02-27 LAB — CBC
HEMATOCRIT: 36.6 % (ref 36.0–46.0)
HEMOGLOBIN: 11.7 g/dL — AB (ref 12.0–15.0)
MCH: 32.3 pg (ref 26.0–34.0)
MCHC: 32 g/dL (ref 30.0–36.0)
MCV: 101.1 fL — ABNORMAL HIGH (ref 78.0–100.0)
Platelets: 242 10*3/uL (ref 150–400)
RBC: 3.62 MIL/uL — ABNORMAL LOW (ref 3.87–5.11)
RDW: 13.8 % (ref 11.5–15.5)
WBC: 10.9 10*3/uL — ABNORMAL HIGH (ref 4.0–10.5)

## 2015-02-27 MED ORDER — IBUPROFEN 200 MG PO TABS
400.0000 mg | ORAL_TABLET | Freq: Once | ORAL | Status: AC
  Administered 2015-02-27: 400 mg via ORAL
  Filled 2015-02-27: qty 2

## 2015-02-27 MED ORDER — FUROSEMIDE 10 MG/ML IJ SOLN
20.0000 mg | Freq: Once | INTRAMUSCULAR | Status: AC
  Administered 2015-02-27: 20 mg via INTRAVENOUS
  Filled 2015-02-27: qty 2

## 2015-02-27 MED ORDER — LEVOTHYROXINE SODIUM 88 MCG PO TABS
88.0000 ug | ORAL_TABLET | Freq: Every day | ORAL | Status: DC
  Administered 2015-02-28 – 2015-03-04 (×5): 88 ug via ORAL
  Filled 2015-02-27 (×5): qty 1

## 2015-02-27 NOTE — Evaluation (Signed)
Clinical/Bedside Swallow Evaluation Patient Details  Name: Dana [name] MRN: [id number] Date of Birth: 1928-02-01  Today's Date: 02/27/2015 Time: SLP Start Time (ACUTE ONLY): 0841 SLP Stop Time (ACUTE ONLY): 0900 SLP Time Calculation (min) (ACUTE ONLY): 19 min  Past Medical History:  Past Medical History  Diagnosis Date  . Hypothyroidism   . Esophageal reflux   . Seasonal allergies   . Hypercholesteremia   . Depression   . Anxiety   . History of diverticulitis of colon   . History of peptic ulcer   . Chronic back pain     "gets it worse as it goes down" (02/24/2015)  . Bladder prolapse, female, acquired   . Heart murmur   . History of electroconvulsive therapy 1956    "then had insulin shock; for deep deep depression"  . History of hiatal hernia   . Migraine     "might have one maybe once/month" (02/24/2015)  . Arthritis     "in my back" (02/24/2015)  . Breast cancer, left breast ([place]) 1970  . Non Hodgkin's lymphoma ([place]) 2001  . Basal cell carcinoma of nasal tip    Past Surgical History:  Past Surgical History  Procedure Laterality Date  . Lumbar laminectomy  2012  . Mastectomy, radical Left 1970  . Back surgery    . Abdominal hysterectomy      "took out my uterus only"  . Cataract extraction w/ intraocular lens  implant, bilateral    . Breast biopsy Left 1970  . Bone marrow biopsy  2001    "for non-Hodgkin's lymphoma"   HPI:  Partial clearing of bilateral pulmonary infiltrates and or edema   Assessment / Plan / Recommendation Clinical Impression  Pt noted to have wet vocal quality and throat clears at baseline. She reports coughing with foods of varying consistencies (cereal, pineapple). No overt s/s aspiration. Pt reports laying down in bed to eat, "I know I shouldn't but I get so tired and it's bad for my reflux." Recommend MBS to evaluate swallow function; scheduled at 11:30.    Aspiration Risk  Moderate aspiration risk    Diet Recommendation   may have  thin liquids and applesauce before MBS  Medication Administration: Whole meds with puree    Other  Recommendations Oral Care Recommendations: Oral care BID   Follow up Recommendations   (tbd)    Frequency and Duration            Prognosis        Swallow Study   General HPI: Partial clearing of bilateral pulmonary infiltrates and or edema Type of Study: Bedside Swallow Evaluation Previous Swallow Assessment:  (none found) Diet Prior to this Study: NPO Temperature Spikes Noted: No Respiratory Status: Nasal cannula History of Recent Intubation: No Behavior/Cognition: Alert;Cooperative;Pleasant mood;Requires cueing Oral Cavity Assessment: Within Functional Limits Oral Care Completed by SLP: No Oral Cavity - Dentition: Adequate natural dentition Vision: Functional for self-feeding Self-Feeding Abilities: Able to feed self Patient Positioning: Upright in bed Baseline Vocal Quality: Normal Volitional Cough: Strong Volitional Swallow: Able to elicit    Oral/Motor/Sensory Function Overall Oral Motor/Sensory Function: Within functional limits   Ice Chips Ice chips: Not tested   Thin Liquid Thin Liquid: Impaired Presentation: Cup Pharyngeal  Phase Impairments: Wet Vocal Quality (wet prior to liquids)    Nectar Thick Nectar Thick Liquid: Not tested   Honey Thick Honey Thick Liquid: Not tested   Puree Puree: Not tested   Solid Solid: Within functional limits  Houston Siren 02/27/2015,9:16 AM  Orbie Pyo Colvin Caroli.Ed [employer] [phone]

## 2015-02-27 NOTE — Evaluation (Signed)
Physical Therapy Evaluation Patient Details Name: Dana [name] MRN: [id number] DOB: Jun 28, 1927 Today's Date: 02/27/2015   History of Present Illness  Dana [name] is a 81 y.o. female with a Past Medical History of hyperthyroidism, GERD, depression, anxiety, sensory hallucinations, non-Hodgkin's lymphoma and breast cancer in remission since 2011 who presents with acute respiratory failure.   Clinical Impression  Pt admitted with above diagnosis. Pt currently with functional limitations due to the deficits listed below (see PT Problem List). Pt very deconditioned and will benefit from SNF prior to d/c back to to I living facility. Will folllow acutely.   Pt will benefit from skilled PT to increase their independence and safety with mobility to allow discharge to the venue listed below.    Follow Up Recommendations SNF;Supervision/Assistance - 24 hour    Equipment Recommendations  Other (comment) (TBA)    Recommendations for Other Services       Precautions / Restrictions Precautions Precautions: Fall Restrictions Weight Bearing Restrictions: No      Mobility  Bed Mobility Overal bed mobility: Independent             General bed mobility comments: Took incr time for pt to get to EOB but did not need help.   Transfers Overall transfer level: Needs assistance Equipment used: Rolling walker (2 wheeled) Transfers: Sit to/from [facility] Sit to Stand: Min assist Stand pivot transfers: Min assist       General transfer comment: Pt was able to stand and take pivotal steps around to recliner with min assist. Somewhat flexed posture needing cues to move feet and RW.   Ambulation/Gait                Stairs            Wheelchair Mobility    Modified Rankin (Stroke Patients Only)       Balance Overall balance assessment: Needs assistance;History of Falls Sitting-balance support: No upper extremity supported;Feet supported Sitting  balance-Leahy Scale: Fair   Postural control: Posterior lean Standing balance support: Bilateral upper extremity supported;During functional activity Standing balance-Leahy Scale: Poor Standing balance comment: requires use of UEs on RW.  Slight posterior lean                              Pertinent Vitals/Pain Pain Assessment: No/denies pain  94 bpm, 95% on 6LO2, 127/60    Home Living Family/patient expects to be discharged to:: Other (Comment) ([place]) Living Arrangements: Alone   Type of Home: Independent living facility Home Access: Level entry     Home Layout: One level Home Equipment: Walker - 4 wheels      Prior Function Level of Independence: Needs assistance   Gait / Transfers Assistance Needed: used 4 wheeled walker, admits she has fallen  ADL's / Homemaking Assistance Needed: sponge bathes because she doesnt feel safe in the shower.         Hand Dominance        Extremity/Trunk Assessment   Upper Extremity Assessment: Defer to OT evaluation           Lower Extremity Assessment: Generalized weakness      Cervical / Trunk Assessment: Kyphotic  Communication   Communication: No difficulties  Cognition Arousal/Alertness: Awake/alert Behavior During Therapy: WFL for tasks assessed/performed Overall Cognitive Status: History of cognitive impairments - at baseline  General Comments      Exercises General Exercises - Lower Extremity Long Arc Quad: AROM;Both;10 reps;Seated      Assessment/Plan    PT Assessment Patient needs continued PT services  PT Diagnosis Generalized weakness   PT Problem List Decreased activity tolerance;Decreased balance;Decreased mobility;Decreased knowledge of use of DME;Decreased knowledge of precautions;Decreased safety awareness;Cardiopulmonary status limiting activity;Decreased strength  PT Treatment Interventions DME instruction;Gait  training;Functional mobility training;Therapeutic activities;Therapeutic exercise;Balance training;Patient/family education   PT Goals (Current goals can be found in the Care Plan section) Acute Rehab PT Goals Patient Stated Goal: to get better PT Goal Formulation: With patient Time For Goal Achievement: 03/13/15 Potential to Achieve Goals: Good    Frequency Min 3X/week   Barriers to discharge Decreased caregiver support      Co-evaluation               End of Session Equipment Utilized During Treatment: Gait belt;Oxygen Activity Tolerance: Patient limited by fatigue Patient left: in chair;with call bell/phone within reach;with chair alarm set Nurse Communication: Mobility status         Time: [id number] PT Time Calculation (min) (ACUTE ONLY): 23 min   Charges:   PT Evaluation $Initial PT Evaluation Tier I: 1 Procedure PT Treatments $Therapeutic Activity: 8-22 mins   PT G CodesIrwin Brakeman F 03/25/2015, 10:37 AM  [facility] [phone] (pager)

## 2015-02-27 NOTE — Care Management Important Message (Signed)
Important Message  Patient Details  Name: Dana [name] MRN: [id number] Date of Birth: 1927/09/29   Medicare Important Message Given:  Yes    [name] 02/27/2015, 1:32 PM

## 2015-02-27 NOTE — Progress Notes (Signed)
Speech Pathology   MBSS complete. Full report located under chart review in imaging section. Double click on DG swallow function.  Recommendations: Pt exhibits a suspected primarily esophageal dysphagia evidenced by stasis in distal esophagus which did not pass GE junction during MBS. She is aware of her hiatal hernia and her reflux. Pt's oral and pharyngeal phases of swallow were within functional limits during. Bolus formation, mastication and transit was swift and effictive. Swallow initiation timely, adequate laryngeal elevation without penetration or aspiration. Pt may have aspirated reflux as she admits to laying down to eat when she is tired. Recommend regular texture, thin liquids, sit up for all meals/snacks and remain upright for one hour, alternate liquids and solids. No ST follow up needed.    Orbie Pyo [place].Ed [employer] [phone]

## 2015-02-27 NOTE — Progress Notes (Addendum)
[facility] PROGRESS NOTE  Dana [name] [id number] DOB: 1927/08/21 DOA: 02/24/2015 PCP: Reginia Naas, MD  Assessment/Plan:  1.  Acute hypoxemic respiratory failure -Evidence by respiratory rate of 44 with an O2 sat of 87% -Overnight events noted, patient's respiratory status further deteriorating having respiratory rate of 44 requiring BiPAP and transferred to the stepdown unit. A repeat chest x-ray revealed diffuse bilateral airspace opacification worsened from previous study. This could reflect worsening pneumonia versus development of pulmonary edema. -She was given 40 mg of IV Lasix on 02/26/2015 -Her antimicrobial therapy was broadened, as she is now on IV vancomycin and Zosyn. Pharmacy consulted for dosing. -I personally reviewed repeat CXR from 02/27/2015; study showing improvement of bilateral infiltrates. She appears better overall and had been on [place] for the greater part of the night. Continues to have crackles on lung exam, will give Lasix 20 mg IV x 1 today   2.  Suspected healthcare associated pneumonia -Patient presenting with signs symptoms consistent with pneumonia. Chest x-ray performed in the emergency room revealed patchy bilateral densities. -Initially she was treated with empiric IV antibiotic therapy with Levaquin 750 mg every 24 hours. -On the evening of 02/25/2015 she had deterioration in respiratory status requiring transfer to the stepdown unit and being placed on BiPAP. Repeat chest x-ray revealing diffuse bilateral airspace opacification which could reflect worsening pneumonia. -Her antibiotic regimen was broadened on 02/26/2015, now on IV Zosyn and Vancomycin -Blood cultures drawn on 02/24/2015 showing no growth to date. -Continue close monitoring in the step down unit. Repeat chest x-ray on 02/27/2015 showing improvement -Continue close monitoring in SDU  3.  Delirium -Patient is confused, disoriented, having an altered mental state. This has  likely resulted from multiple factors including hospitalization, acute illness, benzodiazepine therapy, respiratory failure. -Treating medical illness, providing respiratory support, broadening IV antibiotic therapy.   4.  History of recurrent falls. -Suspect history of sciatica contributing -Physical therapy consult when medically stable  5.  Hypothyroidism -Continue Synthroid 88 g by mouth daily.  6. Nutrition -Showing clinical improvement, will consult SLP for swallow eval.  -Advance her diet after SLP eval.  Code Status: Full Code Family Communication: I spoke to her daughter Dana [name] over telephone conversation and updated her on patient's condition Disposition Plan: Will continue close monitoring in SDU   Antibiotics:  IV levauqin stopped on 02/26/2015  IV vancomycin started on 02/26/2015  IV Zosyn started on 02/26/2015  HPI/Subjective: Dana [name] is a pleasant 81 year old with a history of hypothyroidism, gastric soft reflux disease, generalized anxiety, history of breast cancer admitted to the medicine service on 02/24/2015 which she presented with complaints of cough associate with shortness of breath. She is currently a resident at independent living facility. Initial workup included chest x-ray that showed patchy bilateral densities. She was started on antibiotic therapy with levofloxacin 750 mg daily for possible PNA. On the evening of 02/25/2015 patient's respiratory status further deteriorating, as she was transferred to the stepdown unit and placed on BiPAP. A repeat chest x-ray revealed worsening bilateral airspace opacification which could reflect worsening multifocal pneumonia versus pulmonary edema. Her antibiotics were broadened as she was changed to Zosyn and vancomycin. She was also given 40 mg of IV Lasix.  Objective: Filed Vitals:   02/27/15 0200 02/27/15 0400  BP: 129/57 134/60  Pulse: 88 87  Temp:  98.4 F (36.9 C)  Resp: 27 26    Intake/Output Summary  (Last 24 hours) at 02/27/15 0725 Last data filed at 02/27/15 0500  Gross  per 24 hour  Intake      0 ml  Output   2300 ml  Net  -2300 ml   Filed Weights   02/24/15 0704  Weight: 70.308 kg (155 lb)    Exam:   General:  Dana [name] is awake and alert, look better today. Not agitated.   Cardiovascular: Regular rate and rhythm, normal S1S2  Respiratory: There is ongoing bibasilar crackles, few rhonchi, however, good air movement  Abdomen: soft, nontender, nondistended  Musculoskeletal: no edema  Data Reviewed: Basic Metabolic Panel:  Recent Labs Lab 02/24/15 0738 02/26/15 0755  NA 139 140  K 4.4 4.1  CL 105 102  CO2 26 28  GLUCOSE 122* 123*  BUN 14 16  CREATININE 0.80 0.76  CALCIUM 9.3 9.0   Liver Function Tests: No results for input(s): AST, ALT, ALKPHOS, BILITOT, PROT, ALBUMIN in the last 168 hours. No results for input(s): LIPASE, AMYLASE in the last 168 hours. No results for input(s): AMMONIA in the last 168 hours. CBC:  Recent Labs Lab 02/24/15 0738 02/25/15 0539 02/26/15 0755  WBC 13.3* 12.0* 14.3*  NEUTROABS 10.8*  --   --   HGB 13.2 11.1* 12.5  HCT 39.3 34.5* 37.8  MCV 99.2 100.0 101.1*  PLT 258 238 263   Cardiac Enzymes: No results for input(s): CKTOTAL, CKMB, CKMBINDEX, TROPONINI in the last 168 hours. BNP (last 3 results)  Recent Labs  02/26/15 0755  BNP 1346.0*    ProBNP (last 3 results) No results for input(s): PROBNP in the last 8760 hours.  CBG: No results for input(s): GLUCAP in the last 168 hours.  Recent Results (from the past 240 hour(s))  Urine culture     Status: None   Collection Time: 02/24/15  8:47 AM  Result Value Ref Range Status   Specimen Description URINE, CATHETERIZED  Final   Special Requests NONE  Final   Culture NO GROWTH 1 DAY  Final   Report Status 02/25/2015 FINAL  Final  Culture, blood (routine x 2)     Status: None (Preliminary result)   Collection Time: 02/24/15  9:05 AM  Result Value Ref Range  Status   Specimen Description BLOOD RIGHT HAND  Final   Special Requests BOTTLES DRAWN AEROBIC ONLY 5CC  Final   Culture NO GROWTH 2 DAYS  Final   Report Status PENDING  Incomplete  Culture, blood (routine x 2)     Status: None (Preliminary result)   Collection Time: 02/24/15  9:17 AM  Result Value Ref Range Status   Specimen Description BLOOD BLOOD RIGHT FOREARM  Final   Special Requests BOTTLES DRAWN AEROBIC AND ANAEROBIC 5CC  Final   Culture NO GROWTH 2 DAYS  Final   Report Status PENDING  Incomplete  MRSA PCR Screening     Status: None   Collection Time: 02/25/15  9:34 PM  Result Value Ref Range Status   MRSA by PCR NEGATIVE NEGATIVE Final    Comment:        The GeneXpert MRSA Assay (FDA approved for NASAL specimens only), is one component of a comprehensive MRSA colonization surveillance program. It is not intended to diagnose MRSA infection nor to guide or monitor treatment for MRSA infections.   MRSA PCR Screening     Status: None   Collection Time: 02/26/15  3:01 AM  Result Value Ref Range Status   MRSA by PCR NEGATIVE NEGATIVE Final    Comment:        The GeneXpert MRSA Assay (  FDA approved for NASAL specimens only), is one component of a comprehensive MRSA colonization surveillance program. It is not intended to diagnose MRSA infection nor to guide or monitor treatment for MRSA infections.      Studies: Dg Chest Port 1 View  02/26/2015  CLINICAL DATA:  Acute onset of shortness of breath and respiratory distress. Initial encounter. EXAM: PORTABLE CHEST 1 VIEW COMPARISON:  Chest radiograph performed 02/24/2015 FINDINGS: Diffuse bilateral airspace opacification is noted, worsened from the prior study. This may reflect pulmonary edema, right worse than left, or multifocal pneumonia. Small bilateral pleural effusions are noted. No pneumothorax is seen. The cardiomediastinal silhouette is mildly enlarged. No acute osseous abnormalities are identified. IMPRESSION:  Diffuse bilateral airspace opacification is worsened from the prior study. This may reflect pulmonary edema, right worse than left, or multifocal pneumonia. Small bilateral pleural effusions noted. Mild cardiomegaly. Electronically Signed   By: Garald Balding M.D.   On: 02/26/2015 02:07    Scheduled Meds: . albuterol  2.5 mg Nebulization TID  . antiseptic oral rinse  7 mL Mouth Rinse BID  . buPROPion  300 mg Oral Daily  . calcium carbonate  0.5 tablet Oral Daily  . enoxaparin (LOVENOX) injection  40 mg Subcutaneous Q24H  . furosemide  20 mg Intravenous Once  . iron polysaccharides  150 mg Oral Daily  . levothyroxine  44 mcg Intravenous Daily  . pantoprazole  40 mg Oral Daily  . piperacillin-tazobactam (ZOSYN)  IV  3.375 g Intravenous 3 times per day  . simvastatin  20 mg Oral q1800  . vancomycin  500 mg Intravenous Q12H   Continuous Infusions:    Principal Problem:   Acute respiratory failure with hypoxia (HCC) Active Problems:   HCAP (healthcare-associated pneumonia)   Tachycardia   Hypothyroidism   Chronic back pain   Anxiety   Falls frequently    Time spent: 35 min    Kelvin Cellar  [facility] Pager [phone]. If 7PM-7AM, please contact night-coverage at [email or web address], password [facility] 02/27/2015, 7:25 AM  LOS: 3 days

## 2015-02-27 NOTE — Progress Notes (Signed)
Pt is currently not wearing BIPAP. She is wearing 6L O2. Pt is resting comfortably. No distress noted. WOB normal, NO SOB. Vitals: HR: 96, RR: 26, SpO2: 96%. RT will monitor.

## 2015-02-27 NOTE — Progress Notes (Signed)
Pt taken off bipap and placed on 5L [place]. RT will continue to monitor.

## 2015-02-28 ENCOUNTER — Inpatient Hospital Stay (HOSPITAL_COMMUNITY): Payer: Medicare Other

## 2015-02-28 DIAGNOSIS — I509 Heart failure, unspecified: Secondary | ICD-10-CM

## 2015-02-28 LAB — CBC
HEMATOCRIT: 32.2 % — AB (ref 36.0–46.0)
HEMOGLOBIN: 10.4 g/dL — AB (ref 12.0–15.0)
MCH: 32.1 pg (ref 26.0–34.0)
MCHC: 32.3 g/dL (ref 30.0–36.0)
MCV: 99.4 fL (ref 78.0–100.0)
Platelets: 245 10*3/uL (ref 150–400)
RBC: 3.24 MIL/uL — ABNORMAL LOW (ref 3.87–5.11)
RDW: 13.6 % (ref 11.5–15.5)
WBC: 7.9 10*3/uL (ref 4.0–10.5)

## 2015-02-28 LAB — BASIC METABOLIC PANEL WITH GFR
Anion gap: 8 (ref 5–15)
BUN: 15 mg/dL (ref 6–20)
CO2: 32 mmol/L (ref 22–32)
Calcium: 8.6 mg/dL — ABNORMAL LOW (ref 8.9–10.3)
Chloride: 97 mmol/L — ABNORMAL LOW (ref 101–111)
Creatinine, Ser: 0.71 mg/dL (ref 0.44–1.00)
GFR calc Af Amer: >60 mL/min
GFR calc non Af Amer: >60 mL/min
Glucose, Bld: 110 mg/dL — ABNORMAL HIGH (ref 65–99)
Potassium: 3 mmol/L — ABNORMAL LOW (ref 3.5–5.1)
Sodium: 137 mmol/L (ref 135–145)

## 2015-02-28 MED ORDER — POTASSIUM CHLORIDE CRYS ER 20 MEQ PO TBCR
40.0000 meq | EXTENDED_RELEASE_TABLET | Freq: Four times a day (QID) | ORAL | Status: AC
  Administered 2015-02-28 (×2): 40 meq via ORAL
  Filled 2015-02-28 (×2): qty 2

## 2015-02-28 MED ORDER — FUROSEMIDE 10 MG/ML IJ SOLN
20.0000 mg | Freq: Once | INTRAMUSCULAR | Status: AC
  Administered 2015-02-28: 20 mg via INTRAVENOUS
  Filled 2015-02-28: qty 2

## 2015-02-28 NOTE — Progress Notes (Addendum)
[facility] PROGRESS NOTE  Dana [name] YEM:[id number] DOB: 1928-01-06 DOA: 02/24/2015 PCP: Reginia Naas, MD  Assessment/Plan:  1.  Acute hypoxemic respiratory failure -Evidence by respiratory rate of 44 with an O2 sat of 87% -Overnight events noted, patient's respiratory status further deteriorating having respiratory rate of 44 requiring BiPAP and transferred to the stepdown unit. A repeat chest x-ray revealed diffuse bilateral airspace opacification worsened from previous study. This could reflect worsening pneumonia versus development of pulmonary edema. -She was given 40 mg of IV Lasix on 02/26/2015 -Her antimicrobial therapy was broadened, as she is now on IV vancomycin and Zosyn. Pharmacy consulted for dosing. -I personally reviewed repeat CXR from 02/27/2015; study showing improvement of bilateral infiltrates.   -Titrating oxygen down to 4 L as she is showing clinical improvement -Continues to have crackles on exam with trace edema to lower extremities, will give lasix 20 mg IV  -Follow repeat CXR  2.  Suspected healthcare associated pneumonia -Patient presenting with signs symptoms consistent with pneumonia. Chest x-ray performed in the emergency room revealed patchy bilateral densities. -Initially she was treated with empiric IV antibiotic therapy with Levaquin 750 mg every 24 hours. -On the evening of 02/25/2015 she had deterioration in respiratory status requiring transfer to the stepdown unit and being placed on BiPAP. Repeat chest x-ray revealing diffuse bilateral airspace opacification which could reflect worsening pneumonia. -Her antibiotic regimen was broadened on 02/26/2015, now on IV Zosyn and Vancomycin -Blood cultures drawn on 02/24/2015 showing no growth to date. -Continue close monitoring in the step down unit. Repeat chest x-ray on 02/27/2015 showing improvement -On 02/28/2015 I think overall she is showing improvement. Off NPPV, weaning O2 to 4L [place]. -Plan  to monitor one more day in step down on broad spectrum AB's and narrow regimen in am if she continues to show improvement.   3.  Delirium -Patient is confused, disoriented, having an altered mental state. This has likely resulted from multiple factors including hospitalization, acute illness, benzodiazepine therapy, respiratory failure. -Treating medical illness, providing respiratory support -By 02/28/2015 delirium is much improved  4.  History of recurrent falls. -Suspect history of sciatica contributing -Physical therapy consult when medically stable  5.  Hypothyroidism -Continue Synthroid 88 g by mouth daily.  6. Nutrition -She was seen by speech on 02/27/2015  7.  Hypokalemia -Likely secondary to Lasix -AM labs showing K of 3.0, will replace with oral potassium.   Code Status: Full Code Family Communication: I spoke to her daughter [name] over telephone conversation and updated her on patient's condition Disposition Plan: Will continue close monitoring in SDU   Antibiotics:  IV levauqin stopped on 02/26/2015  IV vancomycin started on 02/26/2015  IV Zosyn started on 02/26/2015  HPI/Subjective: Dana [name] is a pleasant 81 year old with a history of hypothyroidism, gastric soft reflux disease, generalized anxiety, history of breast cancer admitted to the medicine service on 02/24/2015 which she presented with complaints of cough associate with shortness of breath. She is currently a resident at independent living facility. Initial workup included chest x-ray that showed patchy bilateral densities. She was started on antibiotic therapy with levofloxacin 750 mg daily for possible PNA. On the evening of 02/25/2015 patient's respiratory status further deteriorating, as she was transferred to the stepdown unit and placed on BiPAP. A repeat chest x-ray revealed worsening bilateral airspace opacification which could reflect worsening multifocal pneumonia versus pulmonary edema. Her  antibiotics were broadened as she was changed to Zosyn and vancomycin. She was also given 40 mg  of IV Lasix.  Objective: Filed Vitals:   02/28/15 0600 02/28/15 0800  BP: 113/53 123/62  Pulse: 76 81  Temp:    Resp: 18 20    Intake/Output Summary (Last 24 hours) at 02/28/15 3817 Last data filed at 02/28/15 0800  Gross per 24 hour  Intake    950 ml  Output   1650 ml  Net   -700 ml   Filed Weights   02/24/15 0704  Weight: 70.308 kg (155 lb)    Exam:   General:  Dana [name] is awake and alert, look better today. Not agitated. Now on 4L supplemental O2  Cardiovascular: Regular rate and rhythm, normal S1S2  Respiratory: There is ongoing bibasilar crackles and rhonchi, few rhonchi, however, good air movement  Abdomen: soft, nontender, nondistended  Musculoskeletal: no edema  Data Reviewed: Basic Metabolic Panel:  Recent Labs Lab 02/24/15 0738 02/26/15 0755 02/27/15 0805 02/28/15 0320  NA 139 140 139 137  K 4.4 4.1 3.4* 3.0*  CL 105 102 100* 97*  CO2 _0 32  GLUCOSE 122* 123* 113* 110*  BUN _1 CREATININE 0.80 0.76 0.71 0.71  CALCIUM 9.3 9.0 8.7* 8.6*   Liver Function Tests: No results for input(s): AST, ALT, ALKPHOS, BILITOT, PROT, ALBUMIN in the last 168 hours. No results for input(s): LIPASE, AMYLASE in the last 168 hours. No results for input(s): AMMONIA in the last 168 hours. CBC:  Recent Labs Lab 02/24/15 0738 02/25/15 0539 02/26/15 0755 02/27/15 0805 02/28/15 0320  WBC 13.3* 12.0* 14.3* 10.9* 7.9  NEUTROABS 10.8*  --   --   --   --   HGB 13.2 11.1* 12.5 11.7* 10.4*  HCT 39.3 34.5* 37.8 36.6 32.2*  MCV 99.2 100.0 101.1* 101.1* 99.4  PLT 258 238 263 242 245   Cardiac Enzymes: No results for input(s): CKTOTAL, CKMB, CKMBINDEX, TROPONINI in the last 168 hours. BNP (last 3 results)  Recent Labs  02/26/15 0755  BNP 1346.0*    ProBNP (last 3 results) No results for input(s): PROBNP in the last 8760 hours.  CBG: No results for  input(s): GLUCAP in the last 168 hours.  Recent Results (from the past 240 hour(s))  Urine culture     Status: None   Collection Time: 02/24/15  8:47 AM  Result Value Ref Range Status   Specimen Description URINE, CATHETERIZED  Final   Special Requests NONE  Final   Culture NO GROWTH 1 DAY  Final   Report Status 02/25/2015 FINAL  Final  Culture, blood (routine x 2)     Status: None (Preliminary result)   Collection Time: 02/24/15  9:05 AM  Result Value Ref Range Status   Specimen Description BLOOD RIGHT HAND  Final   Special Requests BOTTLES DRAWN AEROBIC ONLY 5CC  Final   Culture NO GROWTH 3 DAYS  Final   Report Status PENDING  Incomplete  Culture, blood (routine x 2)     Status: None (Preliminary result)   Collection Time: 02/24/15  9:17 AM  Result Value Ref Range Status   Specimen Description BLOOD BLOOD RIGHT FOREARM  Final   Special Requests BOTTLES DRAWN AEROBIC AND ANAEROBIC 5CC  Final   Culture NO GROWTH 3 DAYS  Final   Report Status PENDING  Incomplete  MRSA PCR Screening     Status: None   Collection Time: 02/25/15  9:34 PM  Result Value Ref Range Status   MRSA by PCR NEGATIVE NEGATIVE Final  Comment:        The GeneXpert MRSA Assay (FDA approved for NASAL specimens only), is one component of a comprehensive MRSA colonization surveillance program. It is not intended to diagnose MRSA infection nor to guide or monitor treatment for MRSA infections.   MRSA PCR Screening     Status: None   Collection Time: 02/26/15  3:01 AM  Result Value Ref Range Status   MRSA by PCR NEGATIVE NEGATIVE Final    Comment:        The GeneXpert MRSA Assay (FDA approved for NASAL specimens only), is one component of a comprehensive MRSA colonization surveillance program. It is not intended to diagnose MRSA infection nor to guide or monitor treatment for MRSA infections.      Studies: Dg Chest 1 View  02/27/2015  CLINICAL DATA:  Respiratory failure. EXAM: CHEST 1 VIEW  COMPARISON:  02/26/2015 .  02/24/2015.  11/14/2010.  CT 10/31/2006. FINDINGS: Patient is rotated to the right. Thoracic aorta is tortuous and calcified consistent with atherosclerotic vascular disease. Stable cardiomegaly. Interim partial clearing of bilateral pulmonary infiltrates and or edema. Low lung volumes. No prominent pleural effusion or pneumothorax. IMPRESSION: 1. Partial clearing of bilateral pulmonary infiltrates and or edema. 2. Heart size stable.  Tortuous atherosclerotic aorta . Electronically Signed   By: Marcello Moores  Register   On: 02/27/2015 07:28   Dg Swallowing Func-speech Pathology  02/27/2015  Objective Swallowing Evaluation:   MBS Patient Details Name: Dana [name] MRN: [id number] Date of Birth: March 08, 1928 Today's Date: 02/27/2015 Time: SLP Start Time (ACUTE ONLY): 1110-SLP Stop Time (ACUTE ONLY): 1125 SLP Time Calculation (min) (ACUTE ONLY): 15 min Past Medical History: Past Medical History Diagnosis Date . Hypothyroidism  . Esophageal reflux  . Seasonal allergies  . Hypercholesteremia  . Depression  . Anxiety  . History of diverticulitis of colon  . History of peptic ulcer  . Chronic back pain    "gets it worse as it goes down" (02/24/2015) . Bladder prolapse, female, acquired  . Heart murmur  . History of electroconvulsive therapy 1956   "then had insulin shock; for deep deep depression" . History of hiatal hernia  . Migraine    "might have one maybe once/month" (02/24/2015) . Arthritis    "in my back" (02/24/2015) . Breast cancer, left breast ([place]) 1970 . Non Hodgkin's lymphoma ([place]) 2001 . Basal cell carcinoma of nasal tip  Past Surgical History: Past Surgical History Procedure Laterality Date . Lumbar laminectomy  2012 . Mastectomy, radical Left 1970 . Back surgery   . Abdominal hysterectomy     "took out my uterus only" . Cataract extraction w/ intraocular lens  implant, bilateral   . Breast biopsy Left 1970 . Bone marrow biopsy  2001   "for non-Hodgkin's lymphoma" HPI: 81 y.o. female with a  past medical history that includes hypothyroidism, GERD, anxiety, breast cancer, chronic back pain presents to the emergency department from independent living with the chief complaint of persistent cough. Found to have acute respiratory failure likely related to CAP. CXR partial clearing of bilateral pulmonary infiltrates and or edema. No Data Recorded Assessment / Plan / Recommendation CHL IP CLINICAL IMPRESSIONS 02/27/2015 Therapy Diagnosis Suspected primary esophageal dysphagia Clinical Impression Pt exhibits a suspected primarily esophageal dysphagia evidenced by stasis in distal esophagus which did not pass GE junction during MBS. She is aware of her hiatal hernia and her reflux.  Pt's oral and pharyngeal phases of swallow were within functional limits during. Bolus formation, mastication and transit was  swift and effictive. Swallow initiation timely, adequate laryngeal elevation without penetration or aspiration. Pt may have aspirated reflux as she admits to laying down to eat when she is tired. Recommend regular texture, thin liquids, sit up for all meals/snacks and remain upright for one hour, alternate liquids and solids. No ST follow up needed.   Impact on safety and function Mild aspiration risk   CHL IP TREATMENT RECOMMENDATION 02/27/2015 Treatment Recommendations No treatment recommended at this time   No flowsheet data found. CHL IP DIET RECOMMENDATION 02/27/2015 SLP Diet Recommendations Regular solids;Thin liquid Liquid Administration via Cup;Straw Medication Administration Whole meds with liquid Compensations -- Postural Changes Remain semi-upright after after feeds/meals (Comment);Seated upright at 90 degrees   CHL IP OTHER RECOMMENDATIONS 02/27/2015 Recommended Consults -- Oral Care Recommendations Oral care BID Other Recommendations --   CHL IP FOLLOW UP RECOMMENDATIONS 02/27/2015 Follow up Recommendations None   No flowsheet data found.     CHL IP ORAL PHASE 02/27/2015 Oral Phase WFL Oral - Pudding  Teaspoon -- Oral - Pudding Cup -- Oral - Honey Teaspoon -- Oral - Honey Cup -- Oral - Nectar Teaspoon -- Oral - Nectar Cup -- Oral - Nectar Straw -- Oral - Thin Teaspoon -- Oral - Thin Cup -- Oral - Thin Straw -- Oral - Puree -- Oral - Mech Soft -- Oral - Regular -- Oral - Multi-Consistency -- Oral - Pill -- Oral Phase - Comment --  CHL IP PHARYNGEAL PHASE 02/27/2015 Pharyngeal Phase WFL Pharyngeal- Pudding Teaspoon -- Pharyngeal -- Pharyngeal- Pudding Cup -- Pharyngeal -- Pharyngeal- Honey Teaspoon -- Pharyngeal -- Pharyngeal- Honey Cup -- Pharyngeal -- Pharyngeal- Nectar Teaspoon -- Pharyngeal -- Pharyngeal- Nectar Cup -- Pharyngeal -- Pharyngeal- Nectar Straw -- Pharyngeal -- Pharyngeal- Thin Teaspoon -- Pharyngeal -- Pharyngeal- Thin Cup -- Pharyngeal -- Pharyngeal- Thin Straw -- Pharyngeal -- Pharyngeal- Puree -- Pharyngeal -- Pharyngeal- Mechanical Soft -- Pharyngeal -- Pharyngeal- Regular -- Pharyngeal -- Pharyngeal- Multi-consistency -- Pharyngeal -- Pharyngeal- Pill -- Pharyngeal -- Pharyngeal Comment --  CHL IP CERVICAL ESOPHAGEAL PHASE 02/27/2015 Cervical Esophageal Phase WFL Pudding Teaspoon -- Pudding Cup -- Honey Teaspoon -- Honey Cup -- Nectar Teaspoon -- Nectar Cup -- Nectar Straw -- Thin Teaspoon -- Thin Cup -- Thin Straw -- Puree -- Mechanical Soft -- Regular -- Multi-consistency -- Pill -- Cervical Esophageal Comment -- [place] Siren 02/27/2015, 1:37 PM Orbie Pyo Litaker M.Ed CCC-SLP Pager [phone]               Scheduled Meds: . albuterol  2.5 mg Nebulization TID  . antiseptic oral rinse  7 mL Mouth Rinse BID  . buPROPion  300 mg Oral Daily  . calcium carbonate  0.5 tablet Oral Daily  . enoxaparin (LOVENOX) injection  40 mg Subcutaneous Q24H  . iron polysaccharides  150 mg Oral Daily  . levothyroxine  88 mcg Oral QAC breakfast  . pantoprazole  40 mg Oral Daily  . piperacillin-tazobactam (ZOSYN)  IV  3.375 g Intravenous 3 times per day  . potassium chloride  40 mEq Oral Q6H  .  simvastatin  20 mg Oral q1800  . vancomycin  500 mg Intravenous Q12H   Continuous Infusions:    Principal Problem:   Acute respiratory failure with hypoxia (HCC) Active Problems:   HCAP (healthcare-associated pneumonia)   Tachycardia   Hypothyroidism   Chronic back pain   Anxiety   Falls frequently    Time spent: 25 min    Kelvin Cellar  [facility] Pager [phone].  If 7PM-7AM, please contact night-coverage at [email or web address], password [facility] 02/28/2015, 8:08 AM  LOS: 4 days

## 2015-02-28 NOTE — Progress Notes (Signed)
  Echocardiogram 2D Echocardiogram has been performed.  Dana [name] 02/28/2015, 10:06 AM

## 2015-03-01 ENCOUNTER — Inpatient Hospital Stay (HOSPITAL_COMMUNITY): Payer: Medicare Other

## 2015-03-01 LAB — CBC WITH DIFFERENTIAL/PLATELET
BASOS ABS: 0 10*3/uL (ref 0.0–0.1)
BASOS PCT: 0 %
EOS ABS: 0.6 10*3/uL (ref 0.0–0.7)
Eosinophils Relative: 7 %
HEMATOCRIT: 31.3 % — AB (ref 36.0–46.0)
Hemoglobin: 10.3 g/dL — ABNORMAL LOW (ref 12.0–15.0)
Lymphocytes Relative: 10 %
Lymphs Abs: 0.8 10*3/uL (ref 0.7–4.0)
MCH: 32.8 pg (ref 26.0–34.0)
MCHC: 32.9 g/dL (ref 30.0–36.0)
MCV: 99.7 fL (ref 78.0–100.0)
MONO ABS: 0.7 10*3/uL (ref 0.1–1.0)
MONOS PCT: 8 %
NEUTROS PCT: 75 %
Neutro Abs: 6.5 10*3/uL (ref 1.7–7.7)
PLATELETS: 227 10*3/uL (ref 150–400)
RBC: 3.14 MIL/uL — ABNORMAL LOW (ref 3.87–5.11)
RDW: 13.7 % (ref 11.5–15.5)
WBC: 8.6 10*3/uL (ref 4.0–10.5)

## 2015-03-01 LAB — CULTURE, BLOOD (ROUTINE X 2)
CULTURE: NO GROWTH
CULTURE: NO GROWTH

## 2015-03-01 LAB — BASIC METABOLIC PANEL
ANION GAP: 10 (ref 5–15)
BUN: 18 mg/dL (ref 6–20)
CALCIUM: 8.8 mg/dL — AB (ref 8.9–10.3)
CO2: 29 mmol/L (ref 22–32)
CREATININE: 0.86 mg/dL (ref 0.44–1.00)
Chloride: 101 mmol/L (ref 101–111)
GFR, EST NON AFRICAN AMERICAN: 60 mL/min — AB (ref >60)
Glucose, Bld: 124 mg/dL — ABNORMAL HIGH (ref 65–99)
Potassium: 4.4 mmol/L (ref 3.5–5.1)
Sodium: 140 mmol/L (ref 135–145)

## 2015-03-01 MED ORDER — DM-GUAIFENESIN ER 30-600 MG PO TB12
1.0000 | ORAL_TABLET | Freq: Two times a day (BID) | ORAL | Status: DC
  Administered 2015-03-01 – 2015-03-04 (×7): 1 via ORAL
  Filled 2015-03-01 (×8): qty 1

## 2015-03-01 MED ORDER — ACETAMINOPHEN 500 MG PO TABS
1000.0000 mg | ORAL_TABLET | Freq: Three times a day (TID) | ORAL | Status: DC | PRN
  Administered 2015-03-01 – 2015-03-04 (×3): 1000 mg via ORAL
  Filled 2015-03-01 (×4): qty 2

## 2015-03-01 NOTE — Progress Notes (Signed)
03/01/2015 Report was given to RN on [place] at 1112. Patient vital at 1000 am was 131/65, heart rate 97, saturation 98%  3Liter, and respiratory rate 28 to 29. Reported to RN on [place] patient foley was removed at 0750 and patient did not voided. [facility] RN.

## 2015-03-01 NOTE — Progress Notes (Signed)
03/01/2015 Patient foley was d/c at she had  150cc of yellow urine. [facility] RN.

## 2015-03-01 NOTE — Procedures (Signed)
Pt refuses the use of BiPAP.  

## 2015-03-01 NOTE — Progress Notes (Signed)
Pharmacy Antibiotic Follow-up Note  EURETHA SROUFE is a 81 y.o. year-old female admitted on 02/24/2015.  The patient is currently on day 4 of Vancomycin + Zosyn for HCAP.  Assessment/Plan: 29 YOF who developed respiratory distress on 12/8 and Vancomycin + Zosyn were started for empiric HCAP coverage. Today is antibiotic D#4. Sent text page to Dr. Coralyn Pear - since MRSA PCR is negative, very unlikely that this is MRSA PNA - Vancomycin has now been discontinued. LOT has yet to be addressed - but the recommended duration for HCAP is 7 days per the updated IDSA guidelines released this year. Would recommend a stop date of 12/14.   1. Continue Zosyn 3.375g IV every 8 hours 2. Consider an antibiotic stop date of 12/14 for a total HCAP treatment duration of 7 days 3. Will continue to follow renal function, culture results, LOT, and antibiotic de-escalation plans   Temp (24hrs), Avg:98 F (36.7 C), Min:97.5 F (36.4 C), Max:98.9 F (37.2 C)   Recent Labs Lab 02/25/15 0539 02/26/15 0755 02/27/15 0805 02/28/15 0320 03/01/15 0236  WBC 12.0* 14.3* 10.9* 7.9 8.6    Recent Labs Lab 02/24/15 0738 02/26/15 0755 02/27/15 0805 02/28/15 0320 03/01/15 0236  CREATININE 0.80 0.76 0.71 0.71 0.86   Estimated Creatinine Clearance: 41.8 mL/min (by C-G formula based on Cr of 0.86).    Allergies  Allergen Reactions  . Amoxicillin Other (See Comments)    Excessive sweating   . Celexa [Citalopram Hydrobromide]     unknown  . Iohexol      Desc: will need premedicated   . Pollen Extract   . Prozac [Fluoxetine Hcl]     unknown  . Promethazine Other (See Comments)    Restlessness    . Shrimp [Shellfish Allergy] Itching    Antimicrobials this admission: Vancomycin 12/6 x 1; restart 12/8>> 12/11 Ceftazidime 12/6 x 1 Levaquin 12/6 >> 12/8 Zosyn 12/8 >>  Levels/dose changes this admission: n/a  Microbiology results: 12/6 BCx >> ngtd 12/6 UCx >> NG 12/8 MRSA PCR neg  Thank you for allowing  pharmacy to be a part of this patient's care.  Alycia Rossetti, PharmD, BCPS Clinical Pharmacist Pager: [phone] 03/01/2015 10:57 AM

## 2015-03-01 NOTE — Progress Notes (Signed)
[facility] PROGRESS NOTE  Dana [name] MWN:[id number] DOB: [birth year] DOA: 02/24/2015 PCP: Reginia Naas, MD  Assessment/Plan:  1.  Acute hypoxemic respiratory failure -Evidence by respiratory rate of 44 with an O2 sat of 87% -Overnight events noted, patient's respiratory status further deteriorating having respiratory rate of 44 requiring BiPAP and transferred to the stepdown unit. A repeat chest x-ray revealed diffuse bilateral airspace opacification worsened from previous study. This could reflect worsening pneumonia versus development of pulmonary edema. -She was given 40 mg of IV Lasix on 02/26/2015 -Her antimicrobial therapy was broadened, as she is now on IV vancomycin and Zosyn. Pharmacy consulted for dosing. -I personally reviewed repeat CXR from 03/01/2015 which showed stable infiltrates compared to prior study on 02/27/2015 -She is showing gradual clinical improvement, oxygen weaned down to 3L. She was assisted out of bed to chair.  -Will plan to continue broad spectum antibiotic therapy  2.  Suspected healthcare associated pneumonia -Patient presenting with signs symptoms consistent with pneumonia. Chest x-ray performed in the emergency room revealed patchy bilateral densities. -Initially she was treated with empiric IV antibiotic therapy with Levaquin 750 mg every 24 hours. -On the evening of 02/25/2015 she had deterioration in respiratory status requiring transfer to the stepdown unit and being placed on BiPAP. Repeat chest x-ray revealing diffuse bilateral airspace opacification which could reflect worsening pneumonia. -Her antibiotic regimen was broadened on 02/26/2015, now on IV Zosyn and Vancomycin -Blood cultures drawn on 02/24/2015 showing no growth to date. -Continue close monitoring in the step down unit. Repeat chest x-ray on 02/27/2015 showing improvement -On 02/28/2015 I think overall she is showing improvement. Off NPPV, weaning O2 to 4L [place]. -Plan to  monitor one more day in step down on broad spectrum AB's and narrow regimen in am if she continues to show improvement.  -Repeat CXR performed on 03/01/2015 showing stable infiltrates. Clinically continues to improve, with decrease in oxygen requirement. Plan to continue broad spectrum antimicrobial therapy  3.  Delirium -Patient is confused, disoriented, having an altered mental state. This has likely resulted from multiple factors including hospitalization, acute illness, benzodiazepine therapy, respiratory failure. -Treating medical illness, providing respiratory support -By 02/28/2015 delirium is much improved  4.  History of recurrent falls. -Suspect history of sciatica contributing -Physical therapy consult when medically stable  5.  Hypothyroidism -Continue Synthroid 88 g by mouth daily.  6. Nutrition -She was seen by speech on 02/27/2015  7.  Hypokalemia -Likely secondary to Lasix -AM labs 03/01/2015 showing K of 3.0, will replace with oral potassium.  -Repeat labs on 03/01/2015 showing K of 4.4  Code Status: Full Code Family Communication: I spoke to her daughter Dana [name] over telephone conversation and updated her on patient's condition Disposition Plan: Transfer out of SDU to tele   Antibiotics:  IV levauqin stopped on 02/26/2015  IV vancomycin started on 02/26/2015  IV Zosyn started on 02/26/2015  HPI/Subjective: Dana [name] is a pleasant 81 year old with a history of hypothyroidism, gastric soft reflux disease, generalized anxiety, history of breast cancer admitted to the medicine service on 02/24/2015 which she presented with complaints of cough associate with shortness of breath. She is currently a resident at independent living facility. Initial workup included chest x-ray that showed patchy bilateral densities. She was started on antibiotic therapy with levofloxacin 750 mg daily for possible PNA. On the evening of 02/25/2015 patient's respiratory status further  deteriorating, as she was transferred to the stepdown unit and placed on BiPAP. A repeat chest x-ray revealed worsening bilateral  airspace opacification which could reflect worsening multifocal pneumonia versus pulmonary edema. Her antibiotics were broadened as she was changed to Zosyn and vancomycin. She was also given 40 mg of IV Lasix.  Objective: Filed Vitals:   03/01/15 0400 03/01/15 0600  BP: 94/48 113/52  Pulse: 92 92  Temp: 98.9 F (37.2 C)   Resp: 24 22    Intake/Output Summary (Last 24 hours) at 03/01/15 0754 Last data filed at 03/01/15 3159  Gross per 24 hour  Intake    560 ml  Output   1450 ml  Net   -890 ml   Filed Weights   02/24/15 0704  Weight: 70.308 kg (155 lb)    Exam:   General:  Dana Fister continues to show improvement, was assisted out of chair to bed  Cardiovascular: Regular rate and rhythm, normal S1S2  Respiratory: Improved lung exam, decrease crackles.   Abdomen: soft, nontender, nondistended  Musculoskeletal: no edema  Data Reviewed: Basic Metabolic Panel:  Recent Labs Lab 02/24/15 0738 02/26/15 0755 02/27/15 0805 02/28/15 0320 03/01/15 0236  NA 139 140 139 137 140  K 4.4 4.1 3.4* 3.0* 4.4  CL 105 102 100* 97* 101  CO2 '26 28 29 ' 32 29  GLUCOSE 122* 123* 113* 110* 124*  BUN '14 16 16 15 18  ' CREATININE 0.80 0.76 0.71 0.71 0.86  CALCIUM 9.3 9.0 8.7* 8.6* 8.8*   Liver Function Tests: No results for input(s): AST, ALT, ALKPHOS, BILITOT, PROT, ALBUMIN in the last 168 hours. No results for input(s): LIPASE, AMYLASE in the last 168 hours. No results for input(s): AMMONIA in the last 168 hours. CBC:  Recent Labs Lab 02/24/15 0738 02/25/15 0539 02/26/15 0755 02/27/15 0805 02/28/15 0320 03/01/15 0236  WBC 13.3* 12.0* 14.3* 10.9* 7.9 8.6  NEUTROABS 10.8*  --   --   --   --  6.5  HGB 13.2 11.1* 12.5 11.7* 10.4* 10.3*  HCT 39.3 34.5* 37.8 36.6 32.2* 31.3*  MCV 99.2 100.0 101.1* 101.1* 99.4 99.7  PLT 258 238 263 242 245 227    Cardiac Enzymes: No results for input(s): CKTOTAL, CKMB, CKMBINDEX, TROPONINI in the last 168 hours. BNP (last 3 results)  Recent Labs  02/26/15 0755  BNP 1346.0*    ProBNP (last 3 results) No results for input(s): PROBNP in the last 8760 hours.  CBG: No results for input(s): GLUCAP in the last 168 hours.  Recent Results (from the past 240 hour(s))  Urine culture     Status: None   Collection Time: 02/24/15  8:47 AM  Result Value Ref Range Status   Specimen Description URINE, CATHETERIZED  Final   Special Requests NONE  Final   Culture NO GROWTH 1 DAY  Final   Report Status 02/25/2015 FINAL  Final  Culture, blood (routine x 2)     Status: None (Preliminary result)   Collection Time: 02/24/15  9:05 AM  Result Value Ref Range Status   Specimen Description BLOOD RIGHT HAND  Final   Special Requests BOTTLES DRAWN AEROBIC ONLY 5CC  Final   Culture NO GROWTH 4 DAYS  Final   Report Status PENDING  Incomplete  Culture, blood (routine x 2)     Status: None (Preliminary result)   Collection Time: 02/24/15  9:17 AM  Result Value Ref Range Status   Specimen Description BLOOD BLOOD RIGHT FOREARM  Final   Special Requests BOTTLES DRAWN AEROBIC AND ANAEROBIC 5CC  Final   Culture NO GROWTH 4 DAYS  Final  Report Status PENDING  Incomplete  MRSA PCR Screening     Status: None   Collection Time: 02/25/15  9:34 PM  Result Value Ref Range Status   MRSA by PCR NEGATIVE NEGATIVE Final    Comment:        The GeneXpert MRSA Assay (FDA approved for NASAL specimens only), is one component of a comprehensive MRSA colonization surveillance program. It is not intended to diagnose MRSA infection nor to guide or monitor treatment for MRSA infections.   MRSA PCR Screening     Status: None   Collection Time: 02/26/15  3:01 AM  Result Value Ref Range Status   MRSA by PCR NEGATIVE NEGATIVE Final    Comment:        The GeneXpert MRSA Assay (FDA approved for NASAL specimens only), is one  component of a comprehensive MRSA colonization surveillance program. It is not intended to diagnose MRSA infection nor to guide or monitor treatment for MRSA infections.      Studies: Dg Chest Port 1 View  03/01/2015  CLINICAL DATA:  Pneumonia EXAM: PORTABLE CHEST 1 VIEW COMPARISON:  February 27, 2015 FINDINGS: There is patchy atelectasis in the right mid lung and left base regions. There is no frank edema or consolidation. Heart is upper normal in size with pulmonary vascularity within normal limits. No adenopathy. There is degenerative change in both shoulders and in the thoracic spine. There is atherosclerotic calcification in ascending aortic region, stable. IMPRESSION: Areas of patchy atelectasis bilaterally. No frank edema or consolidation. No change in cardiac silhouette. Electronically Signed   By: Lowella Grip III M.D.   On: 03/01/2015 07:23   Dg Swallowing Func-speech Pathology  02/27/2015  Objective Swallowing Evaluation:   MBS Patient Details Name: MARWAH DISBRO MRN: [id number] Date of Birth: 07-24-1927 Today's Date: 02/27/2015 Time: SLP Start Time (ACUTE ONLY): 1110-SLP Stop Time (ACUTE ONLY): 1125 SLP Time Calculation (min) (ACUTE ONLY): 15 min Past Medical History: Past Medical History Diagnosis Date . Hypothyroidism  . Esophageal reflux  . Seasonal allergies  . Hypercholesteremia  . Depression  . Anxiety  . History of diverticulitis of colon  . History of peptic ulcer  . Chronic back pain    "gets it worse as it goes down" (02/24/2015) . Bladder prolapse, female, acquired  . Heart murmur  . History of electroconvulsive therapy 1956   "then had insulin shock; for deep deep depression" . History of hiatal hernia  . Migraine    "might have one maybe once/month" (02/24/2015) . Arthritis    "in my back" (02/24/2015) . Breast cancer, left breast ([place]) 1970 . Non Hodgkin's lymphoma ([place]) 2001 . Basal cell carcinoma of nasal tip  Past Surgical History: Past Surgical History Procedure  Laterality Date . Lumbar laminectomy  2012 . Mastectomy, radical Left 1970 . Back surgery   . Abdominal hysterectomy     "took out my uterus only" . Cataract extraction w/ intraocular lens  implant, bilateral   . Breast biopsy Left 1970 . Bone marrow biopsy  2001   "for non-Hodgkin's lymphoma" HPI: 81 y.o. female with a past medical history that includes hypothyroidism, GERD, anxiety, breast cancer, chronic back pain presents to the emergency department from independent living with the chief complaint of persistent cough. Found to have acute respiratory failure likely related to CAP. CXR partial clearing of bilateral pulmonary infiltrates and or edema. No Data Recorded Assessment / Plan / Recommendation CHL IP CLINICAL IMPRESSIONS 02/27/2015 Therapy Diagnosis Suspected primary esophageal dysphagia Clinical Impression  Pt exhibits a suspected primarily esophageal dysphagia evidenced by stasis in distal esophagus which did not pass GE junction during MBS. She is aware of her hiatal hernia and her reflux.  Pt's oral and pharyngeal phases of swallow were within functional limits during. Bolus formation, mastication and transit was swift and effictive. Swallow initiation timely, adequate laryngeal elevation without penetration or aspiration. Pt may have aspirated reflux as she admits to laying down to eat when she is tired. Recommend regular texture, thin liquids, sit up for all meals/snacks and remain upright for one hour, alternate liquids and solids. No ST follow up needed.   Impact on safety and function Mild aspiration risk   CHL IP TREATMENT RECOMMENDATION 02/27/2015 Treatment Recommendations No treatment recommended at this time   No flowsheet data found. CHL IP DIET RECOMMENDATION 02/27/2015 SLP Diet Recommendations Regular solids;Thin liquid Liquid Administration via Cup;Straw Medication Administration Whole meds with liquid Compensations -- Postural Changes Remain semi-upright after after feeds/meals  (Comment);Seated upright at 90 degrees   CHL IP OTHER RECOMMENDATIONS 02/27/2015 Recommended Consults -- Oral Care Recommendations Oral care BID Other Recommendations --   CHL IP FOLLOW UP RECOMMENDATIONS 02/27/2015 Follow up Recommendations None   No flowsheet data found.     CHL IP ORAL PHASE 02/27/2015 Oral Phase WFL Oral - Pudding Teaspoon -- Oral - Pudding Cup -- Oral - Honey Teaspoon -- Oral - Honey Cup -- Oral - Nectar Teaspoon -- Oral - Nectar Cup -- Oral - Nectar Straw -- Oral - Thin Teaspoon -- Oral - Thin Cup -- Oral - Thin Straw -- Oral - Puree -- Oral - Mech Soft -- Oral - Regular -- Oral - Multi-Consistency -- Oral - Pill -- Oral Phase - Comment --  CHL IP PHARYNGEAL PHASE 02/27/2015 Pharyngeal Phase WFL Pharyngeal- Pudding Teaspoon -- Pharyngeal -- Pharyngeal- Pudding Cup -- Pharyngeal -- Pharyngeal- Honey Teaspoon -- Pharyngeal -- Pharyngeal- Honey Cup -- Pharyngeal -- Pharyngeal- Nectar Teaspoon -- Pharyngeal -- Pharyngeal- Nectar Cup -- Pharyngeal -- Pharyngeal- Nectar Straw -- Pharyngeal -- Pharyngeal- Thin Teaspoon -- Pharyngeal -- Pharyngeal- Thin Cup -- Pharyngeal -- Pharyngeal- Thin Straw -- Pharyngeal -- Pharyngeal- Puree -- Pharyngeal -- Pharyngeal- Mechanical Soft -- Pharyngeal -- Pharyngeal- Regular -- Pharyngeal -- Pharyngeal- Multi-consistency -- Pharyngeal -- Pharyngeal- Pill -- Pharyngeal -- Pharyngeal Comment --  CHL IP CERVICAL ESOPHAGEAL PHASE 02/27/2015 Cervical Esophageal Phase WFL Pudding Teaspoon -- Pudding Cup -- Honey Teaspoon -- Honey Cup -- Nectar Teaspoon -- Nectar Cup -- Nectar Straw -- Thin Teaspoon -- Thin Cup -- Thin Straw -- Puree -- Mechanical Soft -- Regular -- Multi-consistency -- Pill -- Cervical Esophageal Comment -- [place] Siren 02/27/2015, 1:37 PM Orbie Pyo Litaker M.Ed CCC-SLP Pager [phone]               Scheduled Meds: . albuterol  2.5 mg Nebulization TID  . antiseptic oral rinse  7 mL Mouth Rinse BID  . buPROPion  300 mg Oral Daily  . calcium  carbonate  0.5 tablet Oral Daily  . dextromethorphan-guaiFENesin  1 tablet Oral BID  . enoxaparin (LOVENOX) injection  40 mg Subcutaneous Q24H  . iron polysaccharides  150 mg Oral Daily  . levothyroxine  88 mcg Oral QAC breakfast  . pantoprazole  40 mg Oral Daily  . piperacillin-tazobactam (ZOSYN)  IV  3.375 g Intravenous 3 times per day  . simvastatin  20 mg Oral q1800  . vancomycin  500 mg Intravenous Q12H   Continuous Infusions:    Principal Problem:  Acute respiratory failure with hypoxia (HCC) Active Problems:   HCAP (healthcare-associated pneumonia)   Tachycardia   Hypothyroidism   Chronic back pain   Anxiety   Falls frequently    Time spent: 25 min    Kelvin Cellar  [facility] Pager [phone]. If 7PM-7AM, please contact night-coverage at [email or web address], password [facility] 03/01/2015, 7:54 AM  LOS: 5 days

## 2015-03-02 MED ORDER — CEFUROXIME AXETIL 500 MG PO TABS
500.0000 mg | ORAL_TABLET | Freq: Two times a day (BID) | ORAL | Status: DC
  Administered 2015-03-02 – 2015-03-03 (×3): 500 mg via ORAL
  Filled 2015-03-02 (×3): qty 1

## 2015-03-02 NOTE — NC FL2 (Signed)
[place] LEVEL OF CARE SCREENING TOOL     IDENTIFICATION  Patient Name: Dana [name] Birthdate: 01-18-1928 Sex: female Admission Date (Current Location): 02/24/2015  [facility] and [place] Number: Herbalist and Address:  The . [facility] [place] [address] [place], [place] [address]      Provider Number: [id number]  Attending Physician Name and Address:  Kelvin Cellar, MD  Relative Name and Phone Number:       Current Level of Care: Hospital Recommended Level of Care: [place] Prior Approval Number:    Date Approved/Denied:   PASRR Number: [id number]  Discharge Plan: SNF    Current Diagnoses: Patient Active Problem List   Diagnosis Date Noted  . HCAP (healthcare-associated pneumonia) 02/27/2015  . Acute respiratory failure with hypoxia ([place]) 02/24/2015  . CAP (community acquired pneumonia) 02/24/2015  . Tachycardia 02/24/2015  . Falls frequently 02/24/2015  . Hypothyroidism   . Chronic back pain   . History of breast cancer   . History of peptic ulcer   . Anxiety     Orientation RESPIRATION BLADDER Height & Weight    Self, Time, Place  O2 (2L [place]) Incontinent 5' (152.4 cm) 157 lbs.  BEHAVIORAL SYMPTOMS/MOOD NEUROLOGICAL BOWEL NUTRITION STATUS      Continent Diet (cardiac)  AMBULATORY STATUS COMMUNICATION OF NEEDS Skin   Extensive Assist Verbally Normal                       Personal Care Assistance Level of Assistance  Bathing, Dressing Bathing Assistance: Limited assistance   Dressing Assistance: Limited assistance     Functional Limitations Info  Sight, Hearing Sight Info: Impaired Hearing Info: Impaired      SPECIAL CARE FACTORS FREQUENCY  PT (By licensed PT)     PT Frequency: 5/wk              Contractures      Additional Factors Info  Code Status, Allergies Code Status Info: FULL Allergies Info: Amoxicillin, Celexa, Iohexol, Pollen Extract, Prozac, Promethazine,  Shrimp           Current Medications (03/02/2015):  This is the current hospital active medication list Current Facility-Administered Medications  Medication Dose Route Frequency Provider Last Rate Last Dose  . acetaminophen (TYLENOL) tablet 1,000 mg  1,000 mg Oral TID PRN Kelvin Cellar, MD   1,000 mg at 03/02/15 0928  . albuterol (PROVENTIL) (2.5 MG/3ML) 0.083% nebulizer solution 2.5 mg  2.5 mg Nebulization TID Waldemar Dickens, MD   2.5 mg at 03/02/15 0900  . albuterol (PROVENTIL) (2.5 MG/3ML) 0.083% nebulizer solution 2.5 mg  2.5 mg Nebulization Q2H PRN Kelvin Cellar, MD   2.5 mg at 03/01/15 1854  . antiseptic oral rinse (CPC / CETYLPYRIDINIUM CHLORIDE 0.05%) solution 7 mL  7 mL Mouth Rinse BID Kelvin Cellar, MD   7 mL at 03/02/15 1000  . buPROPion (WELLBUTRIN XL) 24 hr tablet 300 mg  300 mg Oral Daily Radene Gunning, NP   300 mg at 03/02/15 [id number]  . calcium carbonate (OS-CAL - dosed in mg of elemental calcium) tablet 250 mg of elemental calcium  0.5 tablet Oral Daily Waldemar Dickens, MD   250 mg of elemental calcium at 03/02/15 0928  . cefUROXime (CEFTIN) tablet 500 mg  500 mg Oral BID WC Kelvin Cellar, MD      . dextromethorphan-guaiFENesin [facility] DM) 30-600 MG per 12 hr tablet 1 tablet  1 tablet Oral BID  Kelvin Cellar, MD   1 tablet at 03/02/15 [phone]  . diazepam (VALIUM) tablet 5 mg  5 mg Oral Daily PRN Radene Gunning, NP   5 mg at 03/01/15 2201  . enoxaparin (LOVENOX) injection 40 mg  40 mg Subcutaneous Q24H Kelvin Cellar, MD   40 mg at 03/01/15 1743  . hydrOXYzine (ATARAX/VISTARIL) tablet 10 mg  10 mg Oral Q6H PRN Radene Gunning, NP      . iron polysaccharides (NIFEREX) capsule 150 mg  150 mg Oral Daily Radene Gunning, NP   150 mg at 03/02/15 [id number]  . levothyroxine (SYNTHROID, LEVOTHROID) tablet 88 mcg  88 mcg Oral QAC breakfast Eudelia Bunch, RPH   88 mcg at 03/02/15 [id number]  . pantoprazole (PROTONIX) EC tablet 40 mg  40 mg Oral Daily Radene Gunning, NP   40 mg at 03/02/15 [id number]  .  polyethylene glycol powder (GLYCOLAX/MIRALAX) container 255 g  1 Container Oral Daily PRN Radene Gunning, NP      . simvastatin (ZOCOR) tablet 20 mg  20 mg Oral q1800 Radene Gunning, NP   20 mg at 03/01/15 1743     Discharge Medications: Please see discharge summary for a list of discharge medications.  Relevant Imaging Results:  Relevant Lab Results:   Additional Information SS#: [id number]  Cranford Mon, [place]

## 2015-03-02 NOTE — Care Management Important Message (Signed)
Important Message  Patient Details  Name: Dana [name] MRN: [id number] Date of Birth: Feb 13, 1928   Medicare Important Message Given:  Yes    [name] [name] 03/02/2015, 2:08 PM

## 2015-03-02 NOTE — Progress Notes (Signed)
PT Cancellation Note  Patient Details Name: Dana [name] MRN: [id number] DOB: 1927-11-07   Cancelled Treatment:    Reason Eval/Treat Not Completed: Fatigue/lethargy limiting ability to participate;patient lethargic, but rouseable.  Reports too sleepy to participate this pm and that she was up with nursing earlier today.  Will attempt again tomorrow.   Marleigh Kaylor,CYNDI 03/02/2015, 2:53 PM  Magda Kiel, [place] 03/02/2015

## 2015-03-02 NOTE — Progress Notes (Signed)
[facility] PROGRESS NOTE  Dana [name] [id number] DOB: [birth year] DOA: 02/24/2015 PCP: Reginia Naas, MD  Assessment/Plan:  1.  Acute hypoxemic respiratory failure -Evidence by respiratory rate of 44 with an O2 sat of 87% -Overnight events noted, patient's respiratory status further deteriorating having respiratory rate of 44 requiring BiPAP and transferred to the stepdown unit. A repeat chest x-ray revealed diffuse bilateral airspace opacification worsened from previous study. This could reflect worsening pneumonia versus development of pulmonary edema. -She was given 40 mg of IV Lasix on 02/26/2015 -Dana antimicrobial therapy was broadened, as she is now on IV vancomycin and Zosyn. Pharmacy consulted for dosing. -I personally reviewed repeat CXR from 03/01/2015 which showed stable infiltrates compared to prior study on 02/27/2015 -She is showing gradual clinical improvement, oxygen weaned down to 3L. She was assisted out of bed to chair.  -On 03/02/2015 she seems improved from a respiratory standpoint, will continue titrating oxygen   2.  Suspected healthcare associated pneumonia -Patient presenting with signs symptoms consistent with pneumonia. Chest x-ray performed in the emergency room revealed patchy bilateral densities. -Initially she was treated with empiric IV antibiotic therapy with Levaquin 750 mg every 24 hours. -On the evening of 02/25/2015 she had deterioration in respiratory status requiring transfer to the stepdown unit and being placed on BiPAP. Repeat chest x-ray revealing diffuse bilateral airspace opacification which could reflect worsening pneumonia. -Dana antibiotic regimen was broadened on 02/26/2015, now on IV Zosyn and Vancomycin -Blood cultures drawn on 02/24/2015 showing no growth to date. -Continue close monitoring in the step down unit. Repeat chest x-ray on 02/27/2015 showing improvement -On 02/28/2015 I think overall she is showing improvement. Off  NPPV, weaning O2 to 4L [place]. -Plan to monitor one more day in step down on broad spectrum AB's and narrow regimen in am if she continues to show improvement.  -Repeat CXR performed on 03/01/2015 showing stable infiltrates. Clinically continues to improve, with decrease in oxygen requirement. -On 03/02/2015 Dana Iv zosyn was discontinued after receiving 6 days of IV antimicrobial therapy, transitioned to Ceftin 500 mg PO BID -Plan to repeat CXR in am.   3.  Delirium -Patient is confused, disoriented, having an altered mental state. This has likely resulted from multiple factors including hospitalization, acute illness, benzodiazepine therapy, respiratory failure. -Treating medical illness, providing respiratory support -She remains confused and disoriented. We assisted Dana out of bed to chair and she is quite deconditioned. PT has been consulted. Will attempt to ambulate Dana today.  -Will need SNF placement after this hospitalization  4.  History of recurrent falls. -Suspect history of sciatica contributing -Physical therapy consult when medically stable  5.  Hypothyroidism -Continue Synthroid 88 g by mouth daily.  6. Nutrition -She was seen by speech on 02/27/2015 -Currently on regular diet which she is tolerating.   7.  Hypokalemia -Likely secondary to Lasix -AM labs 03/01/2015 showing K of 3.0, will replace with oral potassium.  -Repeat labs on 03/01/2015 showing K of 4.4  Code Status: Full Code Family Communication: I spoke to Dana [name] over telephone conversation and updated Dana on patient's condition Disposition Plan: Transfer out of SDU to tele   Antibiotics:  IV levauqin stopped on 02/26/2015  IV vancomycin started on 02/26/2015  IV Zosyn started on 02/26/2015 stopped on 03/02/2015  Ceftin 500 mg PO BID started on 03/02/2015  HPI/Subjective: Dana [name] is a pleasant 81 year old with a history of hypothyroidism, gastric soft reflux disease, generalized anxiety,  history of breast cancer admitted to  the medicine service on 02/24/2015 which she presented with complaints of cough associate with shortness of breath. She is currently a resident at independent living facility. Initial workup included chest x-ray that showed patchy bilateral densities. She was started on antibiotic therapy with levofloxacin 750 mg daily for possible PNA. On the evening of 02/25/2015 patient's respiratory status further deteriorating, as she was transferred to the stepdown unit and placed on BiPAP. A repeat chest x-ray revealed worsening bilateral airspace opacification which could reflect worsening multifocal pneumonia versus pulmonary edema. Dana antibiotics were broadened as she was changed to Zosyn and vancomycin. She was also given 40 mg of IV Lasix.  Objective: Filed Vitals:   03/02/15 0449 03/02/15 0819  BP: 138/60 135/60  Pulse: 84 95  Temp: 98.5 F (36.9 C) 98.2 F (36.8 C)  Resp: 20 20    Intake/Output Summary (Last 24 hours) at 03/02/15 0953 Last data filed at 03/02/15 [id number]  Gross per 24 hour  Intake    580 ml  Output   1475 ml  Net   -895 ml   Filed Weights   02/24/15 0704 03/01/15 1218 03/02/15 0300  Weight: 70.308 kg (155 lb) 70.806 kg (156 lb 1.6 oz) 71.578 kg (157 lb 12.8 oz)    Exam:   General:  Dana [name] continues to show improvement, was assisted out of chair to bed  Cardiovascular: Regular rate and rhythm, normal S1S2  Respiratory: Improved lung exam, decrease crackles.   Abdomen: soft, nontender, nondistended  Musculoskeletal: no edema  Data Reviewed: Basic Metabolic Panel:  Recent Labs Lab 02/24/15 0738 02/26/15 0755 02/27/15 0805 02/28/15 0320 03/01/15 0236  NA 139 140 139 137 140  K 4.4 4.1 3.4* 3.0* 4.4  CL 105 102 100* 97* 101  CO2 26 28 29  32 29  GLUCOSE 122* 123* 113* 110* 124*  BUN 14 16 16 15 18   CREATININE 0.80 0.76 0.71 0.71 0.86  CALCIUM 9.3 9.0 8.7* 8.6* 8.8*   Liver Function Tests: No results for input(s):  AST, ALT, ALKPHOS, BILITOT, PROT, ALBUMIN in the last 168 hours. No results for input(s): LIPASE, AMYLASE in the last 168 hours. No results for input(s): AMMONIA in the last 168 hours. CBC:  Recent Labs Lab 02/24/15 0738 02/25/15 0539 02/26/15 0755 02/27/15 0805 02/28/15 0320 03/01/15 0236  WBC 13.3* 12.0* 14.3* 10.9* 7.9 8.6  NEUTROABS 10.8*  --   --   --   --  6.5  HGB 13.2 11.1* 12.5 11.7* 10.4* 10.3*  HCT 39.3 34.5* 37.8 36.6 32.2* 31.3*  MCV 99.2 100.0 101.1* 101.1* 99.4 99.7  PLT 258 238 263 242 245 227   Cardiac Enzymes: No results for input(s): CKTOTAL, CKMB, CKMBINDEX, TROPONINI in the last 168 hours. BNP (last 3 results)  Recent Labs  02/26/15 0755  BNP 1346.0*    ProBNP (last 3 results) No results for input(s): PROBNP in the last 8760 hours.  CBG: No results for input(s): GLUCAP in the last 168 hours.  Recent Results (from the past 240 hour(s))  Urine culture     Status: None   Collection Time: 02/24/15  8:47 AM  Result Value Ref Range Status   Specimen Description URINE, CATHETERIZED  Final   Special Requests NONE  Final   Culture NO GROWTH 1 DAY  Final   Report Status 02/25/2015 FINAL  Final  Culture, blood (routine x 2)     Status: None   Collection Time: 02/24/15  9:05 AM  Result Value Ref Range Status  Specimen Description BLOOD RIGHT HAND  Final   Special Requests BOTTLES DRAWN AEROBIC ONLY 5CC  Final   Culture NO GROWTH 5 DAYS  Final   Report Status 03/01/2015 FINAL  Final  Culture, blood (routine x 2)     Status: None   Collection Time: 02/24/15  9:17 AM  Result Value Ref Range Status   Specimen Description BLOOD BLOOD RIGHT FOREARM  Final   Special Requests BOTTLES DRAWN AEROBIC AND ANAEROBIC 5CC  Final   Culture NO GROWTH 5 DAYS  Final   Report Status 03/01/2015 FINAL  Final  MRSA PCR Screening     Status: None   Collection Time: 02/25/15  9:34 PM  Result Value Ref Range Status   MRSA by PCR NEGATIVE NEGATIVE Final    Comment:         The GeneXpert MRSA Assay (FDA approved for NASAL specimens only), is one component of a comprehensive MRSA colonization surveillance program. It is not intended to diagnose MRSA infection nor to guide or monitor treatment for MRSA infections.   MRSA PCR Screening     Status: None   Collection Time: 02/26/15  3:01 AM  Result Value Ref Range Status   MRSA by PCR NEGATIVE NEGATIVE Final    Comment:        The GeneXpert MRSA Assay (FDA approved for NASAL specimens only), is one component of a comprehensive MRSA colonization surveillance program. It is not intended to diagnose MRSA infection nor to guide or monitor treatment for MRSA infections.      Studies: Dg Chest Port 1 View  03/01/2015  CLINICAL DATA:  Pneumonia EXAM: PORTABLE CHEST 1 VIEW COMPARISON:  February 27, 2015 FINDINGS: There is patchy atelectasis in the right mid lung and left base regions. There is no frank edema or consolidation. Heart is upper normal in size with pulmonary vascularity within normal limits. No adenopathy. There is degenerative change in both shoulders and in the thoracic spine. There is atherosclerotic calcification in ascending aortic region, stable. IMPRESSION: Areas of patchy atelectasis bilaterally. No frank edema or consolidation. No change in cardiac silhouette. Electronically Signed   By: Lowella Grip III M.D.   On: 03/01/2015 07:23    Scheduled Meds: . albuterol  2.5 mg Nebulization TID  . antiseptic oral rinse  7 mL Mouth Rinse BID  . buPROPion  300 mg Oral Daily  . calcium carbonate  0.5 tablet Oral Daily  . dextromethorphan-guaiFENesin  1 tablet Oral BID  . enoxaparin (LOVENOX) injection  40 mg Subcutaneous Q24H  . iron polysaccharides  150 mg Oral Daily  . levothyroxine  88 mcg Oral QAC breakfast  . pantoprazole  40 mg Oral Daily  . piperacillin-tazobactam (ZOSYN)  IV  3.375 g Intravenous 3 times per day  . simvastatin  20 mg Oral q1800   Continuous Infusions:     Principal Problem:   Acute respiratory failure with hypoxia (HCC) Active Problems:   HCAP (healthcare-associated pneumonia)   Tachycardia   Hypothyroidism   Chronic back pain   Anxiety   Falls frequently    Time spent: 25 min    Kelvin Cellar  [facility] Pager [phone]. If 7PM-7AM, please contact night-coverage at [email or web address], password [facility] 03/02/2015, 9:53 AM  LOS: 6 days

## 2015-03-03 ENCOUNTER — Inpatient Hospital Stay (HOSPITAL_COMMUNITY): Payer: Medicare Other

## 2015-03-03 DIAGNOSIS — I5021 Acute systolic (congestive) heart failure: Secondary | ICD-10-CM

## 2015-03-03 LAB — CBC
HEMATOCRIT: 32.2 % — AB (ref 36.0–46.0)
Hemoglobin: 10.4 g/dL — ABNORMAL LOW (ref 12.0–15.0)
MCH: 32.1 pg (ref 26.0–34.0)
MCHC: 32.3 g/dL (ref 30.0–36.0)
MCV: 99.4 fL (ref 78.0–100.0)
PLATELETS: 259 10*3/uL (ref 150–400)
RBC: 3.24 MIL/uL — ABNORMAL LOW (ref 3.87–5.11)
RDW: 13.2 % (ref 11.5–15.5)
WBC: 9.9 10*3/uL (ref 4.0–10.5)

## 2015-03-03 LAB — BASIC METABOLIC PANEL
Anion gap: 7 (ref 5–15)
BUN: 12 mg/dL (ref 6–20)
CHLORIDE: 95 mmol/L — AB (ref 101–111)
CO2: 31 mmol/L (ref 22–32)
CREATININE: 0.57 mg/dL (ref 0.44–1.00)
Calcium: 8.9 mg/dL (ref 8.9–10.3)
GFR calc Af Amer: >60 mL/min (ref >60)
GLUCOSE: 118 mg/dL — AB (ref 65–99)
POTASSIUM: 4 mmol/L (ref 3.5–5.1)
Sodium: 133 mmol/L — ABNORMAL LOW (ref 135–145)

## 2015-03-03 MED ORDER — FUROSEMIDE 10 MG/ML IJ SOLN
40.0000 mg | Freq: Once | INTRAMUSCULAR | Status: AC
  Administered 2015-03-03: 40 mg via INTRAVENOUS

## 2015-03-03 MED ORDER — ONDANSETRON HCL 4 MG/2ML IJ SOLN: 4.0000 mg | Freq: Four times a day (QID) | INTRAMUSCULAR | Status: DC

## 2015-03-03 MED ORDER — ONDANSETRON HCL 4 MG/2ML IJ SOLN
4.0000 mg | Freq: Four times a day (QID) | INTRAMUSCULAR | Status: DC | PRN
  Administered 2015-03-03: 4 mg via INTRAVENOUS
  Filled 2015-03-03: qty 2

## 2015-03-03 MED ORDER — CEFUROXIME AXETIL 500 MG PO TABS
500.0000 mg | ORAL_TABLET | Freq: Two times a day (BID) | ORAL | Status: DC
  Administered 2015-03-03 – 2015-03-04 (×2): 500 mg via ORAL
  Filled 2015-03-03 (×2): qty 1

## 2015-03-03 NOTE — Evaluation (Deleted)
Physical Therapy Evaluation Patient Details Name: Dana [name] MRN: [id number] DOB: 30-Jan-1928 Today's Date: 03/03/2015   History of Present Illness  Dana [name] is a 81 y.o. female with a Past Medical History of hyperthyroidism, GERD, depression, anxiety, sensory hallucinations, non-Hodgkin's lymphoma and breast cancer in remission since 2011 who presents with acute respiratory failure.   Clinical Impression  Pt admitted with/for acute respiratory failure.  Pt currently limited functionally due to the problems listed. ( See problems list.)   Pt will benefit from PT to maximize function and safety in order to get ready for next venue listed below.     Follow Up Recommendations SNF;Supervision/Assistance - 24 hour    Equipment Recommendations  Other (comment) (TBA)    Recommendations for Other Services       Precautions / Restrictions Precautions Precautions: Fall      Mobility  Bed Mobility Overal bed mobility: Needs Assistance Bed Mobility: Supine to Sit     Supine to sit: Min assist     General bed mobility comments: min to guide pt up more for encouragement  Transfers Overall transfer level: Needs assistance Equipment used: Rolling walker (2 wheeled) Transfers: Sit to/from Stand Sit to Stand: Min assist         General transfer comment: cues for hand placement and help to come forward.  Ambulation/Gait Ambulation/Gait assistance: Min assist Ambulation Distance (Feet): 60 Feet Assistive device: Rolling walker (2 wheeled) Gait Pattern/deviations: Step-through pattern;Decreased step length - right;Decreased step length - left;Decreased stride length Gait velocity: slow Gait velocity interpretation: Below normal speed for age/gender General Gait Details: As distance increased, posture sagged more, she got further away from the RW and on 2 occasions went down to her elbows.  With cues and minimal truncal support she was kept safely in control.  Stairs             Wheelchair Mobility    Modified Rankin (Stroke Patients Only)       Balance   Sitting-balance support: No upper extremity supported Sitting balance-Leahy Scale: Fair       Standing balance-Leahy Scale: Poor Standing balance comment: reliance on RW or UE support                             Pertinent Vitals/Pain Pain Assessment: No/denies pain    Home Living                        Prior Function                 Hand Dominance        Extremity/Trunk Assessment                         Communication      Cognition Arousal/Alertness: Awake/alert Behavior During Therapy: WFL for tasks assessed/performed Overall Cognitive Status: History of cognitive impairments - at baseline                      General Comments General comments (skin integrity, edema, etc.): Sats during initial transfer at 2L [place] dropped into the low 80's.  On 4L during gait, pt dropped to 86% before quickly returning to 91%    Exercises        Assessment/Plan    PT Assessment    PT Diagnosis     PT Problem List  PT Treatment Interventions     PT Goals (Current goals can be found in the Care Plan section) Acute Rehab PT Goals Patient Stated Goal: to get better PT Goal Formulation: With patient Time For Goal Achievement: 03/13/15 Potential to Achieve Goals: Good    Frequency Min 3X/week   Barriers to discharge        Co-evaluation               End of Session Equipment Utilized During Treatment: Oxygen Activity Tolerance: Patient limited by fatigue Patient left: in chair;with call bell/phone within reach;with chair alarm set Nurse Communication: Mobility status         Time: [id number] PT Time Calculation (min) (ACUTE ONLY): 23 min   Charges:   PT Evaluation $Initial PT Evaluation Tier I: 1 Procedure PT Treatments $Gait Training: 8-22 mins   PT G Codes:        Dana [name], Tessie Fass 03/03/2015, 4:20  PM  03/03/2015  Donnella Sham, PT [phone]  (pager)

## 2015-03-03 NOTE — Clinical Social Work Placement (Signed)
   CLINICAL SOCIAL WORK PLACEMENT  NOTE  Date:  03/03/2015  Patient Details  Name: Dana [name] MRN: [id number] Date of Birth: 10-08-1927  Clinical Social Work is seeking post-discharge placement for this patient at the [place] level of care (*CSW will initial, date and re-position this form in  chart as items are completed):  Yes   Patient/family provided with [facility] Work Department's list of facilities offering this level of care within the geographic area requested by the patient (or if unable, by the patient's family).  Yes   Patient/family informed of their freedom to choose among providers that offer the needed level of care, that participate in Medicare, Medicaid or managed care program needed by the patient, have an available bed and are willing to accept the patient.  Yes   Patient/family informed of [facility]'s ownership interest in [facility] and [facility], as well as of the fact that they are under no obligation to receive care at these facilities.  PASRR submitted to EDS on 03/02/15     PASRR number received on 03/02/15     Existing PASRR number confirmed on       FL2 transmitted to all facilities in geographic area requested by pt/family on 03/02/15     FL2 transmitted to all facilities within larger geographic area on       Patient informed that his/her managed care company has contracts with or will negotiate with certain facilities, including the following:            Patient/family informed of bed offers received.  Patient chooses bed at [facility]     Physician recommends and patient chooses bed at      Patient to be transferred to [facility] on  .  Patient to be transferred to facility by       Patient family notified on   of transfer.  Name of family member notified:        PHYSICIAN Please sign FL2     Additional Comment:     _______________________________________________ Cranford Mon, LCSW 03/03/2015, 2:36 PM

## 2015-03-03 NOTE — Progress Notes (Addendum)
[facility] PROGRESS NOTE  Dana [name] [id number] DOB: 1928/03/13 DOA: 02/24/2015 PCP: Reginia Naas, MD  Interim Summary Dana [name] is a pleasant 81 year old with a history of hypothyroidism, gastric soft reflux disease, generalized anxiety, history of breast cancer admitted to the medicine service on 02/24/2015 which she presented with complaints of cough associate with shortness of breath. She is currently a resident at independent living facility. Initial workup included chest x-ray that showed patchy bilateral densities. She was started on antibiotic therapy with levofloxacin 750 mg daily for possible PNA. On the evening of 02/25/2015 patient's respiratory status further deteriorating, as she was transferred to the stepdown unit and placed on BiPAP. A repeat chest x-ray revealed worsening bilateral airspace opacification which could reflect worsening multifocal pneumonia versus pulmonary edema. Her antibiotics were broadened as she was changed to Zosyn and vancomycin. She was also given 40 mg of IV Lasix. She showed gradual clinical improvement with subsequent CXR's looking better. She was transferred to the floor and transitioned to Ceftin. On 03/03/2015 follow up CXR showed worsening opacities. Unclear if this represents worsening PNA or pulmonary edema. On this date I gave 40 mg of IV lasix and plan to repeat CXR in am of 03/04/2015. D/c to SNF on hold.   Assessment/Plan:  1.  Acute hypoxemic respiratory failure -Evidence by respiratory rate of 44 with an O2 sat of 87% -Overnight events noted, patient's respiratory status further deteriorating having respiratory rate of 44 requiring BiPAP and transferred to the stepdown unit. A repeat chest x-ray revealed diffuse bilateral airspace opacification worsened from previous study. This could reflect worsening pneumonia versus development of pulmonary edema. -She was given 40 mg of IV Lasix on 02/26/2015 -Her antimicrobial therapy  was broadened, as she is now on IV vancomycin and Zosyn. Pharmacy consulted for dosing. -I personally reviewed repeat CXR from 03/01/2015 which showed stable infiltrates compared to prior study on 02/27/2015 -She is showing gradual clinical improvement, oxygen weaned down to 3L. She was assisted out of bed to chair.  -On 03/02/2015 she seems improved from a respiratory standpoint, will continue titrating oxygen  -Repeat CXR on 03/03/2015 showing worsening opacities. Suspect pulmonary edema as opposed to Ceftin failure. Will give IV lasix and repeat CXR in am    2.  Suspected healthcare associated pneumonia -Patient presenting with signs symptoms consistent with pneumonia. Chest x-ray performed in the emergency room revealed patchy bilateral densities. -Initially she was treated with empiric IV antibiotic therapy with Levaquin 750 mg every 24 hours. -On the evening of 02/25/2015 she had deterioration in respiratory status requiring transfer to the stepdown unit and being placed on BiPAP. Repeat chest x-ray revealing diffuse bilateral airspace opacification which could reflect worsening pneumonia. -Her antibiotic regimen was broadened on 02/26/2015, now on IV Zosyn and Vancomycin -Blood cultures drawn on 02/24/2015 showing no growth to date. -Continue close monitoring in the step down unit. Repeat chest x-ray on 02/27/2015 showing improvement -On 02/28/2015 I think overall she is showing improvement. Off NPPV, weaning O2 to 4L [name]. -Plan to monitor one more day in step down on broad spectrum AB's and narrow regimen in am if she continues to show improvement.  -Repeat CXR performed on 03/01/2015 showing stable infiltrates. Clinically continues to improve, with decrease in oxygen requirement. -On 03/02/2015 her IV zosyn was discontinued after receiving 6 days of IV antimicrobial therapy, transitioned to Ceftin 500 mg PO BID -Repeat CXR on 03/03/2015 now showing worsening bilateral airspace disease. She was  transitioned to oral antibiotic therapy  yesterday, I think its too soon to consider this Ceftin failure. This could be related to pulmonary edema. She is afebrile, white count remains with in normal range, nontoxic appearing. Will give Lasix 40 mg IV x 1 dose and repeat a 2-view CXR in am to assess for improvement of lung opacities.    3. Acute combined Systolic and Diastolic CHF -Patient having evidence of volume overload during this hospitalization with labs showing BNP of 1346, initially got IV fluids -TTE performed on 02/28/2015 showed reduced EF of 40-45% with diffuse hypokinesis (No regional wall motion abnormalities)and grade 1 diastolic dysfunciton -Case discussed with Dr Irish Lack of cardiology who recommended medical management. Would not pursue further cardiac workup if she was not having chest pain -Will give Lasix 40 mg IV x 1  4.  Delirium -Patient is confused, disoriented, having an altered mental state. This has likely resulted from multiple factors including hospitalization, acute illness, benzodiazepine therapy, respiratory failure. -Treating medical illness, providing respiratory support -She remains confused and disoriented. We assisted her out of bed to chair and she is quite deconditioned. PT is working with her -Will need SNF placement after this hospitalization  5.  History of recurrent falls. -Suspect history of sciatica contributing -Physical therapy consult when medically stable  6.  Hypothyroidism -Continue Synthroid 88 g by mouth daily.  7. Nutrition -She was seen by speech on 02/27/2015 -Currently on regular diet which she is tolerating.   8.  Hypokalemia -Likely secondary to Lasix -AM labs 03/01/2015 showing K of 3.0, will replace with oral potassium.  -Repeat labs on 03/03/2015 showing K of 4.0  Code Status: Full Code Family Communication: I spoke to her daughter Dana [name] over telephone conversation and updated her on patient's condition Disposition Plan: CXR  showing interval increase in opacities. Giving IV lasix. Anticipate discharge to SNF when medically stable   Antibiotics:  IV levauqin stopped on 02/26/2015  IV vancomycin started on 02/26/2015  IV Zosyn started on 02/26/2015 stopped on 03/02/2015  Ceftin 500 mg PO BID started on 03/02/2015  HPI/Subjective: Patient remains confused and disoriented. She ambulated around her room yesterday  Objective: Filed Vitals:   03/03/15 0515 03/03/15 0756  BP: 125/53 111/48  Pulse: 95 95  Temp: 97.3 F (36.3 C) 98.2 F (36.8 C)  Resp: 17 18    Intake/Output Summary (Last 24 hours) at 03/03/15 1214 Last data filed at 03/03/15 1119  Gross per 24 hour  Intake    600 ml  Output   1452 ml  Net   -852 ml   Filed Weights   03/01/15 1218 03/02/15 0300 03/03/15 0515  Weight: 70.806 kg (156 lb 1.6 oz) 71.578 kg (157 lb 12.8 oz) 71.532 kg (157 lb 11.2 oz)    Exam:   General:  Dana Friel remains confused.   Cardiovascular: Regular rate and rhythm, normal S1S2  Respiratory: Having increased   Abdomen: soft, nontender, nondistended  Musculoskeletal: no edema  Data Reviewed: Basic Metabolic Panel:  Recent Labs Lab 02/26/15 0755 02/27/15 0805 02/28/15 0320 03/01/15 0236 03/03/15 0507  NA 140 139 137 140 133*  K 4.1 3.4* 3.0* 4.4 4.0  CL 102 100* 97* 101 95*  CO2 28 29 32 29 31  GLUCOSE 123* 113* 110* 124* 118*  BUN 16 16 15 18 12   CREATININE 0.76 0.71 0.71 0.86 0.57  CALCIUM 9.0 8.7* 8.6* 8.8* 8.9   Liver Function Tests: No results for input(s): AST, ALT, ALKPHOS, BILITOT, PROT, ALBUMIN in the last 168 hours. No  results for input(s): LIPASE, AMYLASE in the last 168 hours. No results for input(s): AMMONIA in the last 168 hours. CBC:  Recent Labs Lab 02/26/15 0755 02/27/15 0805 02/28/15 0320 03/01/15 0236 03/03/15 0507  WBC 14.3* 10.9* 7.9 8.6 9.9  NEUTROABS  --   --   --  6.5  --   HGB 12.5 11.7* 10.4* 10.3* 10.4*  HCT 37.8 36.6 32.2* 31.3* 32.2*  MCV 101.1*  101.1* 99.4 99.7 99.4  PLT 263 242 245 227 259   Cardiac Enzymes: No results for input(s): CKTOTAL, CKMB, CKMBINDEX, TROPONINI in the last 168 hours. BNP (last 3 results)  Recent Labs  02/26/15 0755  BNP 1346.0*    ProBNP (last 3 results) No results for input(s): PROBNP in the last 8760 hours.  CBG: No results for input(s): GLUCAP in the last 168 hours.  Recent Results (from the past 240 hour(s))  Urine culture     Status: None   Collection Time: 02/24/15  8:47 AM  Result Value Ref Range Status   Specimen Description URINE, CATHETERIZED  Final   Special Requests NONE  Final   Culture NO GROWTH 1 DAY  Final   Report Status 02/25/2015 FINAL  Final  Culture, blood (routine x 2)     Status: None   Collection Time: 02/24/15  9:05 AM  Result Value Ref Range Status   Specimen Description BLOOD RIGHT HAND  Final   Special Requests BOTTLES DRAWN AEROBIC ONLY 5CC  Final   Culture NO GROWTH 5 DAYS  Final   Report Status 03/01/2015 FINAL  Final  Culture, blood (routine x 2)     Status: None   Collection Time: 02/24/15  9:17 AM  Result Value Ref Range Status   Specimen Description BLOOD BLOOD RIGHT FOREARM  Final   Special Requests BOTTLES DRAWN AEROBIC AND ANAEROBIC 5CC  Final   Culture NO GROWTH 5 DAYS  Final   Report Status 03/01/2015 FINAL  Final  MRSA PCR Screening     Status: None   Collection Time: 02/25/15  9:34 PM  Result Value Ref Range Status   MRSA by PCR NEGATIVE NEGATIVE Final    Comment:        The GeneXpert MRSA Assay (FDA approved for NASAL specimens only), is one component of a comprehensive MRSA colonization surveillance program. It is not intended to diagnose MRSA infection nor to guide or monitor treatment for MRSA infections.   MRSA PCR Screening     Status: None   Collection Time: 02/26/15  3:01 AM  Result Value Ref Range Status   MRSA by PCR NEGATIVE NEGATIVE Final    Comment:        The GeneXpert MRSA Assay (FDA approved for NASAL  specimens only), is one component of a comprehensive MRSA colonization surveillance program. It is not intended to diagnose MRSA infection nor to guide or monitor treatment for MRSA infections.      Studies: Dg Chest 2 View  03/03/2015  CLINICAL DATA:  Acute respiratory failure with hypoxia, healthcare associated pneumonia history breast malignancy and non-Hodgkin's lymphoma. EXAM: CHEST  2 VIEW COMPARISON:  Portable chest x-ray of March 01, 2015 FINDINGS: There has been interval deterioration in the appearance of low both lungs with patchy interstitial and alveolar opacities increasing bilaterally. The left hemidiaphragm is largely obscured. The cardiac silhouette remains enlarged. The pulmonary vascularity is not clearly engorged and is indistinct. The bony thorax exhibits no acute abnormality. IMPRESSION: Worsening of bilateral airspace disease most compatible with  pneumonia. Stable cardiomegaly. Atypical pulmonary edema could produce similar findings. Electronically Signed   By: David  Martinique M.D.   On: 03/03/2015 07:49    Scheduled Meds: . albuterol  2.5 mg Nebulization TID  . antiseptic oral rinse  7 mL Mouth Rinse BID  . buPROPion  300 mg Oral Daily  . calcium carbonate  0.5 tablet Oral Daily  . cefUROXime  500 mg Oral BID WC  . dextromethorphan-guaiFENesin  1 tablet Oral BID  . enoxaparin (LOVENOX) injection  40 mg Subcutaneous Q24H  . iron polysaccharides  150 mg Oral Daily  . levothyroxine  88 mcg Oral QAC breakfast  . pantoprazole  40 mg Oral Daily  . simvastatin  20 mg Oral q1800   Continuous Infusions:    Principal Problem:   Acute respiratory failure with hypoxia (HCC) Active Problems:   HCAP (healthcare-associated pneumonia)   Tachycardia   Hypothyroidism   Chronic back pain   Anxiety   Falls frequently    Time spent: 25 min    Kelvin Cellar  [facility] Pager [phone]. If 7PM-7AM, please contact night-coverage at [email or web address], password  [facility] 03/03/2015, 12:14 PM  LOS: 7 days

## 2015-03-03 NOTE — Progress Notes (Signed)
Physical Therapy Treatment Patient Details Name: Dana [name] MRN: [id number] DOB: [birth year] Today's Date: 03/03/2015    History of Present Illness Dana [name] is a 81 y.o. female with a Past Medical History of hyperthyroidism, GERD, depression, anxiety, sensory hallucinations, non-Hodgkin's lymphoma and breast cancer in remission since 2011 who presents with acute respiratory failure.     PT Comments    Progressing slowly due to pt is self-limiting.  With encouragement she did participate with emphasis on transitions, transfers and progressive ambulation.  Follow Up Recommendations  SNF;Supervision/Assistance - 24 hour     Equipment Recommendations  Other (comment) (TBA)    Recommendations for Other Services       Precautions / Restrictions Precautions Precautions: Fall    Mobility  Bed Mobility Overal bed mobility: Needs Assistance Bed Mobility: Supine to Sit     Supine to sit: Min assist     General bed mobility comments: min to guide pt up more for encouragement  Transfers Overall transfer level: Needs assistance Equipment used: Rolling walker (2 wheeled) Transfers: Sit to/from Stand Sit to Stand: Min assist         General transfer comment: cues for hand placement and help to come forward.  Ambulation/Gait Ambulation/Gait assistance: Min assist Ambulation Distance (Feet): 60 Feet Assistive device: Rolling walker (2 wheeled) Gait Pattern/deviations: Step-through pattern;Decreased step length - right;Decreased step length - left;Decreased stride length Gait velocity: slow Gait velocity interpretation: Below normal speed for age/gender General Gait Details: As distance increased, posture sagged more, she got further away from the RW and on 2 occasions went down to her elbows.  With cues and minimal truncal support she was kept safely in control.   Stairs            Wheelchair Mobility    Modified Rankin (Stroke Patients Only)        Balance   Sitting-balance support: No upper extremity supported Sitting balance-Leahy Scale: Fair       Standing balance-Leahy Scale: Poor Standing balance comment: reliance on RW or UE support                    Cognition Arousal/Alertness: Awake/alert Behavior During Therapy: WFL for tasks assessed/performed Overall Cognitive Status: History of cognitive impairments - at baseline                      Exercises      General Comments General comments (skin integrity, edema, etc.): Sats during initial transfer at 2L [place] dropped into the low 80's.  On 4L during gait, pt dropped to 86% before quickly returning to 91%      Pertinent Vitals/Pain Pain Assessment: No/denies pain    Home Living                      Prior Function            PT Goals (current goals can now be found in the care plan section) Acute Rehab PT Goals Patient Stated Goal: to get better PT Goal Formulation: With patient Time For Goal Achievement: 03/13/15 Potential to Achieve Goals: Good Progress towards PT goals: Progressing toward goals    Frequency  Min 3X/week    PT Plan Current plan remains appropriate    Co-evaluation             End of Session Equipment Utilized During Treatment: Oxygen Activity Tolerance: Patient limited by fatigue Patient left: in chair;with call bell/phone  within reach;with chair alarm set     Time: [phone] PT Time Calculation (min) (ACUTE ONLY): 23 min  Charges:  $Gait Training: 8-22 mins $Therapeutic Activity: 8-22 mins                    G Codes:      Damian Hofstra, Tessie Fass 03/03/2015, 4:23 PM 03/03/2015  Donnella Sham, [place] [phone]  (pager)

## 2015-03-03 NOTE — Clinical Social Work Note (Addendum)
Clinical Social Work Assessment  Patient Details  Name: Dana [name] MRN: [id number] Date of Birth: 1927-08-28  Date of referral:  03/02/15               Reason for consult:  Facility Placement                Permission sought to share information with:  Facility Sport and exercise psychologist, Family Supports Permission granted to share information::  Yes, Verbal Permission Granted  Name::     Dana [name]  Agency::  [place] [facility]  Relationship::     Contact Information:     Housing/Transportation Living arrangements for the past 2 months:  [place] Aeronautical engineer) Source of Information:  Patient, Adult Children Patient Interpreter Needed:  None Criminal Activity/Legal Involvement Pertinent to Current Situation/Hospitalization:  No - Comment as needed Significant Relationships:  Adult Children Lives with:  Facility Resident, Self Do you feel safe going back to the place where you live?  No Need for family participation in patient care:  Yes (Comment) (help with decision making)  Care giving concerns:  Pt lives in [place] facility alone- currently unable to ambulate or do ADLs properly- not enough assistance available at facility to care for her needs   Social Worker assessment / plan:  CSW informed of pt need for SNF prior to returning to [place]- pt has been confused during admission so CSW spoke with pt, pt dtr, [name], and pt son, [name], concerning PT recommendation for SNF.   Employment status:  Retired Forensic scientist:  Medicare PT Recommendations:  [place] / Referral to community resources:  [place]  Patient/Family's Response to care:   Family is agreeable to SNF placement- do not think pt is safe to return to ILF with current impairment.  CSW spoke with pt who was somewhat disoriented/ repetitive in speech- pt was initially not agreeable to SNF due to financial concerns but after speaking with MD  and CSW extensively pt is now agreeable to placement for short term rehab with goal of returning to [place].  Patient/Family's Understanding of and Emotional Response to Diagnosis, Current Treatment, and Prognosis:  No questions or concerns at this time- pt does not seem to have good grasp of current medical condition.  Emotional Assessment Appearance:  Appears stated age Attitude/Demeanor/Rapport:  Apprehensive, Complaining Affect (typically observed):  Anxious Orientation:  Oriented to  Time, Oriented to Place, Oriented to Self Alcohol / Substance use:  Not Applicable Psych involvement (Current and /or in the community):  No (Comment)  Discharge Needs  Concerns to be addressed:  Care Coordination Readmission within the last 30 days:  No Current discharge risk:  Physical Impairment, Lives alone Barriers to Discharge:  Continued Medical Work up   [employer], LCSW 03/03/2015, 2:27 PM

## 2015-03-03 NOTE — Progress Notes (Signed)
Pt family chooses [facility]- facility can accept pt when ready for DC.  CSW will continue to follow  [name], [place] Social Worker [phone]

## 2015-03-04 ENCOUNTER — Inpatient Hospital Stay (HOSPITAL_COMMUNITY): Payer: Medicare Other

## 2015-03-04 DIAGNOSIS — M549 Dorsalgia, unspecified: Secondary | ICD-10-CM

## 2015-03-04 DIAGNOSIS — E038 Other specified hypothyroidism: Secondary | ICD-10-CM

## 2015-03-04 DIAGNOSIS — J189 Pneumonia, unspecified organism: Principal | ICD-10-CM

## 2015-03-04 DIAGNOSIS — G8929 Other chronic pain: Secondary | ICD-10-CM

## 2015-03-04 DIAGNOSIS — I5041 Acute combined systolic (congestive) and diastolic (congestive) heart failure: Secondary | ICD-10-CM

## 2015-03-04 DIAGNOSIS — R000 Tachycardia, unspecified: Secondary | ICD-10-CM

## 2015-03-04 DIAGNOSIS — J9601 Acute respiratory failure with hypoxia: Secondary | ICD-10-CM

## 2015-03-04 LAB — BASIC METABOLIC PANEL
ANION GAP: 9 (ref 5–15)
BUN: 14 mg/dL (ref 6–20)
CALCIUM: 9 mg/dL (ref 8.9–10.3)
CO2: 30 mmol/L (ref 22–32)
CREATININE: 0.65 mg/dL (ref 0.44–1.00)
Chloride: 93 mmol/L — ABNORMAL LOW (ref 101–111)
Glucose, Bld: 103 mg/dL — ABNORMAL HIGH (ref 65–99)
Potassium: 3.6 mmol/L (ref 3.5–5.1)
Sodium: 132 mmol/L — ABNORMAL LOW (ref 135–145)

## 2015-03-04 LAB — CBC
HCT: 31.1 % — ABNORMAL LOW (ref 36.0–46.0)
HEMOGLOBIN: 10.4 g/dL — AB (ref 12.0–15.0)
MCH: 32.6 pg (ref 26.0–34.0)
MCHC: 33.4 g/dL (ref 30.0–36.0)
MCV: 97.5 fL (ref 78.0–100.0)
PLATELETS: 289 10*3/uL (ref 150–400)
RBC: 3.19 MIL/uL — AB (ref 3.87–5.11)
RDW: 13.3 % (ref 11.5–15.5)
WBC: 9.8 10*3/uL (ref 4.0–10.5)

## 2015-03-04 MED ORDER — FUROSEMIDE 20 MG PO TABS
20.0000 mg | ORAL_TABLET | Freq: Every day | ORAL | Status: DC
  Administered 2015-03-04: 20 mg via ORAL
  Filled 2015-03-04: qty 1

## 2015-03-04 MED ORDER — DIAZEPAM 5 MG PO TABS: 5.0000 mg | ORAL_TABLET | Freq: Every day | ORAL | Status: DC | PRN

## 2015-03-04 MED ORDER — CEFUROXIME AXETIL 500 MG PO TABS: 500.0000 mg | ORAL_TABLET | Freq: Two times a day (BID) | ORAL | Status: DC

## 2015-03-04 MED ORDER — FUROSEMIDE 20 MG PO TABS: 20.0000 mg | ORAL_TABLET | Freq: Every day | ORAL | Status: DC

## 2015-03-04 MED ORDER — POTASSIUM CHLORIDE ER 10 MEQ PO TBCR: 10.0000 meq | EXTENDED_RELEASE_TABLET | Freq: Every day | ORAL | Status: DC

## 2015-03-04 NOTE — Discharge Summary (Addendum)
Physician Discharge Summary  RASHEL OKEEFE ZOX:[id number] DOB: 1927/11/15 DOA: 02/24/2015  PCP: Reginia Naas, MD  Admit date: 02/24/2015 Discharge date: 03/04/2015  Time spent: > 30 minutes  Recommendations for Outpatient Follow-up:  1. Follow up with Dr. Tamala Julian in 1 week 2. Start Lasix 20 mg daily for 7 days then as needed. Recommend repeat renal function in 3 days 3. Daily weights   Discharge Diagnoses:  Principal Problem:   Acute respiratory failure with hypoxia ([place]) Active Problems:   Tachycardia   Hypothyroidism   Chronic back pain   Anxiety   Falls frequently   HCAP (healthcare-associated pneumonia)   Acute combined systolic and diastolic congestive heart failure [facility])   Discharge Condition: stable  Diet recommendation: heart healthy  Filed Weights   03/02/15 0300 03/03/15 0515 03/04/15 0320  Weight: 71.578 kg (157 lb 12.8 oz) 71.532 kg (157 lb 11.2 oz) 69.128 kg (152 lb 6.4 oz)    History of present illness:  See H&P, Labs, Consult and Test reports for all details in brief, patient is a Dana [name] is a pleasant 81 year old with a history of hypothyroidism, gastric soft reflux disease, generalized anxiety, history of breast cancer admitted to the medicine service on 02/24/2015 which she presented with complaints of cough associate with shortness of breath.  Hospital Course:  Acute hypoxemic respiratory failure, multifactorial due to HCAP and acute combined CHF- Evidence by respiratory rate of 44 with an O2 sat of 87% on admission, she was started on Levaquin initially however her respiratory status worsened on 12/7 requiring transfer to SDU and broader antibiotic coverage. A repeat chest x-ray revealed worsening bilateral airspace opacification which could reflect worsening multifocal pneumonia versus pulmonary edema and she was also given 40 mg of IV Lasix. She showed gradual clinical improvement with subsequent CXR's looking better. She was transferred to  the floor and transitioned to Ceftin. On 03/03/2015 follow up CXR showed worsening opacities, which appear improved after IV Lasix on 12/14. She is breathing better, has no complaints and will be discharged on daily Lasix as above. She is to complete 4 additional days of Ceftin for her pneumonia. Acute combined Systolic and Diastolic CHF - Patient having evidence of volume overload during this hospitalization with labs showing BNP of 1346, initially got IV fluids, now improving with Lasix. TTE performed on 02/28/2015 showed reduced EF of 40-45% with diffuse hypokinesis (No regional wall motion abnormalities)and grade 1 diastolic dysfunction. Case discussed with Dr Irish Lack of cardiology who recommended medical management. Would not pursue further cardiac workup if she was not having chest pain. Blood pressure is borderline low normal and would not add ACEI or beta blocker on discharge, but recommend monitoring and adding ACEI and Coreg if BP tolerates. Delirium - Patient with intermittent confusion, multifactorial due to hospitalization, acute illness, benzodiazepine therapy, respiratory failure. History of recurrent falls - Suspect history of sciatica contributing. SNF discharge Hypothyroidism - Continue Synthroid 88 g by mouth daily. Hypokalemia - Likely secondary to Lasix, supplemented. Recommend repeat BMP in 3 days  Procedures:  None    Consultations:  None   Discharge Exam: Filed Vitals:   03/03/15 1928 03/03/15 2100 03/04/15 0320 03/04/15 0756  BP:  99/39 104/40   Pulse:  86 87   Temp:  98.6 F (37 C) 97.7 F (36.5 C)   TempSrc:  Oral Oral   Resp:  16 18   Height:      Weight:   69.128 kg (152 lb 6.4 oz)   SpO2:  91% 99% 95% 94%    General: NAD Cardiovascular: RRR Respiratory: CTA biL  Discharge Instructions Activity:  As tolerated   Get Medicines reviewed and adjusted: Please take all your medications with you for your next visit with your Primary MD  Please request  your Primary MD to go over all hospital tests and procedure/radiological results at the follow up, please ask your Primary MD to get all Hospital records sent to his/her office.  If you experience worsening of your admission symptoms, develop shortness of breath, life threatening emergency, suicidal or homicidal thoughts you must seek medical attention immediately by calling 911 or calling your MD immediately if symptoms less severe.  You must read complete instructions/literature along with all the possible adverse reactions/side effects for all the Medicines you take and that have been prescribed to you. Take any new Medicines after you have completely understood and accpet all the possible adverse reactions/side effects.   Do not drive when taking Pain medications.   Do not take more than prescribed Pain, Sleep and Anxiety Medications  Special Instructions: If you have smoked or chewed Tobacco in the last 2 yrs please stop smoking, stop any regular Alcohol and or any Recreational drug use.  Wear Seat belts while driving.  Please note  You were cared for by a hospitalist during your hospital stay. Once you are discharged, your primary care physician will handle any further medical issues. Please note that NO REFILLS for any discharge medications will be authorized once you are discharged, as it is imperative that you return to your primary care physician (or establish a relationship with a primary care physician if you do not have one) for your aftercare needs so that they can reassess your need for medications and monitor your lab values.    Medication List    TAKE these medications        calcium citrate 950 MG tablet  Commonly known as:  CALCITRATE - dosed in mg elemental calcium  Take 200 mg of elemental calcium by mouth daily.     cefUROXime 500 MG tablet  Commonly known as:  CEFTIN  Take 1 tablet (500 mg total) by mouth 2 (two) times daily with a meal.     diazepam 5 MG  tablet  Commonly known as:  VALIUM  Take 1 tablet (5 mg total) by mouth daily as needed for anxiety or muscle spasms (and nausea).     furosemide 20 MG tablet  Commonly known as:  LASIX  Take 1 tablet (20 mg total) by mouth daily. Daily for 7 days then as needed for fluid buildup     hydrOXYzine 10 MG tablet  Commonly known as:  ATARAX/VISTARIL  Take 1 tablet (10 mg total) by mouth every 6 (six) hours as needed for itching.     iron polysaccharides 150 MG capsule  Commonly known as:  NIFEREX  Take 150 mg by mouth daily.     MIRALAX powder  Generic drug:  polyethylene glycol powder  Take 1 Container by mouth daily as needed for mild constipation.     multivitamin with minerals Tabs tablet  Take 1 tablet by mouth daily.     omeprazole 40 MG capsule  Commonly known as:  PRILOSEC  Take 40 mg by mouth daily.     potassium chloride 10 MEQ tablet  Commonly known as:  K-DUR  Take 1 tablet (10 mEq total) by mouth daily. With Lasix. Discontinue if Lasix is to be discontinued  simvastatin 20 MG tablet  Commonly known as:  ZOCOR  Take 20 mg by mouth daily.     SYNTHROID 88 MCG tablet  Generic drug:  levothyroxine  Take 88 mcg by mouth daily before breakfast.     WELLBUTRIN XL 150 MG 24 hr tablet  Generic drug:  buPROPion  Take 300 mg by mouth daily.       Follow-up Information    Follow up with Reginia Naas, MD. Schedule an appointment as soon as possible for a visit in 1 week.   Specialty:  Family Medicine   Contact information:   [address] [place] [id number] [phone]       The results of significant diagnostics from this hospitalization (including imaging, microbiology, ancillary and laboratory) are listed below for reference.    Significant Diagnostic Studies: Dg Chest 1 View  02/27/2015  CLINICAL DATA:  Respiratory failure. EXAM: CHEST 1 VIEW COMPARISON:  02/26/2015 .  02/24/2015.  11/14/2010.  CT 10/31/2006. FINDINGS: Patient  is rotated to the right. Thoracic aorta is tortuous and calcified consistent with atherosclerotic vascular disease. Stable cardiomegaly. Interim partial clearing of bilateral pulmonary infiltrates and or edema. Low lung volumes. No prominent pleural effusion or pneumothorax. IMPRESSION: 1. Partial clearing of bilateral pulmonary infiltrates and or edema. 2. Heart size stable.  Tortuous atherosclerotic aorta . Electronically Signed   By: Marcello Moores  Register   On: 02/27/2015 07:28   Dg Chest 1 View  02/24/2015  CLINICAL DATA:  Multiple recent falls with chest pain, shortness of Breath EXAM: CHEST  1 VIEW COMPARISON:  03/14/2014 FINDINGS: The cardiac shadow is stable. A large hiatal hernia is again identified. Diffuse patchy changes are identified throughout both lungs. Although these may be chronic in nature there are new from the prior exam and some acute component must be considered. No sizable effusion is noted. Aortic calcifications are seen. No bony abnormality is noted. IMPRESSION: Patchy bilateral densities likely representing chronic fibrosis although an acute component cannot be totally excluded. Short-term followup following appropriate therapy is recommended. Electronically Signed   By: Inez Catalina M.D.   On: 02/24/2015 08:19   Dg Chest 2 View  03/04/2015  CLINICAL DATA:  CHF with cough and shortness of breath. EXAM: CHEST  2 VIEW COMPARISON:  03/03/2015 and 03/14/2014 FINDINGS: Prominent interstitial lung markings throughout both lungs concerning for pulmonary edema or atypical infection. Slightly improved aeration in the lungs. Heart size is upper limits of normal but unchanged. No large pleural effusions. No acute bone abnormality. Retrocardiac density is compatible known hiatal hernia. IMPRESSION: Slightly improved aeration in the lungs. There continues to be prominent interstitial lung markings suggestive for interstitial pulmonary edema or atypical infection. Electronically Signed   By: Markus Daft M.D.   On: 03/04/2015 07:55   Dg Chest 2 View  03/03/2015  CLINICAL DATA:  Acute respiratory failure with hypoxia, healthcare associated pneumonia history breast malignancy and non-Hodgkin's lymphoma. EXAM: CHEST  2 VIEW COMPARISON:  Portable chest x-ray of March 01, 2015 FINDINGS: There has been interval deterioration in the appearance of low both lungs with patchy interstitial and alveolar opacities increasing bilaterally. The left hemidiaphragm is largely obscured. The cardiac silhouette remains enlarged. The pulmonary vascularity is not clearly engorged and is indistinct. The bony thorax exhibits no acute abnormality. IMPRESSION: Worsening of bilateral airspace disease most compatible with pneumonia. Stable cardiomegaly. Atypical pulmonary edema could produce similar findings. Electronically Signed   By: David  Martinique M.D.   On: 03/03/2015  07:49   Dg Chest Port 1 View  03/01/2015  CLINICAL DATA:  Pneumonia EXAM: PORTABLE CHEST 1 VIEW COMPARISON:  February 27, 2015 FINDINGS: There is patchy atelectasis in the right mid lung and left base regions. There is no frank edema or consolidation. Heart is upper normal in size with pulmonary vascularity within normal limits. No adenopathy. There is degenerative change in both shoulders and in the thoracic spine. There is atherosclerotic calcification in ascending aortic region, stable. IMPRESSION: Areas of patchy atelectasis bilaterally. No frank edema or consolidation. No change in cardiac silhouette. Electronically Signed   By: Lowella Grip III M.D.   On: 03/01/2015 07:23   Dg Chest Port 1 View  02/26/2015  CLINICAL DATA:  Acute onset of shortness of breath and respiratory distress. Initial encounter. EXAM: PORTABLE CHEST 1 VIEW COMPARISON:  Chest radiograph performed 02/24/2015 FINDINGS: Diffuse bilateral airspace opacification is noted, worsened from the prior study. This may reflect pulmonary edema, right worse than left, or multifocal  pneumonia. Small bilateral pleural effusions are noted. No pneumothorax is seen. The cardiomediastinal silhouette is mildly enlarged. No acute osseous abnormalities are identified. IMPRESSION: Diffuse bilateral airspace opacification is worsened from the prior study. This may reflect pulmonary edema, right worse than left, or multifocal pneumonia. Small bilateral pleural effusions noted. Mild cardiomegaly. Electronically Signed   By: Garald Balding M.D.   On: 02/26/2015 02:07   Dg Swallowing Func-speech Pathology  02/27/2015  Objective Swallowing Evaluation:   MBS Patient Details Name: ALYZAE HAWKEY MRN: [id number] Date of Birth: 1927-04-17 Today's Date: 02/27/2015 Time: SLP Start Time (ACUTE ONLY): 1110-SLP Stop Time (ACUTE ONLY): 1125 SLP Time Calculation (min) (ACUTE ONLY): 15 min Past Medical History: Past Medical History Diagnosis Date . Hypothyroidism  . Esophageal reflux  . Seasonal allergies  . Hypercholesteremia  . Depression  . Anxiety  . History of diverticulitis of colon  . History of peptic ulcer  . Chronic back pain    "gets it worse as it goes down" (02/24/2015) . Bladder prolapse, female, acquired  . Heart murmur  . History of electroconvulsive therapy 1956   "then had insulin shock; for deep deep depression" . History of hiatal hernia  . Migraine    "might have one maybe once/month" (02/24/2015) . Arthritis    "in my back" (02/24/2015) . Breast cancer, left breast ([place]) 1970 . Non Hodgkin's lymphoma ([place]) 2001 . Basal cell carcinoma of nasal tip  Past Surgical History: Past Surgical History Procedure Laterality Date . Lumbar laminectomy  2012 . Mastectomy, radical Left 1970 . Back surgery   . Abdominal hysterectomy     "took out my uterus only" . Cataract extraction w/ intraocular lens  implant, bilateral   . Breast biopsy Left 1970 . Bone marrow biopsy  2001   "for non-Hodgkin's lymphoma" HPI: 81 y.o. female with a past medical history that includes hypothyroidism, GERD, anxiety, breast cancer,  chronic back pain presents to the emergency department from independent living with the chief complaint of persistent cough. Found to have acute respiratory failure likely related to CAP. CXR partial clearing of bilateral pulmonary infiltrates and or edema. No Data Recorded Assessment / Plan / Recommendation CHL IP CLINICAL IMPRESSIONS 02/27/2015 Therapy Diagnosis Suspected primary esophageal dysphagia Clinical Impression Pt exhibits a suspected primarily esophageal dysphagia evidenced by stasis in distal esophagus which did not pass GE junction during MBS. She is aware of her hiatal hernia and her reflux.  Pt's oral and pharyngeal phases of swallow were within functional limits during.  Bolus formation, mastication and transit was swift and effictive. Swallow initiation timely, adequate laryngeal elevation without penetration or aspiration. Pt may have aspirated reflux as she admits to laying down to eat when she is tired. Recommend regular texture, thin liquids, sit up for all meals/snacks and remain upright for one hour, alternate liquids and solids. No ST follow up needed.   Impact on safety and function Mild aspiration risk   CHL IP TREATMENT RECOMMENDATION 02/27/2015 Treatment Recommendations No treatment recommended at this time   No flowsheet data found. CHL IP DIET RECOMMENDATION 02/27/2015 SLP Diet Recommendations Regular solids;Thin liquid Liquid Administration via Cup;Straw Medication Administration Whole meds with liquid Compensations -- Postural Changes Remain semi-upright after after feeds/meals (Comment);Seated upright at 90 degrees   CHL IP OTHER RECOMMENDATIONS 02/27/2015 Recommended Consults -- Oral Care Recommendations Oral care BID Other Recommendations --   CHL IP FOLLOW UP RECOMMENDATIONS 02/27/2015 Follow up Recommendations None   No flowsheet data found.     CHL IP ORAL PHASE 02/27/2015 Oral Phase WFL Oral - Pudding Teaspoon -- Oral - Pudding Cup -- Oral - Honey Teaspoon -- Oral - Honey Cup -- Oral  - Nectar Teaspoon -- Oral - Nectar Cup -- Oral - Nectar Straw -- Oral - Thin Teaspoon -- Oral - Thin Cup -- Oral - Thin Straw -- Oral - Puree -- Oral - Mech Soft -- Oral - Regular -- Oral - Multi-Consistency -- Oral - Pill -- Oral Phase - Comment --  CHL IP PHARYNGEAL PHASE 02/27/2015 Pharyngeal Phase WFL Pharyngeal- Pudding Teaspoon -- Pharyngeal -- Pharyngeal- Pudding Cup -- Pharyngeal -- Pharyngeal- Honey Teaspoon -- Pharyngeal -- Pharyngeal- Honey Cup -- Pharyngeal -- Pharyngeal- Nectar Teaspoon -- Pharyngeal -- Pharyngeal- Nectar Cup -- Pharyngeal -- Pharyngeal- Nectar Straw -- Pharyngeal -- Pharyngeal- Thin Teaspoon -- Pharyngeal -- Pharyngeal- Thin Cup -- Pharyngeal -- Pharyngeal- Thin Straw -- Pharyngeal -- Pharyngeal- Puree -- Pharyngeal -- Pharyngeal- Mechanical Soft -- Pharyngeal -- Pharyngeal- Regular -- Pharyngeal -- Pharyngeal- Multi-consistency -- Pharyngeal -- Pharyngeal- Pill -- Pharyngeal -- Pharyngeal Comment --  CHL IP CERVICAL ESOPHAGEAL PHASE 02/27/2015 Cervical Esophageal Phase WFL Pudding Teaspoon -- Pudding Cup -- Honey Teaspoon -- Honey Cup -- Nectar Teaspoon -- Nectar Cup -- Nectar Straw -- Thin Teaspoon -- Thin Cup -- Thin Straw -- Puree -- Mechanical Soft -- Regular -- Multi-consistency -- Pill -- Cervical Esophageal Comment -- [place] Siren 02/27/2015, 1:37 PM Orbie Pyo Litaker M.Ed CCC-SLP Pager [phone]              Dg Hip Unilat With Pelvis 2-3 Views Right  02/24/2015  CLINICAL DATA:  Multiple falls over the past few days with right leg pain, initial encounter EXAM: DG HIP (WITH OR WITHOUT PELVIS) 2-3V RIGHT COMPARISON:  None. FINDINGS: The pelvic ring is intact. Degenerative changes of the hip joints are noted bilaterally. No definitive fracture or dislocation is noted. Postsurgical changes and degenerative change in the lower lumbar spine is noted. Degenerative change of pubic symphysis is seen as well. IMPRESSION: No acute abnormality noted. Electronically Signed   By:  Inez Catalina M.D.   On: 02/24/2015 08:14   Microbiology: Recent Results (from the past 240 hour(s))  Urine culture     Status: None   Collection Time: 02/24/15  8:47 AM  Result Value Ref Range Status   Specimen Description URINE, CATHETERIZED  Final   Special Requests NONE  Final   Culture NO GROWTH 1 DAY  Final   Report Status 02/25/2015 FINAL  Final  Culture, blood (routine x 2)     Status: None   Collection Time: 02/24/15  9:05 AM  Result Value Ref Range Status   Specimen Description BLOOD RIGHT HAND  Final   Special Requests BOTTLES DRAWN AEROBIC ONLY 5CC  Final   Culture NO GROWTH 5 DAYS  Final   Report Status 03/01/2015 FINAL  Final  Culture, blood (routine x 2)     Status: None   Collection Time: 02/24/15  9:17 AM  Result Value Ref Range Status   Specimen Description BLOOD BLOOD RIGHT FOREARM  Final   Special Requests BOTTLES DRAWN AEROBIC AND ANAEROBIC 5CC  Final   Culture NO GROWTH 5 DAYS  Final   Report Status 03/01/2015 FINAL  Final  MRSA PCR Screening     Status: None   Collection Time: 02/25/15  9:34 PM  Result Value Ref Range Status   MRSA by PCR NEGATIVE NEGATIVE Final    Comment:        The GeneXpert MRSA Assay (FDA approved for NASAL specimens only), is one component of a comprehensive MRSA colonization surveillance program. It is not intended to diagnose MRSA infection nor to guide or monitor treatment for MRSA infections.   MRSA PCR Screening     Status: None   Collection Time: 02/26/15  3:01 AM  Result Value Ref Range Status   MRSA by PCR NEGATIVE NEGATIVE Final    Comment:        The GeneXpert MRSA Assay (FDA approved for NASAL specimens only), is one component of a comprehensive MRSA colonization surveillance program. It is not intended to diagnose MRSA infection nor to guide or monitor treatment for MRSA infections.    Labs: Basic Metabolic Panel:  Recent Labs Lab 02/26/15 0755 02/27/15 0805 02/28/15 0320 03/01/15 0236  03/03/15 0507 03/04/15 0335  NA 140 139 137 140 133* 132*  K 4.1 3.4* 3.0* 4.4 4.0 3.6  CL 102 100* 97* 101 95* 93*  CO2 28 29 32 '29 31 30  ' GLUCOSE 123* 113* 110* 124* 118* 103*  BUN '16 16 15 18 12 14  ' CREATININE 0.76 0.71 0.71 0.86 0.57 0.65  CALCIUM 9.0 8.7* 8.6* 8.8* 8.9 9.0   CBC:  Recent Labs Lab 02/27/15 0805 02/28/15 0320 03/01/15 0236 03/03/15 0507 03/04/15 0335  WBC 10.9* 7.9 8.6 9.9 9.8  NEUTROABS  --   --  6.5  --   --   HGB 11.7* 10.4* 10.3* 10.4* 10.4*  HCT 36.6 32.2* 31.3* 32.2* 31.1*  MCV 101.1* 99.4 99.7 99.4 97.5  PLT 242 245 227 259 289   BNP: BNP (last 3 results)  Recent Labs  02/26/15 0755  BNP 1346.0*    Signed:  Marzetta Board  [facility] 03/04/2015, 12:10 PM

## 2015-03-04 NOTE — Progress Notes (Signed)
Physical Therapy Treatment Patient Details Name: Dana [name] MRN: [id number] DOB: 1927/04/22 Today's Date: 03/04/2015    History of Present Illness Dana [name] is a 81 y.o. female with a Past Medical History of hyperthyroidism, GERD, depression, anxiety, sensory hallucinations, non-Hodgkin's lymphoma and breast cancer in remission since 2011 who presents with acute respiratory failure.     PT Comments    Pt to be DCed to SNF for more therapy  Follow Up Recommendations  SNF;Supervision/Assistance - 24 hour     Equipment Recommendations       Recommendations for Other Services       Precautions / Restrictions Precautions Precautions: Fall Restrictions Weight Bearing Restrictions: No    Mobility  Bed Mobility Overal bed mobility: Needs Assistance Bed Mobility: Supine to Sit     Supine to sit: Min assist        Transfers Overall transfer level: Needs assistance Equipment used: Rolling walker (2 wheeled) Transfers: Sit to/from Stand Sit to Stand: Min guard Stand pivot transfers: Min assist       General transfer comment: cues for hand placement and help to come forward.  Ambulation/Gait Ambulation/Gait assistance: Min assist Ambulation Distance (Feet): 60 Feet Assistive device: Rolling walker (2 wheeled) Gait Pattern/deviations: Decreased step length - right;Decreased step length - left;Trunk flexed;Shuffle     General Gait Details: pt would get lower and i would have her stop and look up and take breath through her nose - 3 separate times during walk.  pt reminded not to speed up when tired   Stairs            Wheelchair Mobility    Modified Rankin (Stroke Patients Only)       Balance                                    Cognition Arousal/Alertness: Awake/alert Behavior During Therapy: WFL for tasks assessed/performed Overall Cognitive Status: History of cognitive impairments - at baseline                       Exercises      General Comments General comments (skin integrity, edema, etc.): at rest - 93bpm and 95% on 2L,  pt walked 60 feet and pulse 106bpm and pulse ox 87% on 4L wth max cues to breath through nose and not talk with actiivity.  pt took 30 seconds to recover to baseline on 4L      Pertinent Vitals/Pain Pain Assessment: 0-10 Pain Location:  (pt with chronic leg pain - she says comes from her back - left leg)    Home Living                      Prior Function            PT Goals (current goals can now be found in the care plan section) Progress towards PT goals: Progressing toward goals    Frequency  Min 3X/week    PT Plan Current plan remains appropriate    Co-evaluation             End of Session Equipment Utilized During Treatment: Gait belt;Oxygen Activity Tolerance: Patient limited by fatigue Patient left: in chair;with chair alarm set;with call bell/phone within reach     Time: 1255-1320 PT Time Calculation (min) (ACUTE ONLY): 25 min  Charges:  $Gait Training: 23-37 mins  G Codes:      Loyal Buba 03/04/2015, 1:30 PM 03/04/2015   Rande Lawman, PT

## 2015-03-04 NOTE — Discharge Instructions (Signed)
Follow with Dana Naas, MD in 5-7 days  Please get a complete blood count and chemistry panel checked by your Primary MD at your next visit, and again as instructed by your Primary MD. Please get your medications reviewed and adjusted by your Primary MD.  Please request your Primary MD to go over all Hospital Tests and Procedure/Radiological results at the follow up, please get all Hospital records sent to your Prim MD by signing hospital release before you go home.  If you had Pneumonia of Lung problems at the Hospital: Please get a 2 view Chest X ray done in 6-8 weeks after hospital discharge or sooner if instructed by your Primary MD.  If you have Congestive Heart Failure: Please call your Cardiologist or Primary MD anytime you have any of the following symptoms:  1) 3 pound weight gain in 24 hours or 5 pounds in 1 week  2) shortness of breath, with or without a dry hacking cough  3) swelling in the hands, feet or stomach  4) if you have to sleep on extra pillows at night in order to breathe  Follow cardiac low salt diet and 1.5 lit/day fluid restriction.  If you have diabetes Accuchecks 4 times/day, Once in AM empty stomach and then before each meal. Log in all results and show them to your primary doctor at your next visit. If any glucose reading is under 80 or above 300 call your primary MD immediately.  If you have Seizure/Convulsions/Epilepsy: Please do not drive, operate heavy machinery, participate in activities at heights or participate in high speed sports until you have seen by Primary MD or a Neurologist and advised to do so again.  If you had Gastrointestinal Bleeding: Please ask your Primary MD to check a complete blood count within one week of discharge or at your next visit. Your endoscopic/colonoscopic biopsies that are pending at the time of discharge, will also need to followed by your Primary MD.  Get Medicines reviewed and adjusted. Please take all your  medications with you for your next visit with your Primary MD  Please request your Primary MD to go over all hospital tests and procedure/radiological results at the follow up, please ask your Primary MD to get all Hospital records sent to his/her office.  If you experience worsening of your admission symptoms, develop shortness of breath, life threatening emergency, suicidal or homicidal thoughts you must seek medical attention immediately by calling 911 or calling your MD immediately  if symptoms less severe.  You must read complete instructions/literature along with all the possible adverse reactions/side effects for all the Medicines you take and that have been prescribed to you. Take any new Medicines after you have completely understood and accpet all the possible adverse reactions/side effects.   Do not drive or operate heavy machinery when taking Pain medications.   Do not take more than prescribed Pain, Sleep and Anxiety Medications  Special Instructions: If you have smoked or chewed Tobacco  in the last 2 yrs please stop smoking, stop any regular Alcohol  and or any Recreational drug use.  Wear Seat belts while driving.  Please note You were cared for by a hospitalist during your hospital stay. If you have any questions about your discharge medications or the care you received while you were in the hospital after you are discharged, you can call the unit and asked to speak with the hospitalist on call if the hospitalist that took care of you is not available. Once  you are discharged, your primary care physician will handle any further medical issues. Please note that NO REFILLS for any discharge medications will be authorized once you are discharged, as it is imperative that you return to your primary care physician (or establish a relationship with a primary care physician if you do not have one) for your aftercare needs so that they can reassess your need for medications and monitor your  lab values.  You can reach the hospitalist office at phone [phone] or fax [phone]   If you do not have a primary care physician, you can call [phone] for a physician referral.  Activity: As tolerated with Full fall precautions use walker/cane & assistance as needed  Diet: heart healthy  Disposition SNF

## 2015-03-27 NOTE — Clinical Social Work Placement (Signed)
   CLINICAL SOCIAL WORK PLACEMENT  NOTE  Date:  03/04/2015 Patient Details  Name: Dana [name] MRN: [id number] Date of Birth: [birth year]  Clinical Social Work is seeking post-discharge placement for this patient at the [place] level of care (*CSW will initial, date and re-position this form in  chart as items are completed):  Yes   Patient/family provided with [place] Work Department's list of facilities offering this level of care within the geographic area requested by the patient (or if unable, by the patient's family).  Yes   Patient/family informed of their freedom to choose among providers that offer the needed level of care, that participate in Medicare, Medicaid or managed care program needed by the patient, have an available bed and are willing to accept the patient.  Yes   Patient/family informed of [place]'s ownership interest in [facility] and [facility], as well as of the fact that they are under no obligation to receive care at these facilities.  PASRR submitted to EDS on 03/02/15     PASRR number received on 03/02/15     Existing PASRR number confirmed on       FL2 transmitted to all facilities in geographic area requested by pt/family on 03/02/15     FL2 transmitted to all facilities within larger geographic area on       Patient informed that his/her managed care company has contracts with or will negotiate with certain facilities, including the following:            Patient/family informed of bed offers received.  Patient chooses bed at [facility]     Physician recommends and patient chooses bed at      Patient to be transferred to [facility] on 03/04/15.  Patient to be transferred to facility by Ambulance Corey Harold)     Patient family notified on 03/04/15 of transfer.  Name of family member notified:  Daughter:  Mitzi Hansen     PHYSICIAN Please sign FL2, Please prepare prescriptions,  Please prepare priority discharge summary, including medications     Additional Comment:  DC to SNF 03/04/15 per [place] of MD. Nursing notified to call report to facility and DC summary sent for review.  Patient and daughter are both agreeable and pleased with bed choice and accept d/c today. Patient hopes to get better to return to [facility] in the future.  No further CSW needs identified.  CSW signing off.     _______________________________________________ Williemae Area, LCSW 03/04/2015  2:33 PM [phone]

## 2015-04-23 ENCOUNTER — Inpatient Hospital Stay (HOSPITAL_COMMUNITY)
Admission: EM | Admit: 2015-04-23 | Discharge: 2015-04-29 | DRG: 291 | Disposition: A | Discharge status: Home Health | Payer: Medicare Other | Attending: Internal Medicine | Admitting: Internal Medicine

## 2015-04-23 ENCOUNTER — Encounter (HOSPITAL_COMMUNITY): Payer: Self-pay | Admitting: Emergency Medicine

## 2015-04-23 ENCOUNTER — Emergency Department (HOSPITAL_COMMUNITY): Payer: Medicare Other

## 2015-04-23 DIAGNOSIS — Z853 Personal history of malignant neoplasm of breast: Secondary | ICD-10-CM

## 2015-04-23 DIAGNOSIS — Z85828 Personal history of other malignant neoplasm of skin: Secondary | ICD-10-CM

## 2015-04-23 DIAGNOSIS — M549 Dorsalgia, unspecified: Secondary | ICD-10-CM

## 2015-04-23 DIAGNOSIS — E7800 Pure hypercholesterolemia, unspecified: Secondary | ICD-10-CM | POA: Diagnosis present

## 2015-04-23 DIAGNOSIS — E039 Hypothyroidism, unspecified: Secondary | ICD-10-CM | POA: Diagnosis present

## 2015-04-23 DIAGNOSIS — J302 Other seasonal allergic rhinitis: Secondary | ICD-10-CM | POA: Diagnosis present

## 2015-04-23 DIAGNOSIS — Z881 Allergy status to other antibiotic agents status: Secondary | ICD-10-CM | POA: Diagnosis not present

## 2015-04-23 DIAGNOSIS — Z91013 Allergy to seafood: Secondary | ICD-10-CM

## 2015-04-23 DIAGNOSIS — R296 Repeated falls: Secondary | ICD-10-CM

## 2015-04-23 DIAGNOSIS — Z87891 Personal history of nicotine dependence: Secondary | ICD-10-CM | POA: Diagnosis not present

## 2015-04-23 DIAGNOSIS — H3530 Unspecified macular degeneration: Secondary | ICD-10-CM | POA: Diagnosis present

## 2015-04-23 DIAGNOSIS — G8929 Other chronic pain: Secondary | ICD-10-CM | POA: Diagnosis present

## 2015-04-23 DIAGNOSIS — Z8711 Personal history of peptic ulcer disease: Secondary | ICD-10-CM | POA: Diagnosis not present

## 2015-04-23 DIAGNOSIS — R9431 Abnormal electrocardiogram [ECG] [EKG]: Secondary | ICD-10-CM | POA: Diagnosis present

## 2015-04-23 DIAGNOSIS — R682 Dry mouth, unspecified: Secondary | ICD-10-CM | POA: Insufficient documentation

## 2015-04-23 DIAGNOSIS — N390 Urinary tract infection, site not specified: Secondary | ICD-10-CM | POA: Diagnosis not present

## 2015-04-23 DIAGNOSIS — K219 Gastro-esophageal reflux disease without esophagitis: Secondary | ICD-10-CM | POA: Diagnosis present

## 2015-04-23 DIAGNOSIS — R0602 Shortness of breath: Secondary | ICD-10-CM | POA: Diagnosis present

## 2015-04-23 DIAGNOSIS — F4323 Adjustment disorder with mixed anxiety and depressed mood: Secondary | ICD-10-CM | POA: Diagnosis not present

## 2015-04-23 DIAGNOSIS — F329 Major depressive disorder, single episode, unspecified: Secondary | ICD-10-CM | POA: Diagnosis present

## 2015-04-23 DIAGNOSIS — B952 Enterococcus as the cause of diseases classified elsewhere: Secondary | ICD-10-CM | POA: Diagnosis present

## 2015-04-23 DIAGNOSIS — F419 Anxiety disorder, unspecified: Secondary | ICD-10-CM | POA: Diagnosis present

## 2015-04-23 DIAGNOSIS — H9190 Unspecified hearing loss, unspecified ear: Secondary | ICD-10-CM | POA: Diagnosis present

## 2015-04-23 DIAGNOSIS — Z888 Allergy status to other drugs, medicaments and biological substances status: Secondary | ICD-10-CM | POA: Diagnosis not present

## 2015-04-23 DIAGNOSIS — Z8572 Personal history of non-Hodgkin lymphomas: Secondary | ICD-10-CM | POA: Diagnosis not present

## 2015-04-23 DIAGNOSIS — I272 Other secondary pulmonary hypertension: Secondary | ICD-10-CM | POA: Diagnosis present

## 2015-04-23 DIAGNOSIS — F0390 Unspecified dementia without behavioral disturbance: Secondary | ICD-10-CM | POA: Diagnosis present

## 2015-04-23 DIAGNOSIS — I350 Nonrheumatic aortic (valve) stenosis: Secondary | ICD-10-CM

## 2015-04-23 DIAGNOSIS — Z9012 Acquired absence of left breast and nipple: Secondary | ICD-10-CM

## 2015-04-23 DIAGNOSIS — Z8249 Family history of ischemic heart disease and other diseases of the circulatory system: Secondary | ICD-10-CM | POA: Diagnosis not present

## 2015-04-23 DIAGNOSIS — J9601 Acute respiratory failure with hypoxia: Secondary | ICD-10-CM | POA: Diagnosis present

## 2015-04-23 DIAGNOSIS — I509 Heart failure, unspecified: Secondary | ICD-10-CM

## 2015-04-23 DIAGNOSIS — R4189 Other symptoms and signs involving cognitive functions and awareness: Secondary | ICD-10-CM | POA: Diagnosis present

## 2015-04-23 DIAGNOSIS — I5041 Acute combined systolic (congestive) and diastolic (congestive) heart failure: Secondary | ICD-10-CM | POA: Diagnosis present

## 2015-04-23 DIAGNOSIS — E876 Hypokalemia: Secondary | ICD-10-CM | POA: Diagnosis not present

## 2015-04-23 DIAGNOSIS — D72829 Elevated white blood cell count, unspecified: Secondary | ICD-10-CM | POA: Diagnosis present

## 2015-04-23 DIAGNOSIS — F09 Unspecified mental disorder due to known physiological condition: Secondary | ICD-10-CM

## 2015-04-23 LAB — URINE MICROSCOPIC-ADD ON

## 2015-04-23 LAB — BASIC METABOLIC PANEL
ANION GAP: 12 (ref 5–15)
BUN: 11 mg/dL (ref 6–20)
CHLORIDE: 101 mmol/L (ref 101–111)
CO2: 30 mmol/L (ref 22–32)
Calcium: 8.7 mg/dL — ABNORMAL LOW (ref 8.9–10.3)
Creatinine, Ser: 0.76 mg/dL (ref 0.44–1.00)
GFR calc non Af Amer: >60 mL/min (ref >60)
Glucose, Bld: 118 mg/dL — ABNORMAL HIGH (ref 65–99)
Potassium: 3.1 mmol/L — ABNORMAL LOW (ref 3.5–5.1)
Sodium: 143 mmol/L (ref 135–145)

## 2015-04-23 LAB — CBC
HEMATOCRIT: 34.8 % — AB (ref 36.0–46.0)
HEMOGLOBIN: 11.2 g/dL — AB (ref 12.0–15.0)
MCH: 30.9 pg (ref 26.0–34.0)
MCHC: 32.2 g/dL (ref 30.0–36.0)
MCV: 95.9 fL (ref 78.0–100.0)
Platelets: 300 10*3/uL (ref 150–400)
RBC: 3.63 MIL/uL — ABNORMAL LOW (ref 3.87–5.11)
RDW: 15.2 % (ref 11.5–15.5)
WBC: 12.8 10*3/uL — AB (ref 4.0–10.5)

## 2015-04-23 LAB — URINALYSIS, ROUTINE W REFLEX MICROSCOPIC
Glucose, UA: NEGATIVE mg/dL
Hgb urine dipstick: NEGATIVE
Ketones, ur: NEGATIVE mg/dL
NITRITE: NEGATIVE
PH: 6 (ref 5.0–8.0)
Protein, ur: 30 mg/dL — AB
SPECIFIC GRAVITY, URINE: 1.023 (ref 1.005–1.030)

## 2015-04-23 LAB — INFLUENZA PANEL BY PCR (TYPE A & B)
H1N1FLUPCR: NOT DETECTED
Influenza A By PCR: NEGATIVE
Influenza B By PCR: NEGATIVE

## 2015-04-23 LAB — BRAIN NATRIURETIC PEPTIDE: B Natriuretic Peptide: 2532.8 pg/mL — ABNORMAL HIGH (ref 0.0–100.0)

## 2015-04-23 LAB — MRSA PCR SCREENING: MRSA by PCR: NEGATIVE

## 2015-04-23 LAB — TROPONIN I
TROPONIN I: 0.05 ng/mL — AB (ref <0.031)
TROPONIN I: 0.04 ng/mL — AB (ref <0.031)

## 2015-04-23 MED ORDER — DEXTROSE 5 % IV SOLN
1.0000 g | INTRAVENOUS | Status: DC
  Administered 2015-04-24 – 2015-04-27 (×4): 1 g via INTRAVENOUS
  Filled 2015-04-23 (×5): qty 10

## 2015-04-23 MED ORDER — FUROSEMIDE 10 MG/ML IJ SOLN
40.0000 mg | Freq: Two times a day (BID) | INTRAMUSCULAR | Status: DC
  Administered 2015-04-23 – 2015-04-28 (×10): 40 mg via INTRAVENOUS
  Filled 2015-04-23 (×10): qty 4

## 2015-04-23 MED ORDER — SODIUM CHLORIDE 0.9 % IV SOLN
250.0000 mL | INTRAVENOUS | Status: DC | PRN
  Administered 2015-04-24: 250 mL via INTRAVENOUS

## 2015-04-23 MED ORDER — SIMVASTATIN 20 MG PO TABS
20.0000 mg | ORAL_TABLET | Freq: Every evening | ORAL | Status: DC
  Administered 2015-04-23 – 2015-04-29 (×7): 20 mg via ORAL
  Filled 2015-04-23 (×7): qty 1

## 2015-04-23 MED ORDER — LEVOTHYROXINE SODIUM 88 MCG PO TABS
88.0000 ug | ORAL_TABLET | Freq: Every day | ORAL | Status: DC
Start: 2015-04-24 — End: 2015-04-29
  Administered 2015-04-24 – 2015-04-29 (×6): 88 ug via ORAL
  Filled 2015-04-23 (×6): qty 1

## 2015-04-23 MED ORDER — ENOXAPARIN SODIUM 40 MG/0.4ML ~~LOC~~ SOLN
40.0000 mg | SUBCUTANEOUS | Status: DC
  Administered 2015-04-23 – 2015-04-28 (×6): 40 mg via SUBCUTANEOUS
  Filled 2015-04-23 (×7): qty 0.4

## 2015-04-23 MED ORDER — POLYETHYLENE GLYCOL 3350 17 GM/SCOOP PO POWD: 1.0000 | Freq: Every day | ORAL | Status: DC | PRN

## 2015-04-23 MED ORDER — ACETAMINOPHEN 325 MG PO TABS
650.0000 mg | ORAL_TABLET | ORAL | Status: DC | PRN
  Administered 2015-04-29: 650 mg via ORAL
  Filled 2015-04-23: qty 2

## 2015-04-23 MED ORDER — POTASSIUM CHLORIDE CRYS ER 20 MEQ PO TBCR
40.0000 meq | EXTENDED_RELEASE_TABLET | Freq: Once | ORAL | Status: AC
  Administered 2015-04-23: 40 meq via ORAL
  Filled 2015-04-23: qty 2

## 2015-04-23 MED ORDER — SODIUM CHLORIDE 0.9% FLUSH
3.0000 mL | INTRAVENOUS | Status: DC | PRN
  Administered 2015-04-27 (×2): 3 mL via INTRAVENOUS
  Filled 2015-04-23 (×2): qty 3

## 2015-04-23 MED ORDER — ONDANSETRON HCL 4 MG/2ML IJ SOLN: 4.0000 mg | Freq: Four times a day (QID) | INTRAMUSCULAR | Status: DC | PRN

## 2015-04-23 MED ORDER — DEXTROSE 5 % IV SOLN
1.0000 g | Freq: Once | INTRAVENOUS | Status: AC
  Administered 2015-04-23: 1 g via INTRAVENOUS
  Filled 2015-04-23: qty 10

## 2015-04-23 MED ORDER — BUPROPION HCL ER (XL) 300 MG PO TB24
300.0000 mg | ORAL_TABLET | Freq: Every day | ORAL | Status: DC
  Administered 2015-04-23 – 2015-04-29 (×7): 300 mg via ORAL
  Filled 2015-04-23 (×8): qty 1

## 2015-04-23 MED ORDER — POLYSACCHARIDE IRON COMPLEX 150 MG PO CAPS
150.0000 mg | ORAL_CAPSULE | Freq: Every day | ORAL | Status: DC
  Administered 2015-04-24 – 2015-04-29 (×6): 150 mg via ORAL
  Filled 2015-04-23 (×6): qty 1

## 2015-04-23 MED ORDER — SODIUM CHLORIDE 0.9% FLUSH
3.0000 mL | Freq: Two times a day (BID) | INTRAVENOUS | Status: DC
  Administered 2015-04-24 – 2015-04-29 (×11): 3 mL via INTRAVENOUS

## 2015-04-23 MED ORDER — FUROSEMIDE 10 MG/ML IJ SOLN
20.0000 mg | Freq: Once | INTRAMUSCULAR | Status: AC
  Administered 2015-04-23: 20 mg via INTRAVENOUS
  Filled 2015-04-23: qty 2

## 2015-04-23 MED ORDER — POTASSIUM CHLORIDE CRYS ER 20 MEQ PO TBCR
40.0000 meq | EXTENDED_RELEASE_TABLET | Freq: Every day | ORAL | Status: DC
  Administered 2015-04-24 – 2015-04-27 (×4): 40 meq via ORAL
  Filled 2015-04-23 (×5): qty 2

## 2015-04-23 MED ORDER — CALCIUM CITRATE 950 (200 CA) MG PO TABS
200.0000 mg | ORAL_TABLET | Freq: Every day | ORAL | Status: DC
  Filled 2015-04-23: qty 1

## 2015-04-23 MED ORDER — PANTOPRAZOLE SODIUM 40 MG PO TBEC
40.0000 mg | DELAYED_RELEASE_TABLET | Freq: Every day | ORAL | Status: DC
  Administered 2015-04-23 – 2015-04-29 (×7): 40 mg via ORAL
  Filled 2015-04-23 (×7): qty 1

## 2015-04-23 MED ORDER — ADULT MULTIVITAMIN W/MINERALS CH: 1.0000 | ORAL_TABLET | Freq: Every day | ORAL | Status: DC

## 2015-04-23 MED ORDER — SODIUM CHLORIDE 0.9 % IV BOLUS (SEPSIS)
500.0000 mL | Freq: Once | INTRAVENOUS | Status: AC
  Administered 2015-04-23: 500 mL via INTRAVENOUS

## 2015-04-23 NOTE — H&P (Signed)
[facility] History and Physical                                                                                    Dana [name], is a 82 y.o. female  MRN: [id number]   DOB - [birth year]  Admit Date - 04/23/2015  Outpatient Primary MD for the patient is Reginia Naas, MD  Referring MD: Lita Mains / ER  PMH: Past Medical History  Diagnosis Date  . Hypothyroidism   . Esophageal reflux   . Seasonal allergies   . Hypercholesteremia   . Depression   . Anxiety   . History of diverticulitis of colon   . History of peptic ulcer   . Chronic back pain     "gets it worse as it goes down" (02/24/2015)  . Bladder prolapse, female, acquired   . Heart murmur   . History of electroconvulsive therapy 1956    "then had insulin shock; for deep deep depression"  . History of hiatal hernia   . Migraine     "might have one maybe once/month" (02/24/2015)  . Arthritis     "in my back" (02/24/2015)  . Breast cancer, left breast ([place]) 1970  . Non Hodgkin's lymphoma ([place]) 2001  . Basal cell carcinoma of nasal tip       PSH: Past Surgical History  Procedure Laterality Date  . Lumbar laminectomy  2012  . Mastectomy, radical Left 1970  . Back surgery    . Abdominal hysterectomy      "took out my uterus only"  . Cataract extraction w/ intraocular lens  implant, bilateral    . Breast biopsy Left 1970  . Bone marrow biopsy  2001    "for non-Hodgkin's lymphoma"     CC:  Chief Complaint  Patient presents with  . Shortness of Breath     HPI: This is an 82 year old female patient discharged December 2016 after admission for HCAP and combined systolic and diastolic heart failure. Heart failure was a new finding on echocardiogram during that admission. She was evaluated by cardiology. Medical management was recommended. She has associated pulmonary hypertension and severe aortic stenosis. Because of borderline low normal blood pressure cardiology recommended not adding an ACE inhibitor  or beta blocker at time of discharge and recommended to continue monitoring with the possibility of adding ACE inhibitor or carvedilol if blood pressure could tolerate. She did receive Lasix 20 mg daily for 7 days then recommended prn dosing schedule. Patient was to follow-up with cardiology 1 week after discharge. In addition to the above problem, patient has history of hypothyroidism, chronic back pain, anxiety, frequent falls, peptic ulcer disease, prior breast cancer, and suspected dementia. Was sent to the ER today because of abrupt onset of shortness of breath this morning. Room air saturations were 88%. She has a chronic dry cough. EMS was called to her nursing facility and patient was subsequently placed on 2 L nasal cannula oxygen and was given 5 mg albuterol en route. Does have an order for when necessary oxygen typically does not utilize. Patient did have issues related to thrush after antibiotics received from previous admission but has completed therapy for this  although she is continuing to complain of a significant dry mouth. Patient is an inconsistent historian and primarily is complaining of significant dry mouth which she attributes to ongoing thrush symptoms and repeatedly requests something to drink.  ER Evaluation and treatment: Afebrile, BP 121/61, pulse 97 respirations 18, 2 saturations 99% on 2 L EKG: Sinus rhythm with ventricular rate 98 bpm, QTC 548 ms, voltage criteria met for LVH, patient has subtle downsloping in inferior lateral leads primarily V5 and V6 which is essentially unchanged from previous EKG December 2016 but no definitive ischemic changes 2 View CXR: Bilateral interstitial prominence suspicious for interstitial edema or pneumonitis with trace left pleural effusion and left basilar atelectasis Laboratory data: K+ 3.1, BNP 2533, VPC 12,800, hemoglobin 11.2, creatinine 0.76 with BUN 11, glucose 118, urinalysis markedly abnormal with cloudy appearance, many bacteria,  calcium oxalate crystals, large leukocytes, 30 of protein, specific gravity 1.023, squamous epithelials 6-30, WBC too numerous to count, urine culture is pending Normal saline bolus 500 mL IV 1 Rocephin 1 g IV 1 Lasix 20 mg IV 1  Review of Systems   In addition to the HPI above, (very difficult to obtain accurately given patient's inconsistent responses and difficulty in maintaining appropriate conversation and responses to conversation at hand/line of questioning) No Fever-chills, myalgias or other constitutional symptoms No Headache, changes with Vision or hearing, new weakness, tingling, numbness in any extremity, No problems swallowing food or Liquids, indigestion/reflux No Chest pain, palpitations, orthopnea or DOE No Abdominal pain, N/V; no melena or hematochezia, no dark tarry stools No dysuria, hematuria or flank pain No new skin rashes, lesions, masses or bruises, No new joints pains-aches No recent weight gain or loss No polyuria, polydypsia or polyphagia,  *A full 10 point Review of Systems was done, except as stated above, all other Review of Systems were negative.  Social History Social History  Substance Use Topics  . Smoking status: Former Smoker -- 1.00 packs/day for 5 years    Types: Cigarettes  . Smokeless tobacco: Never Used  . Alcohol Use: 0.6 oz/week    1 Glasses of wine per week    Resides at: Skilled nursing facility  Lives with: N/A  Ambulatory status: Rolling walker   Family History Family History  Problem Relation Age of Onset  . Diabetes Mother   . Heart disease Mother   . Heart attack Mother   . Cancer - Prostate Father      Prior to Admission medications   Medication Sig Start Date End Date Taking? Authorizing Provider  buPROPion (WELLBUTRIN XL) 150 MG 24 hr tablet Take 300 mg by mouth daily.    Yes Historical Provider, MD  calcium citrate (CALCITRATE - DOSED IN MG ELEMENTAL CALCIUM) 950 MG tablet Take 200 mg of elemental calcium by  mouth daily.   Yes Historical Provider, MD  iron polysaccharides (NIFEREX) 150 MG capsule Take 150 mg by mouth daily.   Yes Historical Provider, MD  levothyroxine (SYNTHROID) 88 MCG tablet Take 88 mcg by mouth daily before breakfast.   Yes Historical Provider, MD  nystatin (MYCOSTATIN) 100000 UNIT/ML suspension Take 5 mLs by mouth 4 (four) times daily. 10 day regimen. Filled 04/08/15. Pt. Says still has a few doses left. 04/08/15  Yes Historical Provider, MD  omeprazole (PRILOSEC) 40 MG capsule Take 40 mg by mouth daily.   Yes Historical Provider, MD  polyethylene glycol powder (MIRALAX) powder Take 1 Container by mouth daily as needed for mild constipation.    Yes Historical Provider,  MD  simvastatin (ZOCOR) 20 MG tablet Take 20 mg by mouth daily.   Yes Historical Provider, MD  cefUROXime (CEFTIN) 500 MG tablet Take 1 tablet (500 mg total) by mouth 2 (two) times daily with a meal. Patient not taking: Reported on 04/23/2015 03/04/15   Caren Griffins, MD  diazepam (VALIUM) 5 MG tablet Take 1 tablet (5 mg total) by mouth daily as needed for anxiety or muscle spasms (and nausea). Patient not taking: Reported on 04/23/2015 03/04/15   Caren Griffins, MD  furosemide (LASIX) 20 MG tablet Take 1 tablet (20 mg total) by mouth daily. Daily for 7 days then as needed for fluid buildup Patient not taking: Reported on 04/23/2015 03/04/15   Caren Griffins, MD  hydrOXYzine (ATARAX/VISTARIL) 10 MG tablet Take 1 tablet (10 mg total) by mouth every 6 (six) hours as needed for itching. Patient not taking: Reported on 02/24/2015 11/03/14   Waynetta Pean, PA-C  potassium chloride (K-DUR) 10 MEQ tablet Take 1 tablet (10 mEq total) by mouth daily. With Lasix. Discontinue if Lasix is to be discontinued Patient not taking: Reported on 04/23/2015 03/04/15   Caren Griffins, MD    Allergies  Allergen Reactions  . Amoxicillin Other (See Comments)    Excessive sweating   . Amoxicillin-Pot Clavulanate Other (See Comments)  .  Celexa [Citalopram Hydrobromide]     unknown  . Iohexol      Desc: will need premedicated   . Pollen Extract   . Prozac [Fluoxetine Hcl]     unknown  . Promethazine Other (See Comments)    Restlessness    . Shrimp [Shellfish Allergy] Itching    Physical Exam  Vitals  Blood pressure 123/64, pulse 97, temperature 97.5 F (36.4 C), temperature source Oral, resp. rate 23, height '5\' 5"'  (1.651 m), weight 152 lb (68.947 kg), SpO2 100 %.   General:  In no acute distress, appears stated age but nontoxic  Psych: Anxious affect and easily agitated, Denies Suicidal or Homicidal ideations, Awake Alert, Oriented X 3. Speech and thought patterns are clear and appropriate  Neuro:   No focal neurological deficits, CN II through XII intact, Strength 5/5 all 4 extremities, Sensation intact all 4 extremities.  ENT:  Ears and Eyes appear Normal, Conjunctivae clear, PER. Dry oral mucosa without erythema or exudates including no evidence of thrush.  Neck:  Supple, No lymphadenopathy appreciated  Respiratory:  Symmetrical chest wall movement, Good air movement bilaterally, bilateral crackles mid fields down. 2 L  Cardiac:  RRR, a 2/6 systolic murmur right sternal border, no LE edema noted, no JVD, No carotid bruits, peripheral pulses palpable at 2+  Abdomen:  Positive bowel sounds, Soft, Non tender, Non distended,  No masses appreciated, no obvious hepatosplenomegaly  Skin:  No Cyanosis, Normal Skin Turgor, No Skin Rash or Bruise.  Extremities: Symmetrical without obvious trauma or injury,  no effusions.  Data Review  CBC  Recent Labs Lab 04/23/15 1325  WBC 12.8*  HGB 11.2*  HCT 34.8*  PLT 300  MCV 95.9  MCH 30.9  MCHC 32.2  RDW 15.2    Chemistries   Recent Labs Lab 04/23/15 1325  NA 143  K 3.1*  CL 101  CO2 30  GLUCOSE 118*  BUN 11  CREATININE 0.76  CALCIUM 8.7*    estimated creatinine clearance is 50.2 mL/min (by C-G formula based on Cr of 0.76).  No results for  input(s): TSH, T4TOTAL, T3FREE, THYROIDAB in the last 72 hours.  Invalid  input(s): FREET3  Coagulation profile No results for input(s): INR, PROTIME in the last 168 hours.  No results for input(s): DDIMER in the last 72 hours.  Cardiac Enzymes No results for input(s): CKMB, TROPONINI, MYOGLOBIN in the last 168 hours.  Invalid input(s): CK  Invalid input(s): POCBNP  Urinalysis    Component Value Date/Time   COLORURINE YELLOW 04/23/2015 1350   APPEARANCEUR CLOUDY* 04/23/2015 1350   LABSPEC 1.023 04/23/2015 1350   PHURINE 6.0 04/23/2015 1350   GLUCOSEU NEGATIVE 04/23/2015 1350   HGBUR NEGATIVE 04/23/2015 1350   BILIRUBINUR SMALL* 04/23/2015 1350   KETONESUR NEGATIVE 04/23/2015 1350   PROTEINUR 30* 04/23/2015 1350   UROBILINOGEN 0.2 03/14/2014 1913   NITRITE NEGATIVE 04/23/2015 1350   LEUKOCYTESUR LARGE* 04/23/2015 1350    Imaging results:   Dg Chest 2 View  04/23/2015  CLINICAL DATA:  Worsening shortness of Breath EXAM: CHEST  2 VIEW COMPARISON:  03/04/2015 FINDINGS: Cardiomediastinal silhouette is stable. Tortuous ascending aorta with atherosclerotic calcifications are noted. Hiatal hernia again noted. Again noted bilateral interstitial prominence suspicious for interstitial edema or pneumonitis. There is trace left pleural effusion with left basilar atelectasis or infiltrate. IMPRESSION: Hiatal hernia again noted. Again noted bilateral interstitial prominence suspicious for interstitial edema or pneumonitis. There is trace left pleural effusion with left basilar atelectasis or infiltrate. Electronically Signed   By: Lahoma Crocker M.D.   On: 04/23/2015 12:57     EKG: (Independently reviewed) Sinus rhythm with ventricular rate 98 bpm, QTC 548 ms, voltage criteria met for LVH, patient has subtle downsloping in inferior lateral leads primarily V5 and V6 which is essentially unchange   Assessment & Plan  Principal Problem:   Acute respiratory failure with hypoxia: Acute combined  systolic and diastolic congestive heart failure / Severe aortic stenosis / Pulmonary hypertension  -Chest x-ray and clinical presentation consistent with heart failure exacerbation (acute hypoxemia, elevated BNP and abnormal chest x-ray)-no lower extremity edema and admission weight unknown -Admit to telemetry/ Inpt -Given 20 mg IV Lasix in ER and will continue Lasix 40 IV every 12 hours (baseline renal function normal) -Daily weights and strict I/O -Echocardiogram completed last admission with notable EF 40-45% and grade 1 diastolic dysfunction in setting of severe aortic stenosis with associated pulmonary hypertension -Medical management opted for but due to suboptimal blood pressures prior to discharge unable to initiate beta blocker or ACE inhibitor last admission-current systolic blood pressure in the 120s but with focus on diuresing initially we'll hold off on adding low-dose carvedilol but likely will need to reevaluate prior to discharge if blood pressure can tolerate -No focal infiltrates and nonproductive dry cough so doubt infectious process but as precaution we'll check influenza PCR and respiratory viral panel given pneumonitis appearance on chest x-ray -For completeness of evaluation cycle troponin  Active Problems:   Acute hypokalemia -Oral replacement -Follow labs in a.m. given utilization of Lasix    Hypothyroidism -Continue preadmission Synthroid    Acute UTI/Leukocytosis -Follow up on urine culture -Likely explains leukocytosis -Given prolonged QT avoid floraquinolones    Dry mouth -Likely related to recent thrush although could be related to other medications -Continue to monitor -Out on diuretics prior to admission and expect to get worse with diuresis this admission    QT prolongation -Slightly longer then on December EKG which was 499 ms and has increased since 2012 and 2015 EKGs -Repeat EKG in a.m. -May affect choice of CHF medical therapy (i.e. limit beta  blocker usage)   HLD -Continue Zocor  Chronic back pain  -Not on chronic medications    Anxiety/  ? Dementia -Monitor for acute delirium during hospitalization -Continue Wellbutrin    Falls frequently -Resident of skilled nursing facility -Uses rolling walker at baseline -PT evaluation     DVT Prophylaxis: Lovenox  Family Communication:   No family at bedside  Code Status:  Full code based on previous admission  Condition:  Stable  Discharge disposition: Anticipate return to skilled nursing facility once medically stable  Time spent in minutes : 60      ELLIS,ALLISON L. ANP on 04/23/2015 at 3:50 PM  You may contact me by going to [email or web address] - password [email or web address]  I am available from 7a-7p but please confirm I am on the schedule by going to Amion as above.   After 7p please contact night coverage person covering me after hours  [facility]

## 2015-04-23 NOTE — ED Notes (Signed)
Admitting at bedside 

## 2015-04-23 NOTE — ED Notes (Signed)
Pt states she had a worsening of shortness of breath this am and had to wear 2L [place] which she normally doesnt where. Pt was recently treated for pneumonia and given home o2 as needed. Per ems pt was 88% on room air. Pt was given 5mg  albuterol  And 4 mg of zofran in route. Pt states she has been taking medicine for thrush and has made her feel nausea with no vomiting. Pt is alert and ox3 on arrival. Pt has no labored respirations wheezing is noted.

## 2015-04-23 NOTE — Progress Notes (Signed)
Pt alert to self, no c/o pain, bed alarm on, pt resting comfortably, VSS, pt stable, MRSA swab negative, flu negative, respiratory panel still pending

## 2015-04-23 NOTE — ED Provider Notes (Signed)
CSN: [id number]     Arrival date & time 04/23/15  1215 History   First MD Initiated Contact with Patient 04/23/15 1237     Chief Complaint  Patient presents with  . Shortness of Breath   (Consider location/radiation/quality/duration/timing/severity/associated sxs/prior Treatment) HPI 82 y.o. female with a hx of CHF, HCAP in Dec, presents to the Emergency Department today complaining of shortness of breath this morning. States that she called EMS due to her trouble breathing/cough. No CP. No hemoptysis. Had to be placed on 2L [place] as well as '5mg'$  albuterol en route. Has O2 at home, but normally does not take it. Treated for HCAP in December. Also recently treated for Thrush last month with Nystatin. Patient convinced that the reason she is short of breath is due to the thrush coming back. No CP/ABD pain. No dysuria. No headache. No numbness/tingling. No other symptoms noted.     Past Medical History  Diagnosis Date  . Hypothyroidism   . Esophageal reflux   . Seasonal allergies   . Hypercholesteremia   . Depression   . Anxiety   . History of diverticulitis of colon   . History of peptic ulcer   . Chronic back pain     "gets it worse as it goes down" (02/24/2015)  . Bladder prolapse, female, acquired   . Heart murmur   . History of electroconvulsive therapy 1956    "then had insulin shock; for deep deep depression"  . History of hiatal hernia   . Migraine     "might have one maybe once/month" (02/24/2015)  . Arthritis     "in my back" (02/24/2015)  . Breast cancer, left breast ([place]) 1970  . Non Hodgkin's lymphoma ([place]) 2001  . Basal cell carcinoma of nasal tip    Past Surgical History  Procedure Laterality Date  . Lumbar laminectomy  2012  . Mastectomy, radical Left 1970  . Back surgery    . Abdominal hysterectomy      "took out my uterus only"  . Cataract extraction w/ intraocular lens  implant, bilateral    . Breast biopsy Left 1970  . Bone marrow biopsy  2001    "for  non-Hodgkin's lymphoma"   Family History  Problem Relation Age of Onset  . Diabetes Mother   . Heart disease Mother   . Heart attack Mother   . Cancer - Prostate Father    Social History  Substance Use Topics  . Smoking status: Former Smoker -- 1.00 packs/day for 5 years    Types: Cigarettes  . Smokeless tobacco: Never Used  . Alcohol Use: 0.6 oz/week    1 Glasses of wine per week   OB History    No data available     Review of Systems ROS reviewed and all are negative for acute change except as noted in the HPI.  Allergies  Amoxicillin; Celexa; Iohexol; Pollen extract; Prozac; Promethazine; and Shrimp  Home Medications   Prior to Admission medications   Medication Sig Start Date End Date Taking? Authorizing Provider  buPROPion (WELLBUTRIN XL) 150 MG 24 hr tablet Take 300 mg by mouth daily.     Historical Provider, MD  calcium citrate (CALCITRATE - DOSED IN MG ELEMENTAL CALCIUM) 950 MG tablet Take 200 mg of elemental calcium by mouth daily.    Historical Provider, MD  cefUROXime (CEFTIN) 500 MG tablet Take 1 tablet (500 mg total) by mouth 2 (two) times daily with a meal. 03/04/15   Costin Karlyne Greenspan, MD  diazepam (VALIUM) 5 MG tablet Take 1 tablet (5 mg total) by mouth daily as needed for anxiety or muscle spasms (and nausea). 03/04/15   Costin Karlyne Greenspan, MD  furosemide (LASIX) 20 MG tablet Take 1 tablet (20 mg total) by mouth daily. Daily for 7 days then as needed for fluid buildup 03/04/15   Caren Griffins, MD  hydrOXYzine (ATARAX/VISTARIL) 10 MG tablet Take 1 tablet (10 mg total) by mouth every 6 (six) hours as needed for itching. Patient not taking: Reported on 02/24/2015 11/03/14   Waynetta Pean, PA-C  iron polysaccharides (NIFEREX) 150 MG capsule Take 150 mg by mouth daily.    Historical Provider, MD  levothyroxine (SYNTHROID) 88 MCG tablet Take 88 mcg by mouth daily before breakfast.    Historical Provider, MD  Multiple Vitamin (MULTIVITAMIN WITH MINERALS) TABS tablet  Take 1 tablet by mouth daily.    Historical Provider, MD  omeprazole (PRILOSEC) 40 MG capsule Take 40 mg by mouth daily.    Historical Provider, MD  polyethylene glycol powder (MIRALAX) powder Take 1 Container by mouth daily as needed for mild constipation.     Historical Provider, MD  potassium chloride (K-DUR) 10 MEQ tablet Take 1 tablet (10 mEq total) by mouth daily. With Lasix. Discontinue if Lasix is to be discontinued 03/04/15   Caren Griffins, MD  simvastatin (ZOCOR) 20 MG tablet Take 20 mg by mouth daily.    Historical Provider, MD   BP 121/61 mmHg  Pulse 97  Temp(Src) 97.5 F (36.4 C) (Oral)  Resp 18  Ht _0  (1.651 m)  Wt 68.947 kg  BMI 25.29 kg/m2  SpO2 99%   Physical Exam  Constitutional: She is oriented to person, place, and time. She appears well-developed and well-nourished.  HENT:  Head: Normocephalic and atraumatic.  Eyes: EOM are normal. Pupils are equal, round, and reactive to light.  Neck: Normal range of motion. Neck supple.  Cardiovascular: Normal rate, regular rhythm and normal heart sounds.   Pulmonary/Chest: Effort normal. She has wheezes in the right upper field, the right lower field, the left upper field and the left lower field. She has rhonchi in the right lower field and the left lower field.  Abdominal: Soft.  Musculoskeletal: Normal range of motion.  Neurological: She is alert and oriented to person, place, and time.  Skin: Skin is warm and dry.  Psychiatric: She has a normal mood and affect. Her behavior is normal. Thought content normal.  Nursing note and vitals reviewed.   ED Course  Procedures (including critical care time) Labs Review Labs Reviewed  CBC - Abnormal; Notable for the following:    WBC 12.8 (*)    RBC 3.63 (*)    Hemoglobin 11.2 (*)    HCT 34.8 (*)    All other components within normal limits  BASIC METABOLIC PANEL - Abnormal; Notable for the following:    Potassium 3.1 (*)    Glucose, Bld 118 (*)    Calcium 8.7 (*)     All other components within normal limits  URINALYSIS, ROUTINE W REFLEX MICROSCOPIC (NOT AT [facility]) - Abnormal; Notable for the following:    APPearance CLOUDY (*)    Bilirubin Urine SMALL (*)    Protein, ur 30 (*)    Leukocytes, UA LARGE (*)    All other components within normal limits  BRAIN NATRIURETIC PEPTIDE - Abnormal; Notable for the following:    B Natriuretic Peptide 2532.8 (*)    All other components within normal  limits  URINE MICROSCOPIC-ADD ON - Abnormal; Notable for the following:    Squamous Epithelial / LPF 6-30 (*)    Bacteria, UA MANY (*)    Crystals CA OXALATE CRYSTALS (*)    All other components within normal limits  URINE CULTURE    Imaging Review Dg Chest 2 View  04/23/2015  CLINICAL DATA:  Worsening shortness of Breath EXAM: CHEST  2 VIEW COMPARISON:  03/04/2015 FINDINGS: Cardiomediastinal silhouette is stable. Tortuous ascending aorta with atherosclerotic calcifications are noted. Hiatal hernia again noted. Again noted bilateral interstitial prominence suspicious for interstitial edema or pneumonitis. There is trace left pleural effusion with left basilar atelectasis or infiltrate. IMPRESSION: Hiatal hernia again noted. Again noted bilateral interstitial prominence suspicious for interstitial edema or pneumonitis. There is trace left pleural effusion with left basilar atelectasis or infiltrate. Electronically Signed   By: Lahoma Crocker M.D.   On: 04/23/2015 12:57   I have personally reviewed and evaluated these images and lab results as part of my medical decision-making.   EKG Interpretation None      MDM  I have reviewed relevant laboratory values. I have reviewed relevant imaging studies. I personally interpreted the relevant EKG. I have reviewed the relevant previous healthcare records. I have reviewed EMS Documentation. I obtained HPI from historian. Patient discussed with supervising physician  ED Course:  Assessment: 87yF with hx CHF and HCAP in Dec  presents with SOB this morning. Brought in by EMS with O2 sat at 88%. Given 2L [place] as well as neb treatment which brought her to 99%. CXR showed interstitial edema. BNP ~2500. UA showed UTI. Given Lasix 55m as well as Rocephin 1g IV. Previous TTE on 03/10/15 showed EF 40-45%. No BLE edema. Will admit to medicine for acute CHF exacerbation.  Patient is in no acute distress. Vital Signs are stable.    Disposition/Plan:  Admit Pt acknowledges and agrees with plan   Supervising Physician DJulianne Rice MD   Final diagnoses:  Acute congestive heart failure, unspecified congestive heart failure type ([facility]  UTI (lower urinary tract infection)        TShary Decamp PA-C 04/23/15 1514  DJulianne Rice MD 04/23/15 1540

## 2015-04-24 ENCOUNTER — Inpatient Hospital Stay (HOSPITAL_COMMUNITY): Payer: Medicare Other

## 2015-04-24 DIAGNOSIS — E876 Hypokalemia: Secondary | ICD-10-CM

## 2015-04-24 DIAGNOSIS — N390 Urinary tract infection, site not specified: Secondary | ICD-10-CM

## 2015-04-24 DIAGNOSIS — I350 Nonrheumatic aortic (valve) stenosis: Secondary | ICD-10-CM

## 2015-04-24 DIAGNOSIS — I5041 Acute combined systolic (congestive) and diastolic (congestive) heart failure: Principal | ICD-10-CM

## 2015-04-24 LAB — BRAIN NATRIURETIC PEPTIDE: B NATRIURETIC PEPTIDE 5: 2132.5 pg/mL — AB (ref 0.0–100.0)

## 2015-04-24 LAB — BASIC METABOLIC PANEL
Anion gap: 8 (ref 5–15)
BUN: 10 mg/dL (ref 6–20)
CALCIUM: 8.2 mg/dL — AB (ref 8.9–10.3)
CO2: 34 mmol/L — ABNORMAL HIGH (ref 22–32)
CREATININE: 0.79 mg/dL (ref 0.44–1.00)
Chloride: 102 mmol/L (ref 101–111)
Glucose, Bld: 104 mg/dL — ABNORMAL HIGH (ref 65–99)
Potassium: 3.8 mmol/L (ref 3.5–5.1)
SODIUM: 144 mmol/L (ref 135–145)

## 2015-04-24 LAB — TROPONIN I: TROPONIN I: 0.04 ng/mL — AB (ref <0.031)

## 2015-04-24 MED ORDER — CALCIUM CARBONATE 1250 (500 CA) MG PO TABS
1.0000 | ORAL_TABLET | Freq: Every day | ORAL | Status: DC
  Administered 2015-04-24 – 2015-04-29 (×6): 500 mg via ORAL
  Filled 2015-04-24 (×6): qty 1

## 2015-04-24 NOTE — Care Management Note (Signed)
Case Management Note  Patient Details  Name: Dana [name] MRN: [id number] Date of Birth: 09-04-1927  Subjective/Objective:                    Action/Plan: Patient was admitted with Acute respiratory failure with hypoxia: Due to Acute combined systolic and diastolic congestive heart failure / Severe aortic stenosis / Pulmonary hypertension.  Patient was admitted from [facility]. Will follow for discharge needs.  Expected Discharge Date:                  Expected Discharge Plan:  Skilled Nursing Facility  In-House Referral:  Clinical Social Work  Discharge planning Services     Post Acute Care Choice:    Choice offered to:     DME Arranged:    DME Agency:     HH Arranged:    [place] Agency:     Status of Service:  In process, will continue to follow  Medicare Important Message Given:    Date Medicare IM Given:    Medicare IM give by:    Date Additional Medicare IM Given:    Additional Medicare Important Message give by:     If discussed at [place] of Stay Meetings, dates discussed:    Additional CommentsRolm Baptise, RN 04/24/2015, 2:38 PM [phone]

## 2015-04-24 NOTE — Progress Notes (Signed)
[facility]                                                                              Patient Demographics  Dana [name], is a 82 y.o. female, DOB - 11/27/1927, [id number]  Admit date - 04/23/2015   Admitting Physician Erline Hau, MD  Outpatient Primary MD for the patient is Reginia Naas, MD  LOS - 1   Chief Complaint  Patient presents with  . Shortness of Breath       Brief HPI   This is an 82 year old female patient discharged December 2016 after admission for HCAP and combined systolic and diastolic heart failure. Heart failure was a new finding on echocardiogram during that admission. She was evaluated by cardiology. Medical management was recommended.  Patient also has pulmonary hypertension, severe aortic stenosis, was sent to ER due to abrupt onset of dyspnea, O2 sats were 88% with dry cough.   Because of borderline low normal blood pressure cardiology recommended not adding an ACE inhibitor or beta blocker at time of previous discharge and recommended to continue monitoring with the possibility of adding ACE inhibitor or carvedilol if blood pressure could tolerate. She did receive Lasix 20 mg daily for 7 days then recommended prn dosing schedule. Patient was to follow-up with cardiology 1 week after discharge.   Assessment & Plan    Acute respiratory failure with hypoxia: Due to Acute combined systolic and diastolic congestive heart failure / Severe aortic stenosis / Pulmonary hypertension  -Chest x-ray and clinical presentation consistent with heart failure exacerbation (acute hypoxemia, elevated BNP and abnormal chest x-ray)-no lower extremity edema and admission weight unknown -Continue IV diuresis, daily weights, strict I's and O's  - Cardiology consult called.  -Echo 02/2015 EF 40-45% and grade 1 diastolic dysfunction in setting of severe aortic stenosis with associated pulmonary hypertension -Chest x-ray showed no  infiltrates. Influenza panel negative.   Active Problems:  Acute hypokalemia -Resolved   Hypothyroidism -Continue preadmission Synthroid  Urinary tract infection -Follow urine cultures, continue IV Rocephin -Given prolonged QT avoid floraquinolones   Dry mouth -Likely related to recent thrush although could be related to other medications   QT prolongation -Slightly longer then on December EKG which was 499 ms and has increased since 2012 and 2015 EKGs  HLD -Continue Zocor   Chronic back pain  -Not on chronic medications   Anxiety/ ? Dementia -Monitor for acute delirium during hospitalization -Continue Wellbutrin   Falls frequently -Resident of skilled nursing facility -Uses rolling walker at baseline -PT evaluation  Code Status: Full code  Family Communication: Discussed in detail with the patient, all imaging results, lab results explained to the patient   Disposition Plan: Will need skilled nursing facility  Time Spent in minutes   25 minutes  Procedures  None   Consults   cardiology  DVT Prophylaxis  Lovenox   Medications  Scheduled Meds: . buPROPion  300 mg Oral Daily  . calcium carbonate  1 tablet Oral Q breakfast  . cefTRIAXone (ROCEPHIN)  IV  1 g Intravenous Q24H  . enoxaparin (LOVENOX) injection  40 mg Subcutaneous Q24H  . furosemide  40 mg Intravenous Q12H  . iron polysaccharides  150 mg Oral Daily  . levothyroxine  88 mcg Oral QAC breakfast  . pantoprazole  40 mg Oral Daily  . potassium chloride  40 mEq Oral Daily  . simvastatin  20 mg Oral QPM  . sodium chloride flush  3 mL Intravenous Q12H   Continuous Infusions:  PRN Meds:.sodium chloride, acetaminophen, ondansetron (ZOFRAN) IV, polyethylene glycol powder, sodium chloride flush   Antibiotics   Anti-infectives    Start     Dose/Rate Route Frequency Ordered Stop   04/24/15 1500  cefTRIAXone (ROCEPHIN) 1 g in dextrose 5 % 50 mL IVPB     1 g 100 mL/hr over 30 Minutes  Intravenous Every 24 hours 04/23/15 1750     04/23/15 1500  cefTRIAXone (ROCEPHIN) 1 g in dextrose 5 % 50 mL IVPB     1 g 100 mL/hr over 30 Minutes Intravenous  Once 04/23/15 1454 04/23/15 1537        Subjective:   [name] was seen and examined today.  Improving feeling somewhat better today. Patient denies dizziness, chest pain, shortness of breath, abdominal pain, N/V/D/C, new weakness, numbess, tingling. No acute events overnight.    Objective:   Blood pressure 98/48, pulse 89, temperature 97.9 F (36.6 C), temperature source Oral, resp. rate 20, height 5\' 5"  (1.651 m), weight 64.139 kg (141 lb 6.4 oz), SpO2 90 %.  Wt Readings from Last 3 Encounters:  04/24/15 64.139 kg (141 lb 6.4 oz)  03/04/15 69.128 kg (152 lb 6.4 oz)     Intake/Output Summary (Last 24 hours) at 04/24/15 1221 Last data filed at 04/24/15 1100  Gross per 24 hour  Intake    480 ml  Output   2350 ml  Net  -1870 ml    Exam  General: Alert and oriented x 2, NAD  HEENT:  PERRLA, EOMI, Anicteric Sclera, mucous membranes moist.   Neck: Supple, no JVD, no masses  CVS: S1 S2 auscultated, no rubs, 2/6SEM  Respiratory: Bibasilar crackles  Abdomen: Soft, nontender, nondistended, + bowel sounds  Ext: no cyanosis clubbing or edema  Neuro: AAOx2, Cr N's II- XII. Strength 5/5 upper and lower extremities bilaterally  Skin: No rashes  Psych: Normal affect and demeanor, alert and oriented x 2   Data Review   Micro Results Recent Results (from the past 240 hour(s))  MRSA PCR Screening     Status: None   Collection Time: 04/23/15  6:40 PM  Result Value Ref Range Status   MRSA by PCR NEGATIVE NEGATIVE Final    Comment:        The GeneXpert MRSA Assay (FDA approved for NASAL specimens only), is one component of a comprehensive MRSA colonization surveillance program. It is not intended to diagnose MRSA infection nor to guide or monitor treatment for MRSA infections.     Radiology  Reports Dg Chest 2 View  04/23/2015  CLINICAL DATA:  Worsening shortness of Breath EXAM: CHEST  2 VIEW COMPARISON:  03/04/2015 FINDINGS: Cardiomediastinal silhouette is stable. Tortuous ascending aorta with atherosclerotic calcifications are noted. Hiatal hernia again noted. Again noted bilateral interstitial prominence suspicious for interstitial edema or pneumonitis. There is trace left pleural effusion with left basilar atelectasis or infiltrate. IMPRESSION: Hiatal hernia again noted. Again noted bilateral interstitial prominence suspicious for interstitial edema or pneumonitis. There is trace left pleural effusion with left basilar atelectasis or infiltrate. Electronically Signed   By: Lahoma Crocker M.D.   On: 04/23/2015 12:57  Dg Chest Port 1 View  04/24/2015  CLINICAL DATA:  CHF EXAM: PORTABLE CHEST 1 VIEW COMPARISON:  04/23/2015 FINDINGS: Cardiac shadow is stable. Aortic calcifications are again identified. Changes consistent with congestive failure are again noted. No focal confluent infiltrate is seen. No sizable effusion is noted. No bony abnormality is seen. IMPRESSION: Stable changes consistent with CHF. Electronically Signed   By: Inez Catalina M.D.   On: 04/24/2015 07:40    CBC  Recent Labs Lab 04/23/15 1325  WBC 12.8*  HGB 11.2*  HCT 34.8*  PLT 300  MCV 95.9  MCH 30.9  MCHC 32.2  RDW 15.2    Chemistries   Recent Labs Lab 04/23/15 1325 04/24/15 0357  NA 143 144  K 3.1* 3.8  CL 101 102  CO2 30 34*  GLUCOSE 118* 104*  BUN 11 10  CREATININE 0.76 0.79  CALCIUM 8.7* 8.2*   ------------------------------------------------------------------------------------------------------------------ estimated creatinine clearance is 46.3 mL/min (by C-G formula based on Cr of 0.79). ------------------------------------------------------------------------------------------------------------------ No results for input(s): HGBA1C in the last 72  hours. ------------------------------------------------------------------------------------------------------------------ No results for input(s): CHOL, HDL, LDLCALC, TRIG, CHOLHDL, LDLDIRECT in the last 72 hours. ------------------------------------------------------------------------------------------------------------------ No results for input(s): TSH, T4TOTAL, T3FREE, THYROIDAB in the last 72 hours.  Invalid input(s): FREET3 ------------------------------------------------------------------------------------------------------------------ No results for input(s): VITAMINB12, FOLATE, FERRITIN, TIBC, IRON, RETICCTPCT in the last 72 hours.  Coagulation profile No results for input(s): INR, PROTIME in the last 168 hours.  No results for input(s): DDIMER in the last 72 hours.  Cardiac Enzymes  Recent Labs Lab 04/23/15 1708 04/23/15 2207 04/24/15 0357  TROPONINI 0.05* 0.04* 0.04*   ------------------------------------------------------------------------------------------------------------------ Invalid input(s): POCBNP  No results for input(s): GLUCAP in the last 72 hours.   Erryn Dickison M.D. [facility] 04/24/2015, 12:21 PM  Pager: [phone] Between 7am to 7pm - call Pager - [phone]  After 7pm go to [email or web address] - password [email or web address]  Call night coverage person covering after 7pm

## 2015-04-24 NOTE — Evaluation (Signed)
Physical Therapy Evaluation Patient Details Name: Dana [name] MRN: [id number] DOB: 27-Apr-1927 Today's Date: 04/24/2015   History of Present Illness  Dana [name] is a 82 y.o. female with a Past Medical History of hyperthyroidism, GERD, depression, anxiety, sensory hallucinations, non-Hodgkin's lymphoma and breast cancer in remission since 2011 who presents with acute respiratory failure.   Clinical Impression  Pt was seen for evaluation of her functional level, on droplet precautions.  Her plan is to get to SNF so she can return to [place], but pt may not fully understand the nature of her instructions and plans.  Her perception is that the ALF can care for her, although she needs hands on help for all mobility    Follow Up Recommendations SNF    Equipment Recommendations  None recommended by PT (await SNF disposition)    Recommendations for Other Services Rehab consult     Precautions / Restrictions Precautions Precautions: Fall (telemetry) Restrictions Weight Bearing Restrictions: No      Mobility  Bed Mobility Overal bed mobility: Needs Assistance Bed Mobility: Supine to Sit     Supine to sit: Min assist;Mod assist     General bed mobility comments: needs help to slide on bed out to edge, cannot slide back on surface either  Transfers Overall transfer level: Needs assistance Equipment used: Rolling walker (2 wheeled);1 person hand held assist Transfers: Sit to/from [facility] Sit to Stand: Min assist Stand pivot transfers: Min assist       General transfer comment: help for O2 line which pt describes as source of fall   Ambulation/Gait Ambulation/Gait assistance: Min assist (assistance for monitoring O2 line) Ambulation Distance (Feet): 20 Feet Assistive device: Rolling walker (2 wheeled);1 person hand held assist Gait Pattern/deviations: Step-through pattern;Drifts right/left;Wide base of support Gait velocity: reduced Gait velocity  interpretation: Below normal speed for age/gender General Gait Details: Pt tries to lift walker over an obstacle instead of using a logical avoidance of the impediment.  Did not attend to PT instructions to leave on the floor  Stairs            Wheelchair Mobility    Modified Rankin (Stroke Patients Only)       Balance Overall balance assessment: Needs assistance Sitting-balance support: Feet supported Sitting balance-Leahy Scale: Fair       Standing balance-Leahy Scale: Poor                               Pertinent Vitals/Pain Pain Assessment: No/denies pain    Home Living Family/patient expects to be discharged to:: Assisted living               Home Equipment: Gilford Rile - 4 wheels Additional Comments: [place] resident of [employer]    Prior Function Level of Independence: Needs assistance   Gait / Transfers Assistance Needed: used 4 wheeled walker, admits she has fallen     Comments: was getting help with cleaning and meals     Hand Dominance        Extremity/Trunk Assessment   Upper Extremity Assessment: Generalized weakness           Lower Extremity Assessment: Generalized weakness      Cervical / Trunk Assessment: Kyphotic  Communication   Communication: HOH  Cognition Arousal/Alertness: Awake/alert Behavior During Therapy: WFL for tasks assessed/performed Overall Cognitive Status: Within Functional Limits for tasks assessed  General Comments General comments (skin integrity, edema, etc.): Pt was able to walk with help but her O2 sats declined to 91% on 2L O2.  Resting values were 96%.      Exercises        Assessment/Plan    PT Assessment Patient needs continued PT services  PT Diagnosis Generalized weakness;Difficulty walking   PT Problem List Decreased strength;Decreased range of motion;Decreased activity tolerance;Decreased balance;Decreased mobility;Decreased coordination;Decreased  cognition;Decreased knowledge of use of DME;Decreased safety awareness;Decreased knowledge of precautions;Cardiopulmonary status limiting activity;Decreased skin integrity  PT Treatment Interventions DME instruction;Gait training;Functional mobility training;Therapeutic activities;Therapeutic exercise;Balance training;Neuromuscular re-education;Patient/family education   PT Goals (Current goals can be found in the Care Plan section) Acute Rehab PT Goals Patient Stated Goal: to feel better,  PT Goal Formulation: With patient Time For Goal Achievement: 05/08/15 Potential to Achieve Goals: Good    Frequency Min 2X/week   Barriers to discharge Decreased caregiver support ([place] situation) Will need 24/7 help    Co-evaluation               End of Session Equipment Utilized During Treatment: Gait belt;Oxygen Activity Tolerance: Patient tolerated treatment well;Patient limited by fatigue Patient left: in chair;with call bell/phone within reach;with nursing/sitter in room Nurse Communication: Mobility status         Time: [id number] PT Time Calculation (min) (ACUTE ONLY): 26 min   Charges:   PT Evaluation $PT Eval Moderate Complexity: 1 Procedure PT Treatments $Gait Training: 8-22 mins   PT G Codes:        Ramond Dial April 26, 2015, 12:21 PM   Mee Hives, PT MS Acute Rehab Dept. Number: [phone] [id number] and [place] [phone]

## 2015-04-24 NOTE — Consult Note (Signed)
Reason for Consult:   CHF, AS  Requesting Physician: Dr Tana Coast Primary Cardiologist Dr Irish Lack  HPI:   82 y/o female, resident of [facility]. She was seen in 2012 for chest pain and had a Myoview then that showed scar involving the inferior and inferoseptal walls. Left ventricular dysfunction with and EF of 39%. She was noted to have ? Mild dementia then and medical therapy was recommended.  She was admitted in Dec 2016 with CHF. An echo then revealed severe AS with an EF of 40-45%. This was discussed with Dr Irish Lack who recommended medical Rx. She presented 04/23/15 with dyspnea and was found to be in acute CHF. BNP 2532, CXR suggestive of pulmonary edema. She is improved after diuresis (1.6L)-6 lbs. When I spoke with the pt this afternoon she was alert and oriented. She knew why she was in the hospital and she knew she had an "aortic murmur". The RN taking care of the pt says the pt is at times not appropriate- overly anxious and paranoid. I asked the pt if she had family that helps her with her medical decisions. She said she has 4 sons and a daughter though she said she makes all her own decisions about her care.   PMHx:  Past Medical History  Diagnosis Date  . Hypothyroidism   . Esophageal reflux   . Seasonal allergies   . Hypercholesteremia   . Depression   . Anxiety   . History of diverticulitis of colon   . History of peptic ulcer   . Chronic back pain     "gets it worse as it goes down" (02/24/2015)  . Bladder prolapse, female, acquired   . Heart murmur   . History of electroconvulsive therapy 1956    "then had insulin shock; for deep deep depression"  . History of hiatal hernia   . Migraine     "might have one maybe once/month" (02/24/2015)  . Arthritis     "in my back" (02/24/2015)  . Breast cancer, left breast ([place]) 1970  . Non Hodgkin's lymphoma ([place]) 2001  . Basal cell carcinoma of nasal tip     Past Surgical History  Procedure Laterality Date    . Lumbar laminectomy  2012  . Mastectomy, radical Left 1970  . Back surgery    . Abdominal hysterectomy      "took out my uterus only"  . Cataract extraction w/ intraocular lens  implant, bilateral    . Breast biopsy Left 1970  . Bone marrow biopsy  2001    "for non-Hodgkin's lymphoma"    SOCHx:  reports that she has quit smoking. Her smoking use included Cigarettes. She has a 5 pack-year smoking history. She has never used smokeless tobacco. She reports that she drinks about 0.6 oz of alcohol per week. She reports that she does not use illicit drugs.  FAMHx: Family History  Problem Relation Age of Onset  . Diabetes Mother   . Heart disease Mother   . Heart attack Mother   . Cancer - Prostate Father     ALLERGIES: Allergies  Allergen Reactions  . Amoxicillin Other (See Comments)    Excessive sweating   . Amoxicillin-Pot Clavulanate Other (See Comments)  . Celexa [Citalopram Hydrobromide]     unknown  . Iohexol      Desc: will need premedicated   . Pollen Extract   . Prozac [Fluoxetine Hcl]     unknown  . Promethazine  Other (See Comments)    Restlessness    . Shrimp [Shellfish Allergy] Itching    ROS: Review of Systems: General: negative for chills, fever, night sweats or weight changes.  Cardiovascular: negative for chest pain, dyspnea on exertion, edema, orthopnea, palpitations, paroxysmal nocturnal dyspnea or shortness of breath HEENT: negative for any visual disturbances, blindness, glaucoma Dermatological: negative for rash Respiratory: negative for cough, hemoptysis, or wheezing Urologic: negative for hematuria or dysuria Abdominal: negative for nausea, vomiting, diarrhea, bright red blood per rectum, melena, or hematemesis Neurologic: negative for visual changes, syncope, or dizziness Musculoskeletal: negative for back pain, joint pain, or swelling Psych: cooperative and appropriate All other systems reviewed and are otherwise negative except as noted  above.   HOME MEDICATIONS: Prior to Admission medications   Medication Sig Start Date End Date Taking? Authorizing Provider  buPROPion (WELLBUTRIN XL) 150 MG 24 hr tablet Take 300 mg by mouth daily.    Yes Historical Provider, MD  calcium citrate (CALCITRATE - DOSED IN MG ELEMENTAL CALCIUM) 950 MG tablet Take 200 mg of elemental calcium by mouth daily.   Yes Historical Provider, MD  iron polysaccharides (NIFEREX) 150 MG capsule Take 150 mg by mouth daily.   Yes Historical Provider, MD  levothyroxine (SYNTHROID) 88 MCG tablet Take 88 mcg by mouth daily before breakfast.   Yes Historical Provider, MD  nystatin (MYCOSTATIN) 100000 UNIT/ML suspension Take 5 mLs by mouth 4 (four) times daily. 10 day regimen. Filled 04/08/15. Pt. Says still has a few doses left. 04/08/15  Yes Historical Provider, MD  omeprazole (PRILOSEC) 40 MG capsule Take 40 mg by mouth daily.   Yes Historical Provider, MD  polyethylene glycol powder (MIRALAX) powder Take 1 Container by mouth daily as needed for mild constipation.    Yes Historical Provider, MD  simvastatin (ZOCOR) 20 MG tablet Take 20 mg by mouth daily.   Yes Historical Provider, MD  cefUROXime (CEFTIN) 500 MG tablet Take 1 tablet (500 mg total) by mouth 2 (two) times daily with a meal. Patient not taking: Reported on 04/23/2015 03/04/15   Caren Griffins, MD  diazepam (VALIUM) 5 MG tablet Take 1 tablet (5 mg total) by mouth daily as needed for anxiety or muscle spasms (and nausea). Patient not taking: Reported on 04/23/2015 03/04/15   Caren Griffins, MD  furosemide (LASIX) 20 MG tablet Take 1 tablet (20 mg total) by mouth daily. Daily for 7 days then as needed for fluid buildup Patient not taking: Reported on 04/23/2015 03/04/15   Caren Griffins, MD  hydrOXYzine (ATARAX/VISTARIL) 10 MG tablet Take 1 tablet (10 mg total) by mouth every 6 (six) hours as needed for itching. Patient not taking: Reported on 02/24/2015 11/03/14   Waynetta Pean, PA-C  potassium chloride  (K-DUR) 10 MEQ tablet Take 1 tablet (10 mEq total) by mouth daily. With Lasix. Discontinue if Lasix is to be discontinued Patient not taking: Reported on 04/23/2015 03/04/15   Caren Griffins, MD    HOSPITAL MEDICATIONS: I have reviewed the patient's current medications.  VITALS: Blood pressure 98/48, pulse 89, temperature 97.9 F (36.6 C), temperature source Oral, resp. rate 20, height '5\' 5"'  (1.651 m), weight 141 lb 6.4 oz (64.139 kg), SpO2 90 %.  PHYSICAL EXAM: General appearance: alert, cooperative, appears stated age and no distress Neck: no JVD Lungs: basilar crackles, s/p Lt radical mastectomy Heart: regular rate and rhythm and decreased heart sounds Abdomen: non tender Extremities: no edema Pulses: diminnished distal pulses Skin: cool, pale, dry Neurologic:  Grossly normal  LABS: Results for orders placed or performed during the hospital encounter of 04/23/15 (from the past 24 hour(s))  Troponin I (q 6hr x 3)     Status: Abnormal   Collection Time: 04/23/15  5:08 PM  Result Value Ref Range   Troponin I 0.05 (H) <0.031 ng/mL  Influenza panel by PCR (type A & B, H1N1)     Status: None   Collection Time: 04/23/15  6:40 PM  Result Value Ref Range   Influenza A By PCR NEGATIVE NEGATIVE   Influenza B By PCR NEGATIVE NEGATIVE   H1N1 flu by pcr NOT DETECTED NOT DETECTED  MRSA PCR Screening     Status: None   Collection Time: 04/23/15  6:40 PM  Result Value Ref Range   MRSA by PCR NEGATIVE NEGATIVE  Troponin I (q 6hr x 3)     Status: Abnormal   Collection Time: 04/23/15 10:07 PM  Result Value Ref Range   Troponin I 0.04 (H) <0.031 ng/mL  Troponin I (q 6hr x 3)     Status: Abnormal   Collection Time: 04/24/15  3:57 AM  Result Value Ref Range   Troponin I 0.04 (H) <0.031 ng/mL  Basic metabolic panel     Status: Abnormal   Collection Time: 04/24/15  3:57 AM  Result Value Ref Range   Sodium 144 135 - 145 mmol/L   Potassium 3.8 3.5 - 5.1 mmol/L   Chloride 102 101 - 111  mmol/L   CO2 34 (H) 22 - 32 mmol/L   Glucose, Bld 104 (H) 65 - 99 mg/dL   BUN 10 6 - 20 mg/dL   Creatinine, Ser 0.79 0.44 - 1.00 mg/dL   Calcium 8.2 (L) 8.9 - 10.3 mg/dL   GFR calc non Af Amer >60 >60 mL/min   GFR calc Af Amer >60 >60 mL/min   Anion gap 8 5 - 15  Brain natriuretic peptide     Status: Abnormal   Collection Time: 04/24/15  3:57 AM  Result Value Ref Range   B Natriuretic Peptide 2132.5 (H) 0.0 - 100.0 pg/mL    EKG: NSR, LVH, incomplete LBBB, QTc 548  IMAGING: Dg Chest 2 View  04/23/2015 IMPRESSION: Hiatal hernia again noted. Again noted bilateral interstitial prominence suspicious for interstitial edema or pneumonitis. There is trace left pleural effusion with left basilar atelectasis or infiltrate. Electronically Signed   By: Lahoma Crocker M.D.     Echo:  02/28/15 Study Conclusions  - Left ventricle: The cavity size was normal. Wall thickness was normal. Systolic function was mildly to moderately reduced. The estimated ejection fraction was in the range of 40% to 45%. Diffuse hypokinesis. Doppler parameters are consistent with abnormal left ventricular relaxation (grade 1 diastolic dysfunction). - Aortic valve: Moderately calcified annulus. Trileaflet; severely calcified leaflets. Cusp separation was reduced. There was severe stenosis. There was moderate regurgitation. Mean gradient (S): 39 mm Hg. Peak gradient (S): 72 mm Hg. VTI ratio of LVOT to aortic valve: 0.16. Valve area (VTI): 0.37 cm^2. Valve area (Vmax): 0.43 cm^2. - Mitral valve: Calcified annulus. There was trivial regurgitation. - Left atrium: The atrium was at the upper limits of normal in size. - Right atrium: Central venous pressure (est): 3 mm Hg. - Atrial septum: No defect or patent foramen ovale was identified. - Tricuspid valve: There was mild regurgitation. - Pulmonary arteries: PA peak pressure: 43 mm Hg (S). - Pericardium, extracardiac: There was no pericardial  effusion.  Impressions:  - Normal LV  wall thickness with LVEF 40-45% and grade 1 diastolic dysfunction. Upper normal left atrial chamber size. Moderate MAC with trivial mitral regurgitation. Severe calcific aortic stenosis as outlined above with moderate aortic regurgitation. Mild tricuspid regurgitation with PASP 43 mmHg.     IMPRESSION: Principal Problem:   Acute respiratory failure with hypoxia (HCC) Active Problems:   Severe aortic stenosis   Acute combined systolic and diastolic CHF, NYHA class 1 (HCC)   Anxiety   Falls frequently   ? Dementia   Hypothyroidism   Chronic back pain   History of breast cancer   Acute hypokalemia   Leukocytosis   QT prolongation   RECOMMENDATION: Will review with MD. ? If she is a TAVR candidate.   Time Spent Directly with Patient: 40 minutes  Kerin Ransom, [place] beeper 04/24/2015, 2:07 PM   I have personally seen and examined this patient with Kerin Ransom, PA-C. I agree with the assessment and plan as outlined above. She is admitted with CHF. She has severe aortic valve stenosis. I have spent 30 minutes in the room today and it is clear that she has at least moderate dementia. She has moments of clarity and can recall past events but she then cannot recall simple facts. She does not allow me to speak. She perseverates over issues with her tongue and possible thrush. She is not willing to discuss her aortic valve. At this time, I do not think she is a candidate for AVR or TAVR given her advanced dementia. She is not functional at baseline. I do not think we could offer her much in the way of symptomatic improvement with TAVR. Would continue to keep her euvolemic with Lasix. She could follow up with Dr. Irish Lack who has seen her in the past. No other changes at this time.   MCALHANY,CHRISTOPHER 04/24/2015 3:43 PM

## 2015-04-25 DIAGNOSIS — J9601 Acute respiratory failure with hypoxia: Secondary | ICD-10-CM

## 2015-04-25 DIAGNOSIS — G8929 Other chronic pain: Secondary | ICD-10-CM

## 2015-04-25 DIAGNOSIS — M549 Dorsalgia, unspecified: Secondary | ICD-10-CM

## 2015-04-25 DIAGNOSIS — F419 Anxiety disorder, unspecified: Secondary | ICD-10-CM

## 2015-04-25 LAB — BASIC METABOLIC PANEL
Anion gap: 12 (ref 5–15)
BUN: 16 mg/dL (ref 6–20)
CHLORIDE: 99 mmol/L — AB (ref 101–111)
CO2: 30 mmol/L (ref 22–32)
CREATININE: 0.88 mg/dL (ref 0.44–1.00)
Calcium: 8.7 mg/dL — ABNORMAL LOW (ref 8.9–10.3)
GFR calc non Af Amer: 58 mL/min — ABNORMAL LOW (ref >60)
Glucose, Bld: 136 mg/dL — ABNORMAL HIGH (ref 65–99)
POTASSIUM: 4.5 mmol/L (ref 3.5–5.1)
SODIUM: 141 mmol/L (ref 135–145)

## 2015-04-25 LAB — RESPIRATORY VIRUS PANEL
Adenovirus: NEGATIVE
Influenza A: NEGATIVE
Influenza B: NEGATIVE
METAPNEUMOVIRUS: NEGATIVE
PARAINFLUENZA 3 A: NEGATIVE
Parainfluenza 1: NEGATIVE
Parainfluenza 2: NEGATIVE
RESPIRATORY SYNCYTIAL VIRUS A: NEGATIVE
Respiratory Syncytial Virus B: NEGATIVE
Rhinovirus: NEGATIVE

## 2015-04-25 NOTE — Progress Notes (Signed)
[facility]                                                                              Patient Demographics  Dana [name], is a 82 y.o. female, DOB - [birth year], [id number]  Admit date - 04/23/2015   Admitting Physician Erline Hau, MD  Outpatient Primary MD for the patient is Reginia Naas, MD  LOS - 2   Chief Complaint  Patient presents with  . Shortness of Breath       Brief HPI   This is an 82 year old female patient discharged December 2016 after admission for HCAP and combined systolic and diastolic heart failure. Heart failure was a new finding on echocardiogram during that admission. She was evaluated by cardiology. Medical management was recommended.  Patient also has pulmonary hypertension, severe aortic stenosis, was sent to ER due to abrupt onset of dyspnea, O2 sats were 88% with dry cough.   Because of borderline low normal blood pressure cardiology recommended not adding an ACE inhibitor or beta blocker at time of previous discharge and recommended to continue monitoring with the possibility of adding ACE inhibitor or carvedilol if blood pressure could tolerate. She did receive Lasix 20 mg daily for 7 days then recommended prn dosing schedule. Patient was to follow-up with cardiology 1 week after discharge.   Assessment & Plan    Acute respiratory failure with hypoxia: Due to Acute combined systolic and diastolic congestive heart failure / Severe aortic stenosis / Pulmonary hypertension  -Chest x-ray and clinical presentation consistent with heart failure exacerbation (acute hypoxemia, elevated BNP and abnormal chest x-ray)-no lower extremity edema and admission weight unknown -Continue IV diuresis, negative balance of 2.0 L, weight down from 152 -> 141lbs  - Echo 02/2015 EF 40-45% and grade 1 diastolic dysfunction in setting of severe aortic stenosis with associated pulmonary hypertension - Cardiology was consulted recommended  no further cardiac workup, not a candidate for TAVR or AVR given her advanced dementia -Chest x-ray showed no infiltrates. Influenza panel negative.   Active Problems:  Acute hypokalemia -Resolved   Hypothyroidism -Continue preadmission Synthroid  Enterococcus Urinary tract infection -For now, continue IV Rocephin, follow sensitivities -Given prolonged QT avoid floraquinolones   Dry mouth -Likely related to recent thrush although could be related to other medications   QT prolongation -Slightly longer then on December EKG which was 499 ms and has increased since 2012 and 2015 EKGs  HLD -Continue Zocor   Chronic back pain  -Not on chronic medications   Anxiety/ ? Dementia -Monitor for acute delirium during hospitalization -Continue Wellbutrin   Falls frequently -Resident of skilled nursing facility -Uses rolling walker at baseline -PT evaluation  Code Status: Full code  Family Communication: Discussed in detail with the patient, all imaging results, lab results explained to the patient   Disposition Plan: Will need skilled nursing facility  Time Spent in minutes   25 minutes  Procedures  None   Consults   cardiology  DVT Prophylaxis  Lovenox   Medications  Scheduled Meds: . buPROPion  300 mg Oral Daily  . calcium carbonate  1 tablet Oral Q breakfast  .  cefTRIAXone (ROCEPHIN)  IV  1 g Intravenous Q24H  . enoxaparin (LOVENOX) injection  40 mg Subcutaneous Q24H  . furosemide  40 mg Intravenous Q12H  . iron polysaccharides  150 mg Oral Daily  . levothyroxine  88 mcg Oral QAC breakfast  . pantoprazole  40 mg Oral Daily  . potassium chloride  40 mEq Oral Daily  . simvastatin  20 mg Oral QPM  . sodium chloride flush  3 mL Intravenous Q12H   Continuous Infusions:  PRN Meds:.sodium chloride, acetaminophen, ondansetron (ZOFRAN) IV, polyethylene glycol powder, sodium chloride flush   Antibiotics   Anti-infectives    Start     Dose/Rate Route  Frequency Ordered Stop   04/24/15 1500  cefTRIAXone (ROCEPHIN) 1 g in dextrose 5 % 50 mL IVPB     1 g 100 mL/hr over 30 Minutes Intravenous Every 24 hours 04/23/15 1750     04/23/15 1500  cefTRIAXone (ROCEPHIN) 1 g in dextrose 5 % 50 mL IVPB     1 g 100 mL/hr over 30 Minutes Intravenous  Once 04/23/15 1454 04/23/15 1537        Subjective:   Dana [name] was seen and examined today. No acute complaints. Patient denies dizziness, chest pain, shortness of breath, abdominal pain, N/V/D/C, new weakness, numbess, tingling. No acute events overnight.    Objective:   Blood pressure 90/46, pulse 91, temperature 98.6 F (37 C), temperature source Oral, resp. rate 18, height 5\' 5"  (1.651 m), weight 64.28 kg (141 lb 11.4 oz), SpO2 93 %.  Wt Readings from Last 3 Encounters:  04/25/15 64.28 kg (141 lb 11.4 oz)  03/04/15 69.128 kg (152 lb 6.4 oz)     Intake/Output Summary (Last 24 hours) at 04/25/15 1228 Last data filed at 04/25/15 0900  Gross per 24 hour  Intake    800 ml  Output    950 ml  Net   -150 ml    Exam  General: Alert and oriented x 2, NAD  HEENT:  PERRLA, EOMI, Anicteric Sclera, mucous membranes moist.   Neck: Supple, no JVD, no masses  CVS: S1 S2 auscultated, no rubs, 2/6SEM  Respiratory: Bibasilar crackles  Abdomen: Soft, nontender, nondistended, + bowel sounds  Ext: no cyanosis clubbing or edema  Neuro: no new deficits  Data Review   Micro Results Recent Results (from the past 240 hour(s))  Urine culture     Status: None (Preliminary result)   Collection Time: 04/23/15  1:50 PM  Result Value Ref Range Status   Specimen Description URINE, CLEAN CATCH  Final   Special Requests NONE  Final   Culture   Final    >=100,000 COLONIES/mL ENTEROCOCCUS SPECIES SUSCEPTIBILITIES TO FOLLOW    Report Status PENDING  Incomplete  MRSA PCR Screening     Status: None   Collection Time: 04/23/15  6:40 PM  Result Value Ref Range Status   MRSA by PCR NEGATIVE NEGATIVE  Final    Comment:        The GeneXpert MRSA Assay (FDA approved for NASAL specimens only), is one component of a comprehensive MRSA colonization surveillance program. It is not intended to diagnose MRSA infection nor to guide or monitor treatment for MRSA infections.     Radiology Reports Dg Chest 2 View  04/23/2015  CLINICAL DATA:  Worsening shortness of Breath EXAM: CHEST  2 VIEW COMPARISON:  03/04/2015 FINDINGS: Cardiomediastinal silhouette is stable. Tortuous ascending aorta with atherosclerotic calcifications are noted. Hiatal hernia again noted. Again noted bilateral interstitial  prominence suspicious for interstitial edema or pneumonitis. There is trace left pleural effusion with left basilar atelectasis or infiltrate. IMPRESSION: Hiatal hernia again noted. Again noted bilateral interstitial prominence suspicious for interstitial edema or pneumonitis. There is trace left pleural effusion with left basilar atelectasis or infiltrate. Electronically Signed   By: Lahoma Crocker M.D.   On: 04/23/2015 12:57   Dg Chest Port 1 View  04/24/2015  CLINICAL DATA:  CHF EXAM: PORTABLE CHEST 1 VIEW COMPARISON:  04/23/2015 FINDINGS: Cardiac shadow is stable. Aortic calcifications are again identified. Changes consistent with congestive failure are again noted. No focal confluent infiltrate is seen. No sizable effusion is noted. No bony abnormality is seen. IMPRESSION: Stable changes consistent with CHF. Electronically Signed   By: Inez Catalina M.D.   On: 04/24/2015 07:40    CBC  Recent Labs Lab 04/23/15 1325  WBC 12.8*  HGB 11.2*  HCT 34.8*  PLT 300  MCV 95.9  MCH 30.9  MCHC 32.2  RDW 15.2    Chemistries   Recent Labs Lab 04/23/15 1325 04/24/15 0357 04/25/15 0339  NA 143 144 141  K 3.1* 3.8 4.5  CL 101 102 99*  CO2 30 34* 30  GLUCOSE 118* 104* 136*  BUN 11 10 16   CREATININE 0.76 0.79 0.88  CALCIUM 8.7* 8.2* 8.7*    ------------------------------------------------------------------------------------------------------------------ estimated creatinine clearance is 42.1 mL/min (by C-G formula based on Cr of 0.88). ------------------------------------------------------------------------------------------------------------------ No results for input(s): HGBA1C in the last 72 hours. ------------------------------------------------------------------------------------------------------------------ No results for input(s): CHOL, HDL, LDLCALC, TRIG, CHOLHDL, LDLDIRECT in the last 72 hours. ------------------------------------------------------------------------------------------------------------------ No results for input(s): TSH, T4TOTAL, T3FREE, THYROIDAB in the last 72 hours.  Invalid input(s): FREET3 ------------------------------------------------------------------------------------------------------------------ No results for input(s): VITAMINB12, FOLATE, FERRITIN, TIBC, IRON, RETICCTPCT in the last 72 hours.  Coagulation profile No results for input(s): INR, PROTIME in the last 168 hours.  No results for input(s): DDIMER in the last 72 hours.  Cardiac Enzymes  Recent Labs Lab 04/23/15 1708 04/23/15 2207 04/24/15 0357  TROPONINI 0.05* 0.04* 0.04*   ------------------------------------------------------------------------------------------------------------------ Invalid input(s): POCBNP  No results for input(s): GLUCAP in the last 72 hours.   Dana [name] M.D. [facility] 04/25/2015, 12:28 PM  Pager: [phone] Between 7am to 7pm - call Pager - [phone]  After 7pm go to [email or web address] - password [email or web address]  Call night coverage person covering after 7pm

## 2015-04-26 ENCOUNTER — Encounter (HOSPITAL_COMMUNITY): Payer: Self-pay | Admitting: Internal Medicine

## 2015-04-26 LAB — BASIC METABOLIC PANEL
ANION GAP: 9 (ref 5–15)
BUN: 21 mg/dL — ABNORMAL HIGH (ref 6–20)
CO2: 34 mmol/L — ABNORMAL HIGH (ref 22–32)
Calcium: 9.1 mg/dL (ref 8.9–10.3)
Chloride: 96 mmol/L — ABNORMAL LOW (ref 101–111)
Creatinine, Ser: 1.04 mg/dL — ABNORMAL HIGH (ref 0.44–1.00)
GFR, EST AFRICAN AMERICAN: 55 mL/min — AB (ref >60)
GFR, EST NON AFRICAN AMERICAN: 48 mL/min — AB (ref >60)
Glucose, Bld: 124 mg/dL — ABNORMAL HIGH (ref 65–99)
POTASSIUM: 4.4 mmol/L (ref 3.5–5.1)
SODIUM: 139 mmol/L (ref 135–145)

## 2015-04-26 NOTE — Progress Notes (Signed)
[facility]                                                                              Patient Demographics  Dana [name], is a 82 y.o. female, DOB - February 12, 1928, [id number]  Admit date - 04/23/2015   Admitting Physician Erline Hau, MD  Outpatient Primary MD for the patient is Reginia Naas, MD  LOS - 3   Chief Complaint  Patient presents with  . Shortness of Breath       Brief HPI   This is an 82 year old female patient discharged December 2016 after admission for HCAP and combined systolic and diastolic heart failure. Heart failure was a new finding on echocardiogram during that admission. She was evaluated by cardiology. Medical management was recommended.  Patient also has pulmonary hypertension, severe aortic stenosis, was sent to ER due to abrupt onset of dyspnea, O2 sats were 88% with dry cough.   Because of borderline low normal blood pressure cardiology recommended not adding an ACE inhibitor or beta blocker at time of previous discharge and recommended to continue monitoring with the possibility of adding ACE inhibitor or carvedilol if blood pressure could tolerate. She did receive Lasix 20 mg daily for 7 days then recommended prn dosing schedule. Patient was to follow-up with cardiology 1 week after discharge.   Assessment & Plan    Acute respiratory failure with hypoxia: Due to Acute combined systolic and diastolic congestive heart failure / Severe aortic stenosis / Pulmonary hypertension  -Chest x-ray and clinical presentation consistent with heart failure exacerbation (acute hypoxemia, elevated BNP and abnormal chest x-ray)-no lower extremity edema and admission weight unknown -Continue IV diuresis, negative balance of 2.6 L, weight down from 152 -> 137lbs  - Echo 02/2015 EF 40-45% and grade 1 diastolic dysfunction in setting of severe aortic stenosis with associated pulmonary hypertension - Cardiology was consulted recommended  no further cardiac workup, not a candidate for TAVR or AVR given her advanced dementia -Chest x-ray showed no infiltrates. Influenza panel negative.   Active Problems:  Acute hypokalemia -Resolved   Hypothyroidism -Continue preadmission Synthroid  Enterococcus Urinary tract infection -For now, continue IV Rocephin, sensitivities still pending -Given prolonged QT avoid floraquinolones   Dry mouth -Likely related to recent thrush although could be related to other medications   QT prolongation -Slightly longer then on December EKG which was 499 ms and has increased since 2012 and 2015 EKGs  HLD -Continue Zocor   Chronic back pain  -Not on chronic medications   Anxiety/ ? Dementia -Monitor for acute delirium during hospitalization -Continue Wellbutrin   Falls frequently -Resident of skilled nursing facility -Uses rolling walker at baseline -PT evaluation  Code Status: Full code  Family Communication: Discussed in detail with the patient, all imaging results, lab results explained to the patient   Disposition Plan: Hopefully DC tomorrow if improving, and sensitivities available  Time Spent in minutes   25 minutes  Procedures  None   Consults   cardiology  DVT Prophylaxis  Lovenox   Medications  Scheduled Meds: . buPROPion  300 mg Oral Daily  . calcium carbonate  1 tablet  Oral Q breakfast  . cefTRIAXone (ROCEPHIN)  IV  1 g Intravenous Q24H  . enoxaparin (LOVENOX) injection  40 mg Subcutaneous Q24H  . furosemide  40 mg Intravenous Q12H  . iron polysaccharides  150 mg Oral Daily  . levothyroxine  88 mcg Oral QAC breakfast  . pantoprazole  40 mg Oral Daily  . potassium chloride  40 mEq Oral Daily  . simvastatin  20 mg Oral QPM  . sodium chloride flush  3 mL Intravenous Q12H   Continuous Infusions:  PRN Meds:.sodium chloride, acetaminophen, ondansetron (ZOFRAN) IV, polyethylene glycol powder, sodium chloride flush   Antibiotics    Anti-infectives    Start     Dose/Rate Route Frequency Ordered Stop   04/24/15 1500  cefTRIAXone (ROCEPHIN) 1 g in dextrose 5 % 50 mL IVPB     1 g 100 mL/hr over 30 Minutes Intravenous Every 24 hours 04/23/15 1750     04/23/15 1500  cefTRIAXone (ROCEPHIN) 1 g in dextrose 5 % 50 mL IVPB     1 g 100 mL/hr over 30 Minutes Intravenous  Once 04/23/15 1454 04/23/15 1537        Subjective:   Dana [name] was seen and examined today. Overall improving, sitting up in the chair, eating breakfast, denies any specific complaints. Patient denies dizziness, chest pain, shortness of breath, abdominal pain, N/V/D/C, new weakness, numbess, tingling. No acute events overnight.    Objective:   Blood pressure 116/56, pulse 102, temperature 97.7 F (36.5 C), temperature source Oral, resp. rate 20, height 5\' 5"  (1.651 m), weight 62.37 kg (137 lb 8 oz), SpO2 96 %.  Wt Readings from Last 3 Encounters:  04/26/15 62.37 kg (137 lb 8 oz)  03/04/15 69.128 kg (152 lb 6.4 oz)     Intake/Output Summary (Last 24 hours) at 04/26/15 1202 Last data filed at 04/26/15 0954  Gross per 24 hour  Intake    840 ml  Output   1450 ml  Net   -610 ml    Exam  General: Alert and oriented x 2, NAD  HEENT:  PERRLA, EOMI  Neck: Supple, no JVD, no masses  CVS: S1 S2 auscultated, no rubs, 2/6SEM  Respiratory: Bibasilar crackles  Abdomen: Soft, nontender, nondistended, + bowel sounds  Ext: no cyanosis clubbing or edema  Neuro: no new deficits  Data Review   Micro Results Recent Results (from the past 240 hour(s))  Urine culture     Status: None (Preliminary result)   Collection Time: 04/23/15  1:50 PM  Result Value Ref Range Status   Specimen Description URINE, CLEAN CATCH  Final   Special Requests NONE  Final   Culture   Final    >=100,000 COLONIES/mL ENTEROCOCCUS SPECIES SENDING TO REFERENCE LAB FOR SUSCEPTIBILITY TESTING RESULT CALLED TO, READ BACK BY AND VERIFIED WITH: P GERHARD,RN AT 1015  04/26/15 BY L BENFIELD    Report Status PENDING  Incomplete  Respiratory virus panel     Status: None   Collection Time: 04/23/15  6:40 PM  Result Value Ref Range Status   Respiratory Syncytial Virus A Negative Negative Final   Respiratory Syncytial Virus B Negative Negative Final   Influenza A Negative Negative Final   Influenza B Negative Negative Final   Parainfluenza 1 Negative Negative Final   Parainfluenza 2 Negative Negative Final   Parainfluenza 3 Negative Negative Final   Metapneumovirus Negative Negative Final   Rhinovirus Negative Negative Final   Adenovirus Negative Negative Final    Comment: (NOTE)  Performed At: [facility] [place], [place] [id number] Lindon Romp MD [id number]   MRSA PCR Screening     Status: None   Collection Time: 04/23/15  6:40 PM  Result Value Ref Range Status   MRSA by PCR NEGATIVE NEGATIVE Final    Comment:        The GeneXpert MRSA Assay (FDA approved for NASAL specimens only), is one component of a comprehensive MRSA colonization surveillance program. It is not intended to diagnose MRSA infection nor to guide or monitor treatment for MRSA infections.     Radiology Reports Dg Chest 2 View  04/23/2015  CLINICAL DATA:  Worsening shortness of Breath EXAM: CHEST  2 VIEW COMPARISON:  03/04/2015 FINDINGS: Cardiomediastinal silhouette is stable. Tortuous ascending aorta with atherosclerotic calcifications are noted. Hiatal hernia again noted. Again noted bilateral interstitial prominence suspicious for interstitial edema or pneumonitis. There is trace left pleural effusion with left basilar atelectasis or infiltrate. IMPRESSION: Hiatal hernia again noted. Again noted bilateral interstitial prominence suspicious for interstitial edema or pneumonitis. There is trace left pleural effusion with left basilar atelectasis or infiltrate. Electronically Signed   By: Lahoma Crocker M.D.   On: 04/23/2015 12:57   Dg Chest Port 1  View  04/24/2015  CLINICAL DATA:  CHF EXAM: PORTABLE CHEST 1 VIEW COMPARISON:  04/23/2015 FINDINGS: Cardiac shadow is stable. Aortic calcifications are again identified. Changes consistent with congestive failure are again noted. No focal confluent infiltrate is seen. No sizable effusion is noted. No bony abnormality is seen. IMPRESSION: Stable changes consistent with CHF. Electronically Signed   By: Inez Catalina M.D.   On: 04/24/2015 07:40    CBC  Recent Labs Lab 04/23/15 1325  WBC 12.8*  HGB 11.2*  HCT 34.8*  PLT 300  MCV 95.9  MCH 30.9  MCHC 32.2  RDW 15.2    Chemistries   Recent Labs Lab 04/23/15 1325 04/24/15 0357 04/25/15 0339 04/26/15 0524  NA 143 144 141 139  K 3.1* 3.8 4.5 4.4  CL 101 102 99* 96*  CO2 30 34* 30 34*  GLUCOSE 118* 104* 136* 124*  BUN 11 10 16  21*  CREATININE 0.76 0.79 0.88 1.04*  CALCIUM 8.7* 8.2* 8.7* 9.1   ------------------------------------------------------------------------------------------------------------------ estimated creatinine clearance is 35.6 mL/min (by C-G formula based on Cr of 1.04). ------------------------------------------------------------------------------------------------------------------ No results for input(s): HGBA1C in the last 72 hours. ------------------------------------------------------------------------------------------------------------------ No results for input(s): CHOL, HDL, LDLCALC, TRIG, CHOLHDL, LDLDIRECT in the last 72 hours. ------------------------------------------------------------------------------------------------------------------ No results for input(s): TSH, T4TOTAL, T3FREE, THYROIDAB in the last 72 hours.  Invalid input(s): FREET3 ------------------------------------------------------------------------------------------------------------------ No results for input(s): VITAMINB12, FOLATE, FERRITIN, TIBC, IRON, RETICCTPCT in the last 72 hours.  Coagulation profile No results for input(s):  INR, PROTIME in the last 168 hours.  No results for input(s): DDIMER in the last 72 hours.  Cardiac Enzymes  Recent Labs Lab 04/23/15 1708 04/23/15 2207 04/24/15 0357  TROPONINI 0.05* 0.04* 0.04*   ------------------------------------------------------------------------------------------------------------------ Invalid input(s): POCBNP  No results for input(s): GLUCAP in the last 72 hours.   Hien Cunliffe M.D. [facility] 04/26/2015, 12:02 PM  Pager: [phone] Between 7am to 7pm - call Pager - [phone]  After 7pm go to [email or web address] - password [email or web address]  Call night coverage person covering after 7pm

## 2015-04-27 MED ORDER — CETYLPYRIDINIUM CHLORIDE 0.05 % MT LIQD
7.0000 mL | Freq: Two times a day (BID) | OROMUCOSAL | Status: DC
  Administered 2015-04-27 – 2015-04-29 (×4): 7 mL via OROMUCOSAL

## 2015-04-27 NOTE — NC FL2 (Signed)
[place] LEVEL OF CARE SCREENING TOOL     IDENTIFICATION  Patient Name: Dana [name] Birthdate: Feb 12, 1928 Sex: female Admission Date (Current Location): 04/23/2015  [facility] and [place] Number:  Herbalist and Address:  [facility] [place] [address], [place], [place] [address]      Provider Number: [id number]  Attending Physician Name and Address:  Mendel Corning, MD  Relative Name and Phone Number:       Current Level of Care: Hospital Recommended Level of Care: [place] Prior Approval Number:    Date Approved/Denied:   PASRR Number:    Discharge Plan: Home    Current Diagnoses: Patient Active Problem List   Diagnosis Date Noted  . Acute UTI 04/23/2015  . ? Dementia 04/23/2015  . Acute hypokalemia 04/23/2015  . Dry mouth 04/23/2015  . Severe aortic stenosis 04/23/2015  . Pulmonary hypertension ([place]) 04/23/2015  . Leukocytosis 04/23/2015  . QT prolongation 04/23/2015  . Acute combined systolic and diastolic CHF, NYHA class 1 ([place]) 04/23/2015  . Acute combined systolic and diastolic congestive heart failure ([place]) 03/04/2015  . HCAP (healthcare-associated pneumonia) 02/27/2015  . Acute respiratory failure with hypoxia ([place]) 02/24/2015  . CAP (community acquired pneumonia) 02/24/2015  . Tachycardia 02/24/2015  . Falls frequently 02/24/2015  . Hypothyroidism   . Chronic back pain   . History of breast cancer   . History of peptic ulcer   . Anxiety     Orientation RESPIRATION BLADDER Height & Weight     Self, Time, Situation, Place  Normal (2L/min) Incontinent Weight: 136 lb 14.5 oz (62.1 kg) (Scale C) Height:  5\' 5"  (165.1 cm)  BEHAVIORAL SYMPTOMS/MOOD NEUROLOGICAL BOWEL NUTRITION STATUS      Incontinent, Continent  (Heart Healthy)  AMBULATORY STATUS COMMUNICATION OF NEEDS Skin   Extensive Assist Verbally Normal                       Personal Care Assistance Level of Assistance   Bathing, Dressing Bathing Assistance: Limited assistance   Dressing Assistance: Limited assistance     Functional Limitations Info             SPECIAL CARE FACTORS FREQUENCY  PT (By licensed PT), OT (By licensed OT)     PT Frequency: 5 OT Frequency: 5            Contractures Contractures Info: Not present    Additional Factors Info  Code Status, Allergies Code Status Info: Full Allergies Info: Amoxicillin Pot Clauvulanate, Celexa, Iohexol, Pollen Extract, Prozac, Prometazine, Shrimp           Current Medications (04/27/2015):  This is the current hospital active medication list Current Facility-Administered Medications  Medication Dose Route Frequency Provider Last Rate Last Dose  . 0.9 %  sodium chloride infusion  250 mL Intravenous PRN Samella Parr, NP 10 mL/hr at 04/24/15 1617 250 mL at 04/24/15 1617  . acetaminophen (TYLENOL) tablet 650 mg  650 mg Oral Q4H PRN Samella Parr, NP      . antiseptic oral rinse (CPC / CETYLPYRIDINIUM CHLORIDE 0.05%) solution 7 mL  7 mL Mouth Rinse BID Ripudeep K Rai, MD   7 mL at 04/27/15 1244  . buPROPion (WELLBUTRIN XL) 24 hr tablet 300 mg  300 mg Oral Daily Samella Parr, NP   300 mg at 04/27/15 1024  . calcium carbonate (OS-CAL - dosed in mg of elemental calcium) tablet 500  mg of elemental calcium  1 tablet Oral Q breakfast Erline Hau, MD   500 mg of elemental calcium at 04/27/15 0640  . cefTRIAXone (ROCEPHIN) 1 g in dextrose 5 % 50 mL IVPB  1 g Intravenous Q24H Samella Parr, NP   1 g at 04/27/15 1539  . enoxaparin (LOVENOX) injection 40 mg  40 mg Subcutaneous Q24H Samella Parr, NP   40 mg at 04/27/15 1739  . furosemide (LASIX) injection 40 mg  40 mg Intravenous Q12H Samella Parr, NP   40 mg at 04/27/15 1735  . iron polysaccharides (NIFEREX) capsule 150 mg  150 mg Oral Daily Samella Parr, NP   150 mg at 04/27/15 1020  . levothyroxine (SYNTHROID, LEVOTHROID) tablet 88 mcg  88 mcg Oral QAC breakfast  Samella Parr, NP   88 mcg at 04/27/15 0640  . ondansetron (ZOFRAN) injection 4 mg  4 mg Intravenous Q6H PRN Samella Parr, NP      . pantoprazole (PROTONIX) EC tablet 40 mg  40 mg Oral Daily Samella Parr, NP   40 mg at 04/27/15 1014  . polyethylene glycol powder (GLYCOLAX/MIRALAX) container 255 g  1 Container Oral Daily PRN Samella Parr, NP      . potassium chloride SA (K-DUR,KLOR-CON) CR tablet 40 mEq  40 mEq Oral Daily Samella Parr, NP   40 mEq at 04/27/15 1014  . simvastatin (ZOCOR) tablet 20 mg  20 mg Oral QPM Samella Parr, NP   20 mg at 04/27/15 1735  . sodium chloride flush (NS) 0.9 % injection 3 mL  3 mL Intravenous Q12H Samella Parr, NP   3 mL at 04/27/15 2135  . sodium chloride flush (NS) 0.9 % injection 3 mL  3 mL Intravenous PRN Samella Parr, NP   3 mL at 04/27/15 1739     Discharge Medications: Please see discharge summary for a list of discharge medications.  Relevant Imaging Results:  Relevant Lab Results:   Additional Information SSN:  [id number] [place], [place]

## 2015-04-27 NOTE — Progress Notes (Signed)
[facility]                                                                              Patient Demographics  Dana [name], is a 82 y.o. female, DOB - [birth year], [id number]  Admit date - 04/23/2015   Admitting Physician Erline Hau, MD  Outpatient Primary MD for the patient is Reginia Naas, MD  LOS - 4   Chief Complaint  Patient presents with  . Shortness of Breath       Brief HPI   This is an 82 year old female patient discharged December 2016 after admission for HCAP and combined systolic and diastolic heart failure. Heart failure was a new finding on echocardiogram during that admission. She was evaluated by cardiology. Medical management was recommended.  Patient also has pulmonary hypertension, severe aortic stenosis, was sent to ER due to abrupt onset of dyspnea, O2 sats were 88% with dry cough.   Because of borderline low normal blood pressure cardiology recommended not adding an ACE inhibitor or beta blocker at time of previous discharge and recommended to continue monitoring with the possibility of adding ACE inhibitor or carvedilol if blood pressure could tolerate. She did receive Lasix 20 mg daily for 7 days then recommended prn dosing schedule. Patient was to follow-up with cardiology 1 week after discharge.   Assessment & Plan    Acute respiratory failure with hypoxia: Due to Acute combined systolic and diastolic congestive heart failure / Severe aortic stenosis / Pulmonary hypertension  -Chest x-ray and clinical presentation consistent with heart failure exacerbation (acute hypoxemia, elevated BNP and abnormal chest x-ray)-no lower extremity edema and admission weight unknown -Continue IV diuresis, negative balance of 2.6 L, weight down from 152 -> 136lbs  - Echo 02/2015 EF 40-45% and grade 1 diastolic dysfunction in setting of severe aortic stenosis with associated pulmonary hypertension - Cardiology was consulted recommended  no further cardiac workup, not a candidate for TAVR or AVR given her advanced dementia -Chest x-ray showed no infiltrates. Influenza panel negative.   Active Problems:  Acute hypokalemia -Resolved   Hypothyroidism -Continue preadmission Synthroid  Enterococcus Urinary tract infection -For now, continue IV Rocephin, sensitivities still pending -Given prolonged QT avoid floraquinolones   Dry mouth -Likely related to recent thrush although could be related to other medications   QT prolongation -Slightly longer then on December EKG which was 499 ms and has increased since 2012 and 2015 EKGs  HLD -Continue Zocor   Chronic back pain  -Not on chronic medications   Anxiety/ ? Dementia -Monitor for acute delirium during hospitalization -Continue Wellbutrin   Falls frequently -Resident of skilled nursing facility -Uses rolling walker at baseline -PT evaluation -> SNF  Code Status: Full code  Family Communication: Discussed in detail with the patient, all imaging results, lab results explained to the patient   Disposition Plan: Hopefully DC tomorrow if improving, and sensitivities available  Time Spent in minutes   15 minutes  Procedures  None   Consults   cardiology  DVT Prophylaxis  Lovenox   Medications  Scheduled Meds: . antiseptic oral rinse  7 mL Mouth Rinse BID  .  buPROPion  300 mg Oral Daily  . calcium carbonate  1 tablet Oral Q breakfast  . cefTRIAXone (ROCEPHIN)  IV  1 g Intravenous Q24H  . enoxaparin (LOVENOX) injection  40 mg Subcutaneous Q24H  . furosemide  40 mg Intravenous Q12H  . iron polysaccharides  150 mg Oral Daily  . levothyroxine  88 mcg Oral QAC breakfast  . pantoprazole  40 mg Oral Daily  . potassium chloride  40 mEq Oral Daily  . simvastatin  20 mg Oral QPM  . sodium chloride flush  3 mL Intravenous Q12H   Continuous Infusions:  PRN Meds:.sodium chloride, acetaminophen, ondansetron (ZOFRAN) IV, polyethylene glycol powder,  sodium chloride flush   Antibiotics   Anti-infectives    Start     Dose/Rate Route Frequency Ordered Stop   04/24/15 1500  cefTRIAXone (ROCEPHIN) 1 g in dextrose 5 % 50 mL IVPB     1 g 100 mL/hr over 30 Minutes Intravenous Every 24 hours 04/23/15 1750     04/23/15 1500  cefTRIAXone (ROCEPHIN) 1 g in dextrose 5 % 50 mL IVPB     1 g 100 mL/hr over 30 Minutes Intravenous  Once 04/23/15 1454 04/23/15 1537        Subjective:   Dana [name] was seen and examined today. States, "Getting bored", sitting in the chair, eating breakfast. Denied any complaints.  Patient denies dizziness, chest pain, shortness of breath, abdominal pain, N/V/D/C, new weakness, numbess, tingling. No acute events overnight.    Objective:   Blood pressure 94/45, pulse 91, temperature 97.8 F (36.6 C), temperature source Oral, resp. rate 19, height 5\' 5"  (1.651 m), weight 62.1 kg (136 lb 14.5 oz), SpO2 97 %.  Wt Readings from Last 3 Encounters:  04/27/15 62.1 kg (136 lb 14.5 oz)  03/04/15 69.128 kg (152 lb 6.4 oz)     Intake/Output Summary (Last 24 hours) at 04/27/15 1215 Last data filed at 04/27/15 1111  Gross per 24 hour  Intake   1020 ml  Output    775 ml  Net    245 ml    Exam  General: Alert and oriented x 2, NAD  HEENT:  PERRLA, EOMI  Neck: Supple, no JVD, no masses  CVS: S1 S2 auscultated, no rubs, 2/6SEM  Respiratory: Bibasilar crackles  Abdomen: Soft, NT, ND, NBS  Ext: no cyanosis clubbing or edema  Neuro: no new deficits  Data Review   Micro Results Recent Results (from the past 240 hour(s))  Urine culture     Status: None (Preliminary result)   Collection Time: 04/23/15  1:50 PM  Result Value Ref Range Status   Specimen Description URINE, CLEAN CATCH  Final   Special Requests NONE  Final   Culture   Final    >=100,000 COLONIES/mL ENTEROCOCCUS SPECIES SENDING TO REFERENCE LAB FOR SUSCEPTIBILITY TESTING RESULT CALLED TO, READ BACK BY AND VERIFIED WITH: P GERHARD,RN AT  1015 04/26/15 BY L BENFIELD    Report Status PENDING  Incomplete  Respiratory virus panel     Status: None   Collection Time: 04/23/15  6:40 PM  Result Value Ref Range Status   Respiratory Syncytial Virus A Negative Negative Final   Respiratory Syncytial Virus B Negative Negative Final   Influenza A Negative Negative Final   Influenza B Negative Negative Final   Parainfluenza 1 Negative Negative Final   Parainfluenza 2 Negative Negative Final   Parainfluenza 3 Negative Negative Final   Metapneumovirus Negative Negative Final   Rhinovirus Negative Negative  Final   Adenovirus Negative Negative Final    Comment: (NOTE) Performed At: [facility] [place], [place] [id number] Lindon Romp MD [id number]   MRSA PCR Screening     Status: None   Collection Time: 04/23/15  6:40 PM  Result Value Ref Range Status   MRSA by PCR NEGATIVE NEGATIVE Final    Comment:        The GeneXpert MRSA Assay (FDA approved for NASAL specimens only), is one component of a comprehensive MRSA colonization surveillance program. It is not intended to diagnose MRSA infection nor to guide or monitor treatment for MRSA infections.     Radiology Reports Dg Chest 2 View  04/23/2015  CLINICAL DATA:  Worsening shortness of Breath EXAM: CHEST  2 VIEW COMPARISON:  03/04/2015 FINDINGS: Cardiomediastinal silhouette is stable. Tortuous ascending aorta with atherosclerotic calcifications are noted. Hiatal hernia again noted. Again noted bilateral interstitial prominence suspicious for interstitial edema or pneumonitis. There is trace left pleural effusion with left basilar atelectasis or infiltrate. IMPRESSION: Hiatal hernia again noted. Again noted bilateral interstitial prominence suspicious for interstitial edema or pneumonitis. There is trace left pleural effusion with left basilar atelectasis or infiltrate. Electronically Signed   By: Lahoma Crocker M.D.   On: 04/23/2015 12:57   Dg Chest Port  1 View  04/24/2015  CLINICAL DATA:  CHF EXAM: PORTABLE CHEST 1 VIEW COMPARISON:  04/23/2015 FINDINGS: Cardiac shadow is stable. Aortic calcifications are again identified. Changes consistent with congestive failure are again noted. No focal confluent infiltrate is seen. No sizable effusion is noted. No bony abnormality is seen. IMPRESSION: Stable changes consistent with CHF. Electronically Signed   By: Inez Catalina M.D.   On: 04/24/2015 07:40    CBC  Recent Labs Lab 04/23/15 1325  WBC 12.8*  HGB 11.2*  HCT 34.8*  PLT 300  MCV 95.9  MCH 30.9  MCHC 32.2  RDW 15.2    Chemistries   Recent Labs Lab 04/23/15 1325 04/24/15 0357 04/25/15 0339 04/26/15 0524  NA 143 144 141 139  K 3.1* 3.8 4.5 4.4  CL 101 102 99* 96*  CO2 30 34* 30 34*  GLUCOSE 118* 104* 136* 124*  BUN 11 10 16  21*  CREATININE 0.76 0.79 0.88 1.04*  CALCIUM 8.7* 8.2* 8.7* 9.1   ------------------------------------------------------------------------------------------------------------------ estimated creatinine clearance is 35.6 mL/min (by C-G formula based on Cr of 1.04). ------------------------------------------------------------------------------------------------------------------ No results for input(s): HGBA1C in the last 72 hours. ------------------------------------------------------------------------------------------------------------------ No results for input(s): CHOL, HDL, LDLCALC, TRIG, CHOLHDL, LDLDIRECT in the last 72 hours. ------------------------------------------------------------------------------------------------------------------ No results for input(s): TSH, T4TOTAL, T3FREE, THYROIDAB in the last 72 hours.  Invalid input(s): FREET3 ------------------------------------------------------------------------------------------------------------------ No results for input(s): VITAMINB12, FOLATE, FERRITIN, TIBC, IRON, RETICCTPCT in the last 72 hours.  Coagulation profile No results for  input(s): INR, PROTIME in the last 168 hours.  No results for input(s): DDIMER in the last 72 hours.  Cardiac Enzymes  Recent Labs Lab 04/23/15 1708 04/23/15 2207 04/24/15 0357  TROPONINI 0.05* 0.04* 0.04*   ------------------------------------------------------------------------------------------------------------------ Invalid input(s): POCBNP  No results for input(s): GLUCAP in the last 72 hours.   Marcile Fuquay M.D. [facility] 04/27/2015, 12:15 PM  Pager: [phone] Between 7am to 7pm - call Pager - [phone]  After 7pm go to [email or web address] - password [email or web address]  Call night coverage person covering after 7pm

## 2015-04-28 DIAGNOSIS — F09 Unspecified mental disorder due to known physiological condition: Secondary | ICD-10-CM

## 2015-04-28 DIAGNOSIS — F4323 Adjustment disorder with mixed anxiety and depressed mood: Secondary | ICD-10-CM | POA: Clinically undetermined

## 2015-04-28 LAB — BASIC METABOLIC PANEL
ANION GAP: 14 (ref 5–15)
BUN: 21 mg/dL — AB (ref 6–20)
CALCIUM: 9.3 mg/dL (ref 8.9–10.3)
CO2: 29 mmol/L (ref 22–32)
Chloride: 96 mmol/L — ABNORMAL LOW (ref 101–111)
Creatinine, Ser: 0.92 mg/dL (ref 0.44–1.00)
GFR calc Af Amer: >60 mL/min (ref >60)
GFR, EST NON AFRICAN AMERICAN: 55 mL/min — AB (ref >60)
GLUCOSE: 108 mg/dL — AB (ref 65–99)
Potassium: 4.3 mmol/L (ref 3.5–5.1)
Sodium: 139 mmol/L (ref 135–145)

## 2015-04-28 MED ORDER — POTASSIUM CHLORIDE CRYS ER 20 MEQ PO TBCR
20.0000 meq | EXTENDED_RELEASE_TABLET | Freq: Every day | ORAL | Status: DC
  Administered 2015-04-28 – 2015-04-29 (×2): 20 meq via ORAL
  Filled 2015-04-28 (×2): qty 1

## 2015-04-28 MED ORDER — FUROSEMIDE 40 MG PO TABS: 40.0000 mg | ORAL_TABLET | Freq: Every day | ORAL | Status: DC

## 2015-04-28 MED ORDER — FUROSEMIDE 40 MG PO TABS
40.0000 mg | ORAL_TABLET | Freq: Two times a day (BID) | ORAL | Status: DC
  Administered 2015-04-28 – 2015-04-29 (×3): 40 mg via ORAL
  Filled 2015-04-28 (×4): qty 1

## 2015-04-28 NOTE — Progress Notes (Signed)
Physical Therapy Treatment Patient Details Name: JENISA LAUBSCHER MRN: [id number] DOB: 1928/02/25 Today's Date: 04/28/2015    History of Present Illness RAKIA LEHEW is a 82 y.o. female with a Past Medical History of hyperthyroidism, GERD, depression, anxiety, sensory hallucinations, non-Hodgkin's lymphoma and breast cancer in remission since 2011 who presents with acute respiratory failure.     PT Comments    Patient progressing with ambulation distance and independence, though still needs supervision for safety due to cognitive deficits, decreased vision and poor safety awareness as well as decreased endurance.  Feel she is agreeing to SNF per my discussion with her, but doesn't want her daughter to have to take her to "rehab" due to she can't even drive her to the doctor.  But seems to be agreeing to inpatient facility.  Will continue skilled PT in acute setting until d/c.  Follow Up Recommendations  SNF     Equipment Recommendations  None recommended by PT    Recommendations for Other Services       Precautions / Restrictions Precautions Precautions: Fall Restrictions Weight Bearing Restrictions: No    Mobility  Bed Mobility               General bed mobility comments: up in recliner  Transfers Overall transfer level: Needs assistance Equipment used: Rolling walker (2 wheeled) Transfers: Sit to/from Stand Sit to Stand: Supervision         General transfer comment: needed supervision for safety due to decreased vision and need for environmental set up for safety including O2  Ambulation/Gait Ambulation/Gait assistance: Supervision Ambulation Distance (Feet): 70 Feet Assistive device: Rolling walker (2 wheeled) Gait Pattern/deviations: Step-through pattern;Trendelenburg;Shuffle;Narrow base of support     General Gait Details: demonstrates lower L hip and indicates pain with ambulation,  Able to turn RW (not rollator) with cues and assist for safety;  limited ambulation distance due to pt c/o pain   Stairs            Wheelchair Mobility    Modified Rankin (Stroke Patients Only)       Balance Overall balance assessment: Needs assistance         Standing balance support: Bilateral upper extremity supported Standing balance-Leahy Scale: Poor Standing balance comment: UE support needed for balance                    Cognition Arousal/Alertness: Awake/alert Behavior During Therapy: WFL for tasks assessed/performed Overall Cognitive Status: No family/caregiver present to determine baseline cognitive functioning (did not know she was here for CHF, though due to medication for thrush)                      Exercises      General Comments General comments (skin integrity, edema, etc.): 97% after ambulation on 2L O2      Pertinent Vitals/Pain Pain Assessment: 0-10 Pain Score: 6  Pain Location: sciatic pain L hip Pain Descriptors / Indicators: Aching;Radiating Pain Intervention(s): Monitored during session;Limited activity within patient's tolerance    Home Living                      Prior Function            PT Goals (current goals can now be found in the care plan section) Progress towards PT goals: Progressing toward goals    Frequency  Min 2X/week    PT Plan Current plan remains appropriate    Co-evaluation  End of Session Equipment Utilized During Treatment: Gait belt;Oxygen Activity Tolerance: Patient limited by fatigue;Patient limited by pain Patient left: in chair;with call bell/phone within reach;with chair alarm set     Time: [id number] PT Time Calculation (min) (ACUTE ONLY): 22 min  Charges:  $Gait Training: 8-22 mins                    G Codes:      Reginia Naas 2015-05-11, 4:16 PM  Magda Kiel, [place] 2015-05-11

## 2015-04-28 NOTE — Progress Notes (Signed)
PHARMACY CONSULT NOTE - INITIAL    I spoke with the micro lab regarding Dana [name]'s urine culture. The preliminary results are showing the Enterococcus is resistant to ampicillin, nitrofurantoin, levaquin and vancomycin. The Final results should be in the system by tomorrow.  Today is day#6 of ceftriaxone, I recommend stopping ceftriaxone and starting fosfomycin. If this is a complicated UTI, consider an extended course of Fosfomycin, 3 g PO every other day x 3 doses.   Patient Measurements: Height: 5\' 5"  (165.1 cm) Weight: 135 lb 11.2 oz (61.553 kg) (scale c) IBW/kg (Calculated) : 57   Vital Signs: Temp: 97.8 F (36.6 C) (02/07 0639) Temp Source: Oral (02/07 0639) BP: 100/40 mmHg (02/07 0639) Pulse Rate: 90 (02/07 0639)  Labs:  Recent Labs  04/26/15 0524 04/28/15 0330  CREATININE 1.04* 0.92   Estimated Creatinine Clearance: 40.2 mL/min (by C-G formula based on Cr of 0.92).    Medical History: Past Medical History  Diagnosis Date  . Hypothyroidism   . Esophageal reflux   . Seasonal allergies   . Hypercholesteremia   . Depression   . Anxiety   . History of diverticulitis of colon   . History of peptic ulcer   . Chronic back pain     "gets it worse as it goes down" (02/24/2015)  . Bladder prolapse, female, acquired   . Heart murmur   . History of electroconvulsive therapy 1956    "then had insulin shock; for deep deep depression"  . History of hiatal hernia   . Migraine     "might have one maybe once/month" (02/24/2015)  . Arthritis     "in my back" (02/24/2015)  . Breast cancer, left breast ([place]) 1970  . Non Hodgkin's lymphoma ([place]) 2001  . Basal cell carcinoma of nasal tip     Dana [name] 04/28/2015,9:24 AM

## 2015-04-28 NOTE — Consult Note (Signed)
[facility] Consult   Reason for Consult:  Capacity evaluation Referring Physician:  Dr. Tana Coast Patient Identification: Dana [name] MRN:  [id number] Principal Diagnosis: Cognitive disorder Diagnosis:   Patient Active Problem List   Diagnosis Date Noted  . Cognitive disorder [F09] 04/28/2015  . Adjustment disorder with mixed anxiety and depressed mood [F43.23] 04/28/2015  . Acute UTI [N39.0] 04/23/2015  . ? Dementia [F03.90] 04/23/2015  . Acute hypokalemia [E87.6] 04/23/2015  . Dry mouth [R68.2] 04/23/2015  . Severe aortic stenosis [I35.0] 04/23/2015  . Pulmonary hypertension ([place]) [I27.2] 04/23/2015  . Leukocytosis [D72.829] 04/23/2015  . QT prolongation [I45.81] 04/23/2015  . Acute combined systolic and diastolic CHF, NYHA class 1 ([place]) [I50.41] 04/23/2015  . Acute combined systolic and diastolic congestive heart failure ([place]) [I50.41] 03/04/2015  . HCAP (healthcare-associated pneumonia) [J18.9] 02/27/2015  . Acute respiratory failure with hypoxia ([place]) [J96.01] 02/24/2015  . CAP (community acquired pneumonia) [J18.9] 02/24/2015  . Tachycardia [R00.0] 02/24/2015  . Falls frequently [R29.6] 02/24/2015  . Hypothyroidism [E03.9]   . Chronic back pain [M54.9, G89.29]   . History of breast cancer [Z85.3]   . History of peptic ulcer [Z87.11]   . Anxiety [F41.9]     Total Time spent with patient: 1 hour  Subjective:   Dana [name] is a 82 y.o. female patient admitted with congestive heart failure.  HPI:  Dana [name] is an 82 year old female admitted to [facility] for multiple medical problems including congestive heart failure. Patient is also suffering with  pulmonary hypertension, severe aortic stenosis, borderline low normal blood pressure. Patient seen face-to-face for psychiatric consultation and also reviewed available medical records for capacity evaluation. Patient appeared sitting in a chair next to her bed and seems to be frustrated that she cannot  get out of her bed or chair. Patient is seeking help to transport from bed to bathroom. Patient reportedly having a hard time to hear and also poor eyesight due to macular degeneration. Patient has been living by herself and has no dependable support. Patient has significant cognitive deficits including memory and has limited understanding about her severe complicated medical problems and has limited complicated ADLs. Patient is not safe to be leaving herself based on her limitations both physically, emotionally and cognitively. Patient could not give any reasons why she does not want to be placed in skilled nursing facility except saying I do not want to be told by the staff members what to do and she also used to [place] asked her living facility because they do not treat her right.   Past Psychiatric History: Patient has no history of acute psychiatric hospitalizations.  Risk to Self: Is patient at risk for suicide?: No Risk to Others:   Prior Inpatient Therapy:   Prior Outpatient Therapy:    Past Medical History:  Past Medical History  Diagnosis Date  . Hypothyroidism   . Esophageal reflux   . Seasonal allergies   . Hypercholesteremia   . Depression   . Anxiety   . History of diverticulitis of colon   . History of peptic ulcer   . Chronic back pain     "gets it worse as it goes down" (02/24/2015)  . Bladder prolapse, female, acquired   . Heart murmur   . History of electroconvulsive therapy 1956    "then had insulin shock; for deep deep depression"  . History of hiatal hernia   . Migraine     "might have one maybe once/month" (02/24/2015)  . Arthritis     "  in my back" (02/24/2015)  . Breast cancer, left breast ([place]) 1970  . Non Hodgkin's lymphoma ([place]) 2001  . Basal cell carcinoma of nasal tip     Past Surgical History  Procedure Laterality Date  . Lumbar laminectomy  2012  . Mastectomy, radical Left 1970  . Back surgery    . Abdominal hysterectomy      "took out my  uterus only"  . Cataract extraction w/ intraocular lens  implant, bilateral    . Breast biopsy Left 1970  . Bone marrow biopsy  2001    "for non-Hodgkin's lymphoma"   Family History:  Family History  Problem Relation Age of Onset  . Diabetes Mother   . Heart disease Mother   . Heart attack Mother   . Cancer - Prostate Father    Family Psychiatric  History: Unknown Social History:  History  Alcohol Use  . 0.6 oz/week  . 1 Glasses of wine per week     History  Drug Use No    Social History   Social History  . Marital Status: Widowed    Spouse Name: N/A  . Number of Children: N/A  . Years of Education: N/A   Social History Main Topics  . Smoking status: Former Smoker -- 1.00 packs/day for 5 years    Types: Cigarettes  . Smokeless tobacco: Never Used  . Alcohol Use: 0.6 oz/week    1 Glasses of wine per week  . Drug Use: No  . Sexual Activity: Not Asked   Other Topics Concern  . None   Social History Narrative   Additional Social History:    Allergies:   Allergies  Allergen Reactions  . Amoxicillin Other (See Comments)    Excessive sweating   . Amoxicillin-Pot Clavulanate Other (See Comments)  . Celexa [Citalopram Hydrobromide]     unknown  . Iohexol      Desc: will need premedicated   . Pollen Extract   . Prozac [Fluoxetine Hcl]     unknown  . Promethazine Other (See Comments)    Restlessness    . Shrimp [Shellfish Allergy] Itching    Labs:  Results for orders placed or performed during the hospital encounter of 04/23/15 (from the past 48 hour(s))  Basic metabolic panel     Status: Abnormal   Collection Time: 04/28/15  3:30 AM  Result Value Ref Range   Sodium 139 135 - 145 mmol/L   Potassium 4.3 3.5 - 5.1 mmol/L   Chloride 96 (L) 101 - 111 mmol/L   CO2 29 22 - 32 mmol/L   Glucose, Bld 108 (H) 65 - 99 mg/dL   BUN 21 (H) 6 - 20 mg/dL   Creatinine, Ser 0.92 0.44 - 1.00 mg/dL   Calcium 9.3 8.9 - 10.3 mg/dL   GFR calc non Af Amer 55 (L) >60  mL/min   GFR calc Af Amer >60 >60 mL/min    Comment: (NOTE) The eGFR has been calculated using the CKD EPI equation. This calculation has not been validated in all clinical situations. eGFR's persistently <60 mL/min signify possible Chronic Kidney Disease.    Anion gap 14 5 - 15    Current Facility-Administered Medications  Medication Dose Route Frequency Provider Last Rate Last Dose  . 0.9 %  sodium chloride infusion  250 mL Intravenous PRN Samella Parr, NP 10 mL/hr at 04/24/15 1617 250 mL at 04/24/15 1617  . acetaminophen (TYLENOL) tablet 650 mg  650 mg Oral Q4H PRN Leisa Lenz  Lissa Merlin, NP      . antiseptic oral rinse (CPC / CETYLPYRIDINIUM CHLORIDE 0.05%) solution 7 mL  7 mL Mouth Rinse BID Ripudeep K Rai, MD   7 mL at 04/28/15 1000  . buPROPion (WELLBUTRIN XL) 24 hr tablet 300 mg  300 mg Oral Daily Samella Parr, NP   300 mg at 04/28/15 1033  . calcium carbonate (OS-CAL - dosed in mg of elemental calcium) tablet 500 mg of elemental calcium  1 tablet Oral Q breakfast Erline Hau, MD   500 mg of elemental calcium at 04/28/15 0946  . enoxaparin (LOVENOX) injection 40 mg  40 mg Subcutaneous Q24H Samella Parr, NP   40 mg at 04/27/15 1739  . furosemide (LASIX) tablet 40 mg  40 mg Oral BID Ripudeep Krystal Eaton, MD   40 mg at 04/28/15 0946  . iron polysaccharides (NIFEREX) capsule 150 mg  150 mg Oral Daily Samella Parr, NP   150 mg at 04/28/15 0946  . levothyroxine (SYNTHROID, LEVOTHROID) tablet 88 mcg  88 mcg Oral QAC breakfast Samella Parr, NP   88 mcg at 04/28/15 [phone]  . ondansetron (ZOFRAN) injection 4 mg  4 mg Intravenous Q6H PRN Samella Parr, NP      . pantoprazole (PROTONIX) EC tablet 40 mg  40 mg Oral Daily Samella Parr, NP   40 mg at 04/28/15 0946  . polyethylene glycol powder (GLYCOLAX/MIRALAX) container 255 g  1 Container Oral Daily PRN Samella Parr, NP      . potassium chloride SA (K-DUR,KLOR-CON) CR tablet 20 mEq  20 mEq Oral Daily Ripudeep Krystal Eaton, MD   20  mEq at 04/28/15 0946  . simvastatin (ZOCOR) tablet 20 mg  20 mg Oral QPM Samella Parr, NP   20 mg at 04/27/15 1735  . sodium chloride flush (NS) 0.9 % injection 3 mL  3 mL Intravenous Q12H Samella Parr, NP   3 mL at 04/28/15 1000  . sodium chloride flush (NS) 0.9 % injection 3 mL  3 mL Intravenous PRN Samella Parr, NP   3 mL at 04/27/15 1739    Musculoskeletal: Strength & Muscle Tone: decreased Gait & Station: unable to stand Patient leans: N/A  Psychiatric Specialty Exam: ROS No Fever-chills, No Headache, No changes with Vision or hearing, reports vertigo No problems swallowing food or Liquids, No Chest pain, Cough or Shortness of Breath, No Abdominal pain, No Nausea or Vommitting, Bowel movements are regular, No Blood in stool or Urine, No dysuria, No new skin rashes or bruises, No new joints pains-aches,  No new weakness, tingling, numbness in any extremity, No recent weight gain or loss, No polyuria, polydypsia or polyphagia,  A full 10 point Review of Systems was done, except as stated above, all other Review of Systems were negative.  Blood pressure 110/67, pulse 101, temperature 97.6 F (36.4 C), temperature source Oral, resp. rate 18, height '5\' 5"'  (1.651 m), weight 61.553 kg (135 lb 11.2 oz), SpO2 99 %.Body mass index is 22.58 kg/(m^2).  General Appearance: Guarded  Eye Contact::  Fair, decreased eyesight secondary to macular degeneration   Speech:  Clear and Coherent  Volume:  Normal  Mood:  Anxious and Depressed  Affect:  Depressed  Thought Process:  Coherent and Goal Directed  Orientation:  Full (Time, Place, and Person)  Thought Content:  Rumination  Suicidal Thoughts:  No  Homicidal Thoughts:  No  Memory:  Immediate;   Fair Recent;  Poor  Judgement:  Impaired  Insight:  Shallow  Psychomotor Activity:  Decreased  Concentration:  Fair  Recall:  Poor  Fund of Knowledge:Good  Language: Good  Akathisia:  Negative  Handed:  Right  AIMS (if  indicated):     Assets:  Communication Skills Desire for Improvement Financial Resources/Insurance Leisure Time Resilience Social Support  ADL's:  Impaired  Cognition: Impaired,  Mild  Sleep:      Treatment Plan Summary: Daily contact with patient to assess and evaluate symptoms and progress in treatment and Medication management  Patient does not meet criteria for capacity to make her own medical decisions and living arrangements based on my evaluation today. Patient has poor insight, judgment regarding her medical condition and required treatments. Referred to the unit social worker regarding medical care power of attorney if needed  Disposition: Patient does not meet criteria for psychiatric inpatient admission. Supportive therapy provided about ongoing stressors.  Durward Parcel., MD 04/28/2015 3:41 PM

## 2015-04-28 NOTE — NC FL2 (Signed)
Dana [name] LEVEL OF CARE SCREENING TOOL     IDENTIFICATION  Patient Name: Dana [name] Birthdate: 1927/07/13 Sex: female Admission Date (Current Location): 04/23/2015  Dana [name] and Dana [name] Number:  Dana [name] and Address:  Dana [name]. Dana [name], [place] [address], [place], [place] [address]      Provider Number: Dana [name]  Attending Physician Name and Address:  Dana Corning, Dana [name]  Relative Name and Phone Number:       Current Level of Care: Hospital Recommended Level of Care: Dana [name] Prior Approval Number:    Date Approved/Denied:   PASRR Number: [id number]  Discharge Plan: Home    Current Diagnoses: Patient Active Problem List   Diagnosis Date Noted  . Acute UTI 04/23/2015  . ? Dementia 04/23/2015  . Acute hypokalemia 04/23/2015  . Dry mouth 04/23/2015  . Severe aortic stenosis 04/23/2015  . Pulmonary hypertension ([place]) 04/23/2015  . Leukocytosis 04/23/2015  . QT prolongation 04/23/2015  . Acute combined systolic and diastolic CHF, NYHA class 1 ([place]) 04/23/2015  . Acute combined systolic and diastolic congestive heart failure ([place]) 03/04/2015  . HCAP (healthcare-associated pneumonia) 02/27/2015  . Acute respiratory failure with hypoxia ([place]) 02/24/2015  . CAP (community acquired pneumonia) 02/24/2015  . Tachycardia 02/24/2015  . Falls frequently 02/24/2015  . Hypothyroidism   . Chronic back pain   . History of breast cancer   . History of peptic ulcer   . Anxiety     Orientation RESPIRATION BLADDER Height & Weight     Self, Time, Situation, Place  Normal (2L/min) Incontinent Weight: 135 lb 11.2 oz (61.553 kg) (scale [name]) Height:  5\' 5"  (165.1 cm)  BEHAVIORAL SYMPTOMS/MOOD NEUROLOGICAL BOWEL NUTRITION STATUS      Incontinent, Continent  (Heart Healthy)  AMBULATORY STATUS COMMUNICATION OF NEEDS Skin   Extensive Assist Verbally Normal                       Personal Care Assistance Level of  Assistance  Bathing, Dressing Bathing Assistance: Limited assistance   Dressing Assistance: Limited assistance     Functional Limitations Info             SPECIAL CARE FACTORS FREQUENCY  PT (By licensed PT), OT (By licensed OT)     PT Frequency: 5 OT Frequency: 5            Contractures Contractures Info: Not present    Additional Factors Info  Code Status, Allergies Code Status Info: Full Allergies Info: Amoxicillin Pot Clauvulanate, Celexa, Iohexol, Pollen Extract, Prozac, Prometazine, Shrimp           Current Medications (04/28/2015):  This is Dana current hospital active medication list Current Facility-Administered Medications  Medication Dose Route Frequency Provider Last Rate Last Dose  . 0.9 %  sodium chloride infusion  250 mL Intravenous PRN Dana Parr, Dana [name] 10 mL/hr at 04/24/15 1617 250 mL at 04/24/15 1617  . acetaminophen (TYLENOL) tablet 650 mg  650 mg Oral Q4H PRN Dana Parr, Dana [name]      . antiseptic oral rinse (CPC / CETYLPYRIDINIUM CHLORIDE 0.05%) solution 7 mL  7 mL Mouth Rinse BID Dana K Rai, Dana [name]   7 mL at 04/28/15 1000  . buPROPion (WELLBUTRIN XL) 24 hr tablet 300 mg  300 mg Oral Daily Dana Parr, Dana [name]   300 mg at 04/28/15 1033  . calcium carbonate (OS-CAL - dosed in mg of elemental calcium) tablet 500 mg  of elemental calcium  1 tablet Oral Q breakfast Dana Hau, Dana [name]   500 mg of elemental calcium at 04/28/15 0946  . cefTRIAXone (ROCEPHIN) 1 g in dextrose 5 % 50 mL IVPB  1 g Intravenous Q24H Dana Parr, Dana [name]   1 g at 04/27/15 1539  . enoxaparin (LOVENOX) injection 40 mg  40 mg Subcutaneous Q24H Dana Parr, Dana [name]   40 mg at 04/27/15 1739  . furosemide (LASIX) tablet 40 mg  40 mg Oral BID Dana Dana Eaton, Dana [name]   40 mg at 04/28/15 0946  . iron polysaccharides (NIFEREX) capsule 150 mg  150 mg Oral Daily Dana Parr, Dana [name]   150 mg at 04/28/15 0946  . levothyroxine (SYNTHROID, LEVOTHROID) tablet 88 mcg  88 mcg Oral QAC breakfast  Dana Parr, Dana [name]   88 mcg at 04/28/15 [phone]  . ondansetron (ZOFRAN) injection 4 mg  4 mg Intravenous Q6H PRN Dana Parr, Dana [name]      . pantoprazole (PROTONIX) EC tablet 40 mg  40 mg Oral Daily Dana Parr, Dana [name]   40 mg at 04/28/15 0946  . polyethylene glycol powder (GLYCOLAX/MIRALAX) container 255 g  1 Container Oral Daily PRN Dana Parr, Dana [name]      . potassium chloride SA (K-DUR,KLOR-CON) CR tablet 20 mEq  20 mEq Oral Daily Dana Dana Eaton, Dana [name]   20 mEq at 04/28/15 0946  . simvastatin (ZOCOR) tablet 20 mg  20 mg Oral QPM Dana Parr, Dana [name]   20 mg at 04/27/15 1735  . sodium chloride flush (NS) 0.9 % injection 3 mL  3 mL Intravenous Q12H Dana Parr, Dana [name]   3 mL at 04/28/15 1000  . sodium chloride flush (NS) 0.9 % injection 3 mL  3 mL Intravenous PRN Dana Parr, Dana [name]   3 mL at 04/27/15 1739     Discharge Medications: Please see discharge summary for a list of discharge medications.  Relevant Imaging Results:  Relevant Lab Results:   Additional Information SSN:  [place], Dana Wandel [name], Dana [name]

## 2015-04-28 NOTE — Clinical Social Work Note (Signed)
Clinical Social Work Assessment  Patient Details  Name: Dana [name] MRN: [id number] Date of Birth: [birth year]  Date of referral:  04/28/15               Reason for consult:  Facility Placement                Permission sought to share information with:  Facility Art therapist granted to share information::  Yes, Verbal Permission Granted  Name::        Agency::     Relationship::     Contact Information:     Housing/Transportation Living arrangements for the past 2 months:  [facility]) Source of Information:  Patient Patient Interpreter Needed:  None Criminal Activity/Legal Involvement Pertinent to Current Situation/Hospitalization:  No - Comment as needed Significant Relationships:  Adult Children (Daughter Dana [name] [phone])) Lives with:  Facility Resident Do you feel safe going back to the place where you live?  Yes Need for family participation in patient care:  No (Coment)  Care giving concerns:  Patient does not have 24 hour assistance in the independent living part of [facility]. PT recommend SNF.   Social Worker assessment / plan:  BSW intern entered patient room. Patient was alert and oriented. Patient stated how she was from [facility]. BSW intern confirmed that patient is from [facility] but not the ASL part she is from the independent part, therefore patient can not return until after she has gotten rehab to be medically stable to live on her on again.  Patient is also hard of hearing on her right side. BSW intern will follow up on patient decision in regards to facility placement.  Employment status:  Retired Forensic scientist:  Medicare PT Recommendations:  [place] / Referral to community resources:  [place]  Patient/Family's Response to care:  Patient has good insight on response to care.  Patient/Family's Understanding of and Emotional  Response to Diagnosis, Current Treatment, and Prognosis:  Patient understands current treatment and diagnosis.  Emotional Assessment Appearance:  Appears stated age Attitude/Demeanor/Rapport:   (nice, welcoming) Affect (typically observed):  Accepting, Calm Orientation:  Oriented to Self, Oriented to Place, Oriented to  Time, Oriented to Situation Alcohol / Substance use:  Never Used Psych involvement (Current and /or in the community):  No (Comment)  Discharge Needs  Concerns to be addressed:  No discharge needs identified Readmission within the last 30 days:  No Current discharge risk:  None Barriers to Discharge:  No Barriers Identified   [place] intern  [phone]

## 2015-04-28 NOTE — Clinical Social Work Note (Addendum)
CSW met with patient at bedside after BSW Intern assessed patient.  Patient is from [place] not ALF.  Patient wishes to return home with home health and is refusing SNF at this time.  Patient was very hard of hearing and CSW unable to determine patient's level of understanding of both her needed therapies and her current physical condition/limitations.  CSW reviewed disposition with RNCM.   Patient states that she has children, but none of which can help.  She states, "they don't even have time to drive me anywhere anymore..."    CSW contacted MD to determine if capacity evaluation is needed. CSW awaiting MD recommendation. Disposition: TBD- patient refusing SNF- wishing to return to IND LIV at [place], [place] [phone]  5N1-9; 2S 15-16 and [place] Licensed Clinical Social Worker

## 2015-04-28 NOTE — Progress Notes (Addendum)
[facility]                                                                              Patient Demographics  Dana [name], is a 82 y.o. female, DOB - 12-13-1927, [id number]  Admit date - 04/23/2015   Admitting Physician Erline Hau, MD  Outpatient Primary MD for the patient is Reginia Naas, MD  LOS - 5   Chief Complaint  Patient presents with  . Shortness of Breath       Brief HPI   This is an 82 year old female patient discharged December 2016 after admission for HCAP and combined systolic and diastolic heart failure. Heart failure was a new finding on echocardiogram during that admission. She was evaluated by cardiology. Medical management was recommended.  Patient also has pulmonary hypertension, severe aortic stenosis, was sent to ER due to abrupt onset of dyspnea, O2 sats were 88% with dry cough.   Because of borderline low normal blood pressure cardiology recommended not adding an ACE inhibitor or beta blocker at time of previous discharge and recommended to continue monitoring with the possibility of adding ACE inhibitor or carvedilol if blood pressure could tolerate. She did receive Lasix 20 mg daily for 7 days then recommended prn dosing schedule. Patient was to follow-up with cardiology 1 week after discharge.   Assessment & Plan    Acute respiratory failure with hypoxia: Due to Acute combined systolic and diastolic congestive heart failure / Severe aortic stenosis / Pulmonary hypertension  -Chest x-ray and clinical presentation consistent with heart failure exacerbation (acute hypoxemia, elevated BNP and abnormal chest x-ray)-no lower extremity edema and admission weight unknown -Continue IV diuresis, negative balance of 2.9 L, weight down from 152 -> 135lbs , transitioned to oral Lasix - Echo 02/2015 EF 40-45% and grade 1 diastolic dysfunction in setting of severe aortic stenosis with associated pulmonary hypertension -  Cardiology was consulted recommended no further cardiac workup, not a candidate for TAVR or AVR given her advanced dementia -Chest x-ray showed no infiltrates. Influenza panel negative.   Active Problems:  Acute hypokalemia -Resolved   Hypothyroidism -Continue preadmission Synthroid  Enterococcus Urinary tract infection - on IV Rocephin day #6, sensitivities still pending - Pharmacy called the micro-lab, enterococcus is resistant to ampicillin, nitrofurantoin, Levaquin and vancomycin. Final sensitivities are still pending. Discussed with infectious disease, Dr. Graylon Good, recommended to stop IV Rocephin and no need to treat further. No need for fosfomycin either. Patient has no fevers, chills or dysuria. -Given prolonged QT avoid floraquinolones   Dry mouth -Likely related to recent thrush although could be related to other medications   QT prolongation -Slightly longer then on December EKG which was 499 ms and has increased since 2012 and 2015 EKGs  HLD -Continue Zocor   Chronic back pain  -Not on chronic medications   Anxiety/ ? Dementia -Monitor for acute delirium during hospitalization -Continue Wellbutrin - Patient now refusing skilled nursing facility, does not have 24/7 supervision, psych consult called for capacity. Patient was in independent living facility prior to admission.    Falls frequently -Resident of skilled nursing facility -Uses rolling walker at baseline -PT  evaluation -> SNF  Code Status: Full code  Family Communication: Discussed in detail with the patient, all imaging results, lab results explained to the patient   Disposition Plan: Pending psych evaluation  Time Spent in minutes   15 minutes  Procedures  None   Consults   cardiology  DVT Prophylaxis  Lovenox   Medications  Scheduled Meds: . antiseptic oral rinse  7 mL Mouth Rinse BID  . buPROPion  300 mg Oral Daily  . calcium carbonate  1 tablet Oral Q breakfast  .  cefTRIAXone (ROCEPHIN)  IV  1 g Intravenous Q24H  . enoxaparin (LOVENOX) injection  40 mg Subcutaneous Q24H  . furosemide  40 mg Oral BID  . iron polysaccharides  150 mg Oral Daily  . levothyroxine  88 mcg Oral QAC breakfast  . pantoprazole  40 mg Oral Daily  . potassium chloride  20 mEq Oral Daily  . simvastatin  20 mg Oral QPM  . sodium chloride flush  3 mL Intravenous Q12H   Continuous Infusions:  PRN Meds:.sodium chloride, acetaminophen, ondansetron (ZOFRAN) IV, polyethylene glycol powder, sodium chloride flush   Antibiotics   Anti-infectives    Start     Dose/Rate Route Frequency Ordered Stop   04/24/15 1500  cefTRIAXone (ROCEPHIN) 1 g in dextrose 5 % 50 mL IVPB     1 g 100 mL/hr over 30 Minutes Intravenous Every 24 hours 04/23/15 1750     04/23/15 1500  cefTRIAXone (ROCEPHIN) 1 g in dextrose 5 % 50 mL IVPB     1 g 100 mL/hr over 30 Minutes Intravenous  Once 04/23/15 1454 04/23/15 1537        Subjective:   Dana [name] was seen and examined today. Denied any complaints.  Patient denies dizziness, chest pain, shortness of breath, abdominal pain, N/V/D/C, new weakness, numbess, tingling. No acute events overnight.    Objective:   Blood pressure 100/40, pulse 90, temperature 97.8 F (36.6 C), temperature source Oral, resp. rate 18, height 5\' 5"  (1.651 m), weight 61.553 kg (135 lb 11.2 oz), SpO2 99 %.  Wt Readings from Last 3 Encounters:  04/28/15 61.553 kg (135 lb 11.2 oz)  03/04/15 69.128 kg (152 lb 6.4 oz)     Intake/Output Summary (Last 24 hours) at 04/28/15 1202 Last data filed at 04/28/15 0600  Gross per 24 hour  Intake    462 ml  Output    800 ml  Net   -338 ml    Exam  General: Alert and oriented x 2, NAD  HEENT:  PERRLA, EOMI  Neck: Supple, no JVD, no masses  CVS: S1 S2 auscultated, no rubs, 2/6 SEM  Respiratory: Bibasilar crackles  Abdomen: Soft, NT, ND, NBS  Ext: no cyanosis clubbing or edema  Neuro: no new deficits  Data Review    Micro Results Recent Results (from the past 240 hour(s))  Urine culture     Status: None (Preliminary result)   Collection Time: 04/23/15  1:50 PM  Result Value Ref Range Status   Specimen Description URINE, CLEAN CATCH  Final   Special Requests NONE  Final   Culture   Final    >=100,000 COLONIES/mL ENTEROCOCCUS SPECIES SENDING TO REFERENCE LAB FOR SUSCEPTIBILITY TESTING RESULT CALLED TO, READ BACK BY AND VERIFIED WITH: P GERHARD,RN AT 1015 04/26/15 BY L BENFIELD    Report Status PENDING  Incomplete  Respiratory virus panel     Status: None   Collection Time: 04/23/15  6:40 PM  Result Value  Ref Range Status   Respiratory Syncytial Virus A Negative Negative Final   Respiratory Syncytial Virus B Negative Negative Final   Influenza A Negative Negative Final   Influenza B Negative Negative Final   Parainfluenza 1 Negative Negative Final   Parainfluenza 2 Negative Negative Final   Parainfluenza 3 Negative Negative Final   Metapneumovirus Negative Negative Final   Rhinovirus Negative Negative Final   Adenovirus Negative Negative Final    Comment: (NOTE) Performed At: [facility] [place], [place] [id number] Lindon Romp MD [id number]   MRSA PCR Screening     Status: None   Collection Time: 04/23/15  6:40 PM  Result Value Ref Range Status   MRSA by PCR NEGATIVE NEGATIVE Final    Comment:        The GeneXpert MRSA Assay (FDA approved for NASAL specimens only), is one component of a comprehensive MRSA colonization surveillance program. It is not intended to diagnose MRSA infection nor to guide or monitor treatment for MRSA infections.     Radiology Reports Dg Chest 2 View  04/23/2015  CLINICAL DATA:  Worsening shortness of Breath EXAM: CHEST  2 VIEW COMPARISON:  03/04/2015 FINDINGS: Cardiomediastinal silhouette is stable. Tortuous ascending aorta with atherosclerotic calcifications are noted. Hiatal hernia again noted. Again noted bilateral  interstitial prominence suspicious for interstitial edema or pneumonitis. There is trace left pleural effusion with left basilar atelectasis or infiltrate. IMPRESSION: Hiatal hernia again noted. Again noted bilateral interstitial prominence suspicious for interstitial edema or pneumonitis. There is trace left pleural effusion with left basilar atelectasis or infiltrate. Electronically Signed   By: Lahoma Crocker M.D.   On: 04/23/2015 12:57   Dg Chest Port 1 View  04/24/2015  CLINICAL DATA:  CHF EXAM: PORTABLE CHEST 1 VIEW COMPARISON:  04/23/2015 FINDINGS: Cardiac shadow is stable. Aortic calcifications are again identified. Changes consistent with congestive failure are again noted. No focal confluent infiltrate is seen. No sizable effusion is noted. No bony abnormality is seen. IMPRESSION: Stable changes consistent with CHF. Electronically Signed   By: Inez Catalina M.D.   On: 04/24/2015 07:40    CBC  Recent Labs Lab 04/23/15 1325  WBC 12.8*  HGB 11.2*  HCT 34.8*  PLT 300  MCV 95.9  MCH 30.9  MCHC 32.2  RDW 15.2    Chemistries   Recent Labs Lab 04/23/15 1325 04/24/15 0357 04/25/15 0339 04/26/15 0524 04/28/15 0330  NA 143 144 141 139 139  K 3.1* 3.8 4.5 4.4 4.3  CL 101 102 99* 96* 96*  CO2 30 34* 30 34* 29  GLUCOSE 118* 104* 136* 124* 108*  BUN 11 10 16  21* 21*  CREATININE 0.76 0.79 0.88 1.04* 0.92  CALCIUM 8.7* 8.2* 8.7* 9.1 9.3   ------------------------------------------------------------------------------------------------------------------ estimated creatinine clearance is 40.2 mL/min (by C-G formula based on Cr of 0.92). ------------------------------------------------------------------------------------------------------------------ No results for input(s): HGBA1C in the last 72 hours. ------------------------------------------------------------------------------------------------------------------ No results for input(s): CHOL, HDL, LDLCALC, TRIG, CHOLHDL, LDLDIRECT in  the last 72 hours. ------------------------------------------------------------------------------------------------------------------ No results for input(s): TSH, T4TOTAL, T3FREE, THYROIDAB in the last 72 hours.  Invalid input(s): FREET3 ------------------------------------------------------------------------------------------------------------------ No results for input(s): VITAMINB12, FOLATE, FERRITIN, TIBC, IRON, RETICCTPCT in the last 72 hours.  Coagulation profile No results for input(s): INR, PROTIME in the last 168 hours.  No results for input(s): DDIMER in the last 72 hours.  Cardiac Enzymes  Recent Labs Lab 04/23/15 1708 04/23/15 2207 04/24/15 0357  TROPONINI 0.05* 0.04* 0.04*   ------------------------------------------------------------------------------------------------------------------ Invalid input(s): POCBNP  No results for input(s): GLUCAP in the last 72 hours.   RAI,RIPUDEEP M.D. [facility] 04/28/2015, 12:02 PM  Pager: [phone] Between 7am to 7pm - call Pager - [phone]  After 7pm go to [email or web address] - password [email or web address]  Call night coverage person covering after 7pm

## 2015-04-29 LAB — BASIC METABOLIC PANEL
Anion gap: 16 — ABNORMAL HIGH (ref 5–15)
BUN: 21 mg/dL — ABNORMAL HIGH (ref 6–20)
CALCIUM: 10 mg/dL (ref 8.9–10.3)
CO2: 27 mmol/L (ref 22–32)
CREATININE: 1.03 mg/dL — AB (ref 0.44–1.00)
Chloride: 95 mmol/L — ABNORMAL LOW (ref 101–111)
GFR calc non Af Amer: 48 mL/min — ABNORMAL LOW (ref >60)
GFR, EST AFRICAN AMERICAN: 56 mL/min — AB (ref >60)
Glucose, Bld: 114 mg/dL — ABNORMAL HIGH (ref 65–99)
Potassium: 4.6 mmol/L (ref 3.5–5.1)
SODIUM: 138 mmol/L (ref 135–145)

## 2015-04-29 MED ORDER — FUROSEMIDE 40 MG PO TABS: 40.0000 mg | ORAL_TABLET | Freq: Every day | ORAL | Status: DC

## 2015-04-29 NOTE — Consult Note (Signed)
   [facility] CM Inpatient Consult   04/29/2015  Dana [name] [birth year] [id number] Patient evaluated for community based chronic disease management services with [facility] as a benefit of patient's Medicare Insurance. Patient is declining to go to a skilled facility.  Patient wants to discharge home with home health care.  Spoke with inpatient RNCM regarding needs.  Met with patient at bedside to explain [facility] services. Patient states she is legally blind in her right eye and she has Macular degeneration in her left.  She verbalizes her desires to remain independent. She states she makes her own decisions as she has a son and a daughter who are her support people.   She endorses her primary care provider to be Dr. Carol Ada.  Consent form signed.   Patient will receive post hospital discharge call and will be evaluated for monthly home visits for assessments and disease process education.  Left contact information and [facility] literature at bedside. Made Inpatient Case Manager aware that [facility] following. Of note, [facility] Management services does not replace or interfere with any services that are arranged by inpatient case management or social work.  For additional questions or referrals please contact:   Natividad Brood, RN BSN [facility] Liaison  [phone] business mobile phone Toll free office [phone]

## 2015-04-29 NOTE — Progress Notes (Signed)
At 1515 introduced self to pt as incoming nurse 330p-7p.  SW at bedside at this time instructing her d/c today.  Pt verbalized understanding.  Instructed to call for assistance as needed.  Verbalized understanding.  Will continue to monitor.  Karie Kirks, Therapist, sports.

## 2015-04-29 NOTE — Progress Notes (Signed)
Patient will discharge to [facility]- confirmed pt has key with her Anticipated discharge date: 2/8 Family notified: [name] (son) Transportation by [employer]- arriving around 5:15-5:30pm- will call RN when close- son to call for payment  CSW signing off.  [name], [place] Social Worker [phone]

## 2015-04-29 NOTE — Care Management Important Message (Signed)
Important Message  Patient Details  Name: Dana [name] MRN: [id number] Date of Birth: [birth year]   Medicare Important Message Given:  Yes    [name] 04/29/2015, 1:39 PMImportant Message  Patient Details  Name: Dana [name] MRN: [id number] Date of Birth: 12/25/1927   Medicare Important Message Given:  Yes    [name] G 04/29/2015, 1:38 PM

## 2015-04-29 NOTE — Progress Notes (Signed)
At 1740 pt d/c off floor via well chair to awaiting transport.  Raquel Sarna staff from [employer] transporting pt.  Karie Kirks, Therapist, sports.

## 2015-04-29 NOTE — Progress Notes (Signed)
Received message that patient will be returning to [facility] as prior to admission, she was receiving HH through [facility] and receiving private nursing care through [facility] and want to continue the same services. Orders for resumption of services faxed to [facility] ( they cannot do HHRN only hhpt/ ot). TCT patient's daughter [name], she is in agreement for the plan. Mindi Slicker [facility] [phone]

## 2015-04-29 NOTE — Progress Notes (Signed)
Discharge instructions reviewed with patient. Patient verbalizes understanding. Discharge instructions handout given to patient.

## 2015-04-29 NOTE — Discharge Summary (Addendum)
Physician Discharge Summary   Patient ID: Dana [name] MRN: [id number] DOB/AGE: [birth year] 82 y.o.  Admit date: 04/23/2015 Discharge date: 04/29/2015  Primary Care Physician:  Dana Naas, MD  Discharge Diagnoses:    . Acute respiratory failure with hypoxia (HCC) improved  . Acute combined systolic and diastolic CHF, NYHA class 1 ([place]) . Hypothyroidism . Chronic back pain . Anxiety . Dementia . Acute hypokalemia . Leukocytosis . QT prolongation   Enterococcus UTI, likely colonization    Consults:   Infectious disease, Dr. Graylon [name] via phone consultation Psychiatry, Dr Dana [name] Cardiology, Dr. Angelena [name]  Recommendations for Outpatient Follow-up:  1. Continue physical therapy for strength and endurance 2. Please repeat CBC/BMET at next visit 3. Please note patient refused to go to skilled nursing facility. Psychiatry was consulted and patient was deemed as having no capacity to make decisions. Patient continued to adamantly refuse skilled nursing facility and asked to be discharged back to her own facility with home health PT. This was relayed to patient's son,Dana [name], HPOA, who requested patient be discharged to ALF with home health as she will be more happy and cooperative there.   DIET: Heart healthy diet    Allergies:   Allergies  Allergen Reactions  . Amoxicillin Other (See Comments)    Excessive sweating   . Amoxicillin-Pot Clavulanate Other (See Comments)  . Celexa [Citalopram Hydrobromide]     unknown  . Iohexol      Desc: will need premedicated   . Pollen Extract   . Prozac [Fluoxetine Hcl]     unknown  . Promethazine Other (See Comments)    Restlessness    . Shrimp [Shellfish Allergy] Itching     DISCHARGE MEDICATIONS: Current Discharge Medication List    CONTINUE these medications which have CHANGED   Details  furosemide (LASIX) 40 MG tablet Take 1 tablet (40 mg total) by mouth daily. Qty: 30 tablet, Refills: 0      CONTINUE these  medications which have NOT CHANGED   Details  buPROPion (WELLBUTRIN XL) 150 MG 24 hr tablet Take 300 mg by mouth daily.     calcium citrate (CALCITRATE - DOSED IN MG ELEMENTAL CALCIUM) 950 MG tablet Take 200 mg of elemental calcium by mouth daily.    iron polysaccharides (NIFEREX) 150 MG capsule Take 150 mg by mouth daily.    levothyroxine (SYNTHROID) 88 MCG tablet Take 88 mcg by mouth daily before breakfast.    omeprazole (PRILOSEC) 40 MG capsule Take 40 mg by mouth daily.    polyethylene glycol powder (MIRALAX) powder Take 1 Container by mouth daily as needed for mild constipation.     simvastatin (ZOCOR) 20 MG tablet Take 20 mg by mouth daily.    potassium chloride (K-DUR) 10 MEQ tablet Take 1 tablet (10 mEq total) by mouth daily. With Lasix. Discontinue if Lasix is to be discontinued Qty: 7 tablet      STOP taking these medications     nystatin (MYCOSTATIN) 100000 UNIT/ML suspension      cefUROXime (CEFTIN) 500 MG tablet      diazepam (VALIUM) 5 MG tablet      hydrOXYzine (ATARAX/VISTARIL) 10 MG tablet          Brief H and P: For complete details please refer to admission H and P, but in brief  This is an 82 year old female patient discharged December 2016 after admission for HCAP and combined systolic and diastolic heart failure. Heart failure was a new finding on echocardiogram during that admission. She was  evaluated by cardiology. Medical management was recommended.  Patient also has pulmonary hypertension, severe aortic stenosis, was sent to ER due to abrupt onset of dyspnea, O2 sats were 88% with dry cough.  Because of borderline low normal blood pressure cardiology recommended not adding an ACE inhibitor or beta blocker at time of previous discharge and recommended to continue monitoring with the possibility of adding ACE inhibitor or carvedilol if blood pressure could tolerate. She did receive Lasix 20 mg daily for 7 days then recommended prn dosing schedule.  Patient was to follow-up with cardiology 1 week after discharge.   Hospital Course:   Acute respiratory failure with hypoxia: Due to Acute combined systolic and diastolic congestive heart failure / Severe aortic stenosis / Pulmonary hypertension  -Chest x-ray and clinical presentation was consistent with heart failure exacerbation (acute hypoxemia, elevated BNP and abnormal chest x-ray)-no lower extremity edema and admission weight unknown -Patient was placed on IV Lasix for diuresis. Negative balance of 2.7 L, weight down from 152 -> 135lbs , transitioned to oral Lasix - Echo 02/2015 EF 40-45% and grade 1 diastolic dysfunction in setting of severe aortic stenosis with associated pulmonary hypertension - Cardiology was consulted, per Dr. Angelena [name] recommended no further cardiac workup, not a candidate for TAVR or AVR given her advanced dementia. Patient can follow up with Dr. Irish [name]. -Chest x-ray showed no infiltrates. Influenza panel negative.   Active Problems:  Acute hypokalemia -Resolved   Hypothyroidism -Continue preadmission Synthroid  Enterococcus Urinary tract infection likely colonization - Patient received IV Rocephin for 6 days. Pharmacy called the micro-lab, enterococcus is resistant to ampicillin, nitrofurantoin, Levaquin and vancomycin. Final sensitivities are still pending. Discussed with infectious disease, Dr. Graylon [name], recommended to stop IV Rocephin and no need to treat further. No need for fosfomycin either. Patient has no fevers, chills or dysuria. -Given prolonged QT avoid floraquinolones   Dry mouth -Likely related to recent thrush although could be related to other medications   QT prolongation -Slightly longer then on December EKG which was 499 ms and has increased since 2012 and 2015 EKGs  HLD -Continue Zocor   Chronic back pain  -Not on chronic medications   Anxiety/ ? Dementia -Monitor for acute delirium during hospitalization -Continue  Wellbutrin -Psychiatry consultation was obtained and pertinent evaluation does not have capacity to make decisions at this time. Please see above, patient refused SNF   Falls frequently -Resident of skilled nursing facility -Uses rolling walker at baseline -PT evaluation -> SNF   Day of Discharge BP 106/49 mmHg  Pulse 97  Temp(Src) 97.6 F (36.4 C) (Oral)  Resp 20  Ht 5\' 5"  (1.651 m)  Wt 61.508 kg (135 lb 9.6 oz)  BMI 22.57 kg/m2  SpO2 99%  Physical Exam: General: Alert and awake oriented  not in any acute distress. HEENT: anicteric sclera, pupils reactive to light and accommodation CVS: S1-S2 clear no murmur rubs or gallops Chest: clear to auscultation bilaterally, no wheezing rales or rhonchi Abdomen: soft nontender, nondistended, normal bowel sounds Extremities: no cyanosis, clubbing or edema noted bilaterally Neuro: Cranial nerves II-XII intact, no focal neurological deficits   The results of significant diagnostics from this hospitalization (including imaging, microbiology, ancillary and laboratory) are listed below for reference.    LAB RESULTS: Basic Metabolic Panel:  Recent Labs Lab 04/28/15 0330 04/29/15 0539  NA 139 138  K 4.3 4.6  CL 96* 95*  CO2 29 27  GLUCOSE 108* 114*  BUN 21* 21*  CREATININE 0.92 1.03*  CALCIUM  9.3 10.0   Liver Function Tests: No results for input(s): AST, ALT, ALKPHOS, BILITOT, PROT, ALBUMIN in the last 168 hours. No results for input(s): LIPASE, AMYLASE in the last 168 hours. No results for input(s): AMMONIA in the last 168 hours. CBC:  Recent Labs Lab 04/23/15 1325  WBC 12.8*  HGB 11.2*  HCT 34.8*  MCV 95.9  PLT 300   Cardiac Enzymes:  Recent Labs Lab 04/23/15 2207 04/24/15 0357  TROPONINI 0.04* 0.04*   BNP: Invalid input(s): POCBNP CBG: No results for input(s): GLUCAP in the last 168 hours.  Significant Diagnostic Studies:  Dg Chest 2 View  04/23/2015  CLINICAL DATA:  Worsening shortness of Breath EXAM:  CHEST  2 VIEW COMPARISON:  03/04/2015 FINDINGS: Cardiomediastinal silhouette is stable. Tortuous ascending aorta with atherosclerotic calcifications are noted. Hiatal hernia again noted. Again noted bilateral interstitial prominence suspicious for interstitial edema or pneumonitis. There is trace left pleural effusion with left basilar atelectasis or infiltrate. IMPRESSION: Hiatal hernia again noted. Again noted bilateral interstitial prominence suspicious for interstitial edema or pneumonitis. There is trace left pleural effusion with left basilar atelectasis or infiltrate. Electronically Signed   By: Lahoma Crocker M.D.   On: 04/23/2015 12:57   Dg Chest Port 1 View  04/24/2015  CLINICAL DATA:  CHF EXAM: PORTABLE CHEST 1 VIEW COMPARISON:  04/23/2015 FINDINGS: Cardiac shadow is stable. Aortic calcifications are again identified. Changes consistent with congestive failure are again noted. No focal confluent infiltrate is seen. No sizable effusion is noted. No bony abnormality is seen. IMPRESSION: Stable changes consistent with CHF. Electronically Signed   By: Inez Catalina M.D.   On: 04/24/2015 07:40    2D ECHO: 02/28/15 Study Conclusions  - Left ventricle: The cavity size was normal. Wall thickness was normal. Systolic function was mildly to moderately reduced. The estimated ejection fraction was in the range of 40% to 45%. Diffuse hypokinesis. Doppler parameters are consistent with abnormal left ventricular relaxation (grade 1 diastolic dysfunction). - Aortic valve: Moderately calcified annulus. Trileaflet; severely calcified leaflets. Cusp separation was reduced. There was severe stenosis. There was moderate regurgitation. Mean gradient (S): 39 mm Hg. Peak gradient (S): 72 mm Hg. VTI ratio of LVOT to aortic valve: 0.16. Valve area (VTI): 0.37 cm^2. Valve area (Vmax): 0.43 cm^2. - Mitral valve: Calcified annulus. There was trivial regurgitation. - Left atrium: The atrium was at  the upper limits of normal in size. - Right atrium: Central venous pressure (est): 3 mm Hg. - Atrial septum: No defect or patent foramen ovale was identified. - Tricuspid valve: There was mild regurgitation. - Pulmonary arteries: PA peak pressure: 43 mm Hg (S). - Pericardium, extracardiac: There was no pericardial effusion.   Disposition and Follow-up: Discharge Instructions    (HEART FAILURE PATIENTS) Call MD:  Anytime you have any of the following symptoms: 1) 3 pound weight gain in 24 hours or 5 pounds in 1 week 2) shortness of breath, with or without a dry hacking cough 3) swelling in the hands, feet or stomach 4) if you have to sleep on extra pillows at night in order to breathe.    Complete by:  As directed      Diet - low sodium heart healthy    Complete by:  As directed      Increase activity slowly    Complete by:  As directed             DISPOSITION: ALF with home health PT, OT, RN   DISCHARGE FOLLOW-UP  Follow-up Information    Follow up with Dana Naas, MD. Schedule an appointment as soon as possible for a visit in 10 days.   Specialty:  Family Medicine   Why:  for hospital follow-up, obtain labs BMET   Contact information:   [place] [address] [phone]       Follow up with Jettie Booze., MD. Schedule an appointment as soon as possible for a visit in 2 weeks.   Specialties:  Cardiology, Radiology, Interventional Cardiology   Why:  for hospital follow-up   Contact information:   [place] [place] [address] [phone]        Time spent on Discharge: 35 minutes  Signed:   Laveah Gloster M.D. [facility] 04/29/2015, 10:55 AM Pager: [phone]

## 2015-04-29 NOTE — Progress Notes (Signed)
CSW identified pt son Dana [name] as primary decision maker at this time- other family members are not willing to work that closely with the patient.  Dana [name] was in agreement that they would prefer for patient to go to SNF but when CSW spoke with patient she continued to be adamant about not going and was yelling during interview that she was unwilling to go anywhere but back to [facility] with home health PT.  Patient seemed clear during interview and could easily relay what agencies provide home health services at her facility and discussed her medical issues in great detail.  CSW relayed this information to the son who agrees that patient will be happier and more cooperative at home with home health services and he thinks home is acceptable as long as medical staff are in agreement and patient is told that recommendation is that RN comes in once a day to check on medications and oxygen needs.  Pt is agreeable to having RN come to house everyday.  MD informed and agreeable to plan, RNCM informed to help restart home health services  CSW will follow for possible transport needs  Dana [name], [place] Social Worker [phone]

## 2015-04-30 ENCOUNTER — Other Ambulatory Visit: Payer: Self-pay

## 2015-04-30 ENCOUNTER — Other Ambulatory Visit: Payer: Self-pay | Admitting: *Deleted

## 2015-04-30 LAB — URINE CULTURE

## 2015-04-30 NOTE — Patient Outreach (Signed)
[facility]) Care Management  04/30/2015  KASIDY POGANY 04/01/1927 [id number]  Assessment: Transition of care- week 1 Referral from hospital liaison Eliott Nine) for transition of care calls and assess for home visits. 82 year old female with recent hospitalization on 04/23/15- 04/29/15 for shortness of breath (congestive heart failure exacerbation). Patient is reported to be hard of hearing and legally blind.  Patient was recommended to discharge to skilled nursing facility but she adamantly refused.  Psychiatry consult deemed patient having NO capacity to make decisions. Son- [name] (Healthcare power of attorney) was made aware but he requested to discharge patient to [facility]) with home health.   Call placed and spoke with patient. She is very hard of hearing and could hardly understand care Nurse, children's. She had mentioned that home health services from [facility] will be used. She verbalized that she will still need to call to set-up an appointment with primary care provider and cardiologist. Patient asked care management coordinator to call back since she will try to figure out how to operate and fix her phone to be able to hear better. Care management coordinator agreed with patient.   ADDENDUM: Called patient back twice but unable to reach her. HIPPA compliant voice messages left with name and contact information.   Plan: Will await for return call. If unable to receive a return call, will schedule for next outreach call.   Blanca Carreon A. Lachelle Rissler, BSN, RN-BC [place] Management Coordinator Cell: [phone]

## 2015-05-05 ENCOUNTER — Other Ambulatory Visit: Payer: Self-pay | Admitting: *Deleted

## 2015-05-05 NOTE — Patient Outreach (Signed)
[facility]) Care Management  05/05/2015  TALITHA TARTE 12/28/1927 [id number]   Assessment: Transition of care- week 2 Unable to receive any call back from patient to messages left on her voicemail. Call placed to follow-up transition of care but unable to reach patient. HIPAA compliant voice message left with name and contact number.  Plan: Will attempt to reach emergency contact if unable to hear from patient.    Call placed to patient's emergency contact/ son Laverna Peace) but unable to reach him. HIPAA compliant voice message left with name and contact information.   Plan: Will await for a return call. If unable to receive a call back, will schedule for next outreach call.  Anjelita Sheahan A. [place] Stell, BSN, RN-BC [place] Management Coordinator Cell: [phone]

## 2015-05-11 ENCOUNTER — Other Ambulatory Visit: Payer: Self-pay | Admitting: *Deleted

## 2015-05-11 NOTE — Patient Outreach (Signed)
[place] [facility]) Care Management  05/11/2015  Dana [name] [birth year] [id number]   Assessment: Transition of care follow-up call- week 3 Unable to receive a call back from patient to messages left on her voice mailbox. Unable to receive any call back from son to message left on his voice mailbox as well. Call placed to patient again but no answer, HIPAA compliant voice message left with name and contact information. Call placed to son but unable to reach him, HIPAA complaint voice message left with name and contact number. Call also placed to patient's daughter but unable to reach her, HIPAA compliant voice message left with name and contact information.  Plan: Will await for return call from patient, son or daughter. If unable to receive a call back, will schedule for next outreach call.  Vere Diantonio A. Brenton Joines, BSN, RN-BC [place] Management Coordinator Cell: [phone]

## 2015-05-13 ENCOUNTER — Other Ambulatory Visit: Payer: Self-pay | Admitting: *Deleted

## 2015-05-13 NOTE — Patient Outreach (Signed)
Dana [name]) Care Management  05/13/2015  Dana [name] 20-Jun-1927 [id number]   Assessment: Transition of care follow-up call- week 3 Unable to receive any response from patient or son to messages left on their voice mailboxes. Call placed to speak with patient but unable to reach her. HIPAA compliant voice message left with name and contact number.  Received inbound call from patient's daughter Dana [name]) who left a message acknowledging receipt of care management coordinator's message that was left on her voicemail  Call placed to speak with daughter Dana [name]) but unable to reach her. HIPAA compliant voice message left with name and contact information.   Plan: Will await for return call.  If unable to receive a call back, will schedule for next outreach call.    Addendum: Call placed to patient's son Dana [name]) and was able to speak with him.  Care management coordinator introduced self and explained the purpose of the call as well as patient's eligibility to the program. Patient's son notified care management coordinator that patient can be hesitant to deal with people that she has not  established "trust" with. According to son, patient's primary care provider (Dr. Carol Ada) is one of the few that patient trusts. He would like to get input from primary care provider if this [facility] program or services is suited for patient considering patient's reaction or behavior towards other people whom she has not established trust with. Son states that patient can not only be hesitant or resistant but "she can be a b_____",  as verbalized. He also stated that "trying to provide services which she is not receptive with- can be a waste of time and will not be successful".  Patient's son mentioned to care management coordinator that patient is going for a follow-up visit with primary care provider today or tomorrow. Encouraged son or whoever will accompany patient for her doctor's  appointment to discuss with provider about [facility] program/ services- if it would be appropriate for patient and to notify care management coordinator as to what decision was made about it. Son agreed and states that he will let patient's daughter be aware of it as well.     Plan: Will await for son's call to notify care management coordinator of what decision was made about [facility] program/ services. Will coordinate with primary care provider or nurse regarding this issue.    Milta Croson A. Tawnie Ehresman, BSN, RN-BC [place] Management Coordinator Cell: [phone]

## 2015-05-15 ENCOUNTER — Inpatient Hospital Stay (HOSPITAL_COMMUNITY)
Admission: EM | Admit: 2015-05-15 | Discharge: 2015-05-19 | DRG: 871 | Disposition: A | Discharge status: Home / Self Care | Payer: Medicare Other | Attending: Internal Medicine | Admitting: Internal Medicine

## 2015-05-15 ENCOUNTER — Emergency Department (HOSPITAL_COMMUNITY): Payer: Medicare Other

## 2015-05-15 DIAGNOSIS — Z9012 Acquired absence of left breast and nipple: Secondary | ICD-10-CM

## 2015-05-15 DIAGNOSIS — R059 Cough, unspecified: Secondary | ICD-10-CM

## 2015-05-15 DIAGNOSIS — J9601 Acute respiratory failure with hypoxia: Secondary | ICD-10-CM | POA: Diagnosis present

## 2015-05-15 DIAGNOSIS — Z880 Allergy status to penicillin: Secondary | ICD-10-CM | POA: Diagnosis not present

## 2015-05-15 DIAGNOSIS — Z853 Personal history of malignant neoplasm of breast: Secondary | ICD-10-CM | POA: Diagnosis not present

## 2015-05-15 DIAGNOSIS — J189 Pneumonia, unspecified organism: Secondary | ICD-10-CM | POA: Diagnosis present

## 2015-05-15 DIAGNOSIS — E785 Hyperlipidemia, unspecified: Secondary | ICD-10-CM | POA: Diagnosis present

## 2015-05-15 DIAGNOSIS — I272 Other secondary pulmonary hypertension: Secondary | ICD-10-CM | POA: Diagnosis present

## 2015-05-15 DIAGNOSIS — Z9841 Cataract extraction status, right eye: Secondary | ICD-10-CM

## 2015-05-15 DIAGNOSIS — T502X5A Adverse effect of carbonic-anhydrase inhibitors, benzothiadiazides and other diuretics, initial encounter: Secondary | ICD-10-CM | POA: Diagnosis present

## 2015-05-15 DIAGNOSIS — F411 Generalized anxiety disorder: Secondary | ICD-10-CM | POA: Diagnosis present

## 2015-05-15 DIAGNOSIS — D72829 Elevated white blood cell count, unspecified: Secondary | ICD-10-CM | POA: Diagnosis not present

## 2015-05-15 DIAGNOSIS — E876 Hypokalemia: Secondary | ICD-10-CM | POA: Diagnosis present

## 2015-05-15 DIAGNOSIS — R05 Cough: Secondary | ICD-10-CM

## 2015-05-15 DIAGNOSIS — A419 Sepsis, unspecified organism: Secondary | ICD-10-CM | POA: Diagnosis present

## 2015-05-15 DIAGNOSIS — R509 Fever, unspecified: Secondary | ICD-10-CM | POA: Diagnosis present

## 2015-05-15 DIAGNOSIS — F32A Depression, unspecified: Secondary | ICD-10-CM | POA: Diagnosis present

## 2015-05-15 DIAGNOSIS — I5021 Acute systolic (congestive) heart failure: Secondary | ICD-10-CM | POA: Diagnosis not present

## 2015-05-15 DIAGNOSIS — Y95 Nosocomial condition: Secondary | ICD-10-CM | POA: Diagnosis present

## 2015-05-15 DIAGNOSIS — Z90711 Acquired absence of uterus with remaining cervical stump: Secondary | ICD-10-CM

## 2015-05-15 DIAGNOSIS — Z888 Allergy status to other drugs, medicaments and biological substances status: Secondary | ICD-10-CM

## 2015-05-15 DIAGNOSIS — Z87891 Personal history of nicotine dependence: Secondary | ICD-10-CM

## 2015-05-15 DIAGNOSIS — I350 Nonrheumatic aortic (valve) stenosis: Secondary | ICD-10-CM | POA: Diagnosis not present

## 2015-05-15 DIAGNOSIS — F329 Major depressive disorder, single episode, unspecified: Secondary | ICD-10-CM | POA: Diagnosis present

## 2015-05-15 DIAGNOSIS — Z91013 Allergy to seafood: Secondary | ICD-10-CM

## 2015-05-15 DIAGNOSIS — I509 Heart failure, unspecified: Secondary | ICD-10-CM

## 2015-05-15 DIAGNOSIS — Z79899 Other long term (current) drug therapy: Secondary | ICD-10-CM

## 2015-05-15 DIAGNOSIS — K219 Gastro-esophageal reflux disease without esophagitis: Secondary | ICD-10-CM | POA: Diagnosis present

## 2015-05-15 DIAGNOSIS — F4323 Adjustment disorder with mixed anxiety and depressed mood: Secondary | ICD-10-CM | POA: Diagnosis present

## 2015-05-15 DIAGNOSIS — R0602 Shortness of breath: Secondary | ICD-10-CM | POA: Diagnosis not present

## 2015-05-15 DIAGNOSIS — E038 Other specified hypothyroidism: Secondary | ICD-10-CM | POA: Diagnosis present

## 2015-05-15 DIAGNOSIS — E039 Hypothyroidism, unspecified: Secondary | ICD-10-CM | POA: Diagnosis present

## 2015-05-15 DIAGNOSIS — Z961 Presence of intraocular lens: Secondary | ICD-10-CM | POA: Diagnosis present

## 2015-05-15 DIAGNOSIS — Z9842 Cataract extraction status, left eye: Secondary | ICD-10-CM | POA: Diagnosis not present

## 2015-05-15 DIAGNOSIS — I5042 Chronic combined systolic (congestive) and diastolic (congestive) heart failure: Secondary | ICD-10-CM | POA: Diagnosis present

## 2015-05-15 LAB — INFLUENZA PANEL BY PCR (TYPE A & B)
H1N1 flu by pcr: NOT DETECTED
Influenza A By PCR: NEGATIVE
Influenza B By PCR: NEGATIVE

## 2015-05-15 LAB — COMPREHENSIVE METABOLIC PANEL
ALBUMIN: 3.1 g/dL — AB (ref 3.5–5.0)
ALK PHOS: 60 U/L (ref 38–126)
ALT: 10 U/L — AB (ref 14–54)
AST: 17 U/L (ref 15–41)
Anion gap: 13 (ref 5–15)
BILIRUBIN TOTAL: 0.4 mg/dL (ref 0.3–1.2)
BUN: 12 mg/dL (ref 6–20)
CALCIUM: 9.1 mg/dL (ref 8.9–10.3)
CO2: 23 mmol/L (ref 22–32)
CREATININE: 0.88 mg/dL (ref 0.44–1.00)
Chloride: 105 mmol/L (ref 101–111)
GFR calc Af Amer: >60 mL/min (ref >60)
GFR, EST NON AFRICAN AMERICAN: 58 mL/min — AB (ref >60)
GLUCOSE: 141 mg/dL — AB (ref 65–99)
Potassium: 3.3 mmol/L — ABNORMAL LOW (ref 3.5–5.1)
Sodium: 141 mmol/L (ref 135–145)
TOTAL PROTEIN: 6.1 g/dL — AB (ref 6.5–8.1)

## 2015-05-15 LAB — CBC
HEMATOCRIT: 32.9 % — AB (ref 36.0–46.0)
HEMATOCRIT: 36.8 % (ref 36.0–46.0)
HEMOGLOBIN: 11.2 g/dL — AB (ref 12.0–15.0)
HEMOGLOBIN: 12.3 g/dL (ref 12.0–15.0)
MCH: 31 pg (ref 26.0–34.0)
MCH: 30.5 pg (ref 26.0–34.0)
MCHC: 34 g/dL (ref 30.0–36.0)
MCHC: 33.4 g/dL (ref 30.0–36.0)
MCV: 91.1 fL (ref 78.0–100.0)
MCV: 91.3 fL (ref 78.0–100.0)
Platelets: 280 10*3/uL (ref 150–400)
Platelets: 279 10*3/uL (ref 150–400)
RBC: 3.61 MIL/uL — ABNORMAL LOW (ref 3.87–5.11)
RBC: 4.03 MIL/uL (ref 3.87–5.11)
RDW: 14.4 % (ref 11.5–15.5)
RDW: 14.3 % (ref 11.5–15.5)
WBC: 16 10*3/uL — AB (ref 4.0–10.5)
WBC: 15.6 10*3/uL — ABNORMAL HIGH (ref 4.0–10.5)

## 2015-05-15 LAB — URINALYSIS, ROUTINE W REFLEX MICROSCOPIC
BILIRUBIN URINE: NEGATIVE
GLUCOSE, UA: NEGATIVE mg/dL
HGB URINE DIPSTICK: NEGATIVE
Ketones, ur: NEGATIVE mg/dL
Nitrite: NEGATIVE
PH: 7 (ref 5.0–8.0)
Protein, ur: NEGATIVE mg/dL
SPECIFIC GRAVITY, URINE: 1.007 (ref 1.005–1.030)

## 2015-05-15 LAB — BRAIN NATRIURETIC PEPTIDE: B NATRIURETIC PEPTIDE 5: 2294.6 pg/mL — AB (ref 0.0–100.0)

## 2015-05-15 LAB — I-STAT TROPONIN, ED: TROPONIN I, POC: 0.05 ng/mL (ref 0.00–0.08)

## 2015-05-15 LAB — I-STAT CHEM 8, ED
BUN: 14 mg/dL (ref 6–20)
CALCIUM ION: 1.13 mmol/L (ref 1.13–1.30)
Chloride: 101 mmol/L (ref 101–111)
Creatinine, Ser: 0.8 mg/dL (ref 0.44–1.00)
GLUCOSE: 140 mg/dL — AB (ref 65–99)
HCT: 36 % (ref 36.0–46.0)
Hemoglobin: 12.2 g/dL (ref 12.0–15.0)
Potassium: 3.3 mmol/L — ABNORMAL LOW (ref 3.5–5.1)
SODIUM: 142 mmol/L (ref 135–145)
TCO2: 26 mmol/L (ref 0–100)

## 2015-05-15 LAB — URINE MICROSCOPIC-ADD ON
RBC / HPF: NONE SEEN RBC/hpf (ref 0–5)
WBC UA: NONE SEEN WBC/hpf (ref 0–5)

## 2015-05-15 LAB — STREP PNEUMONIAE URINARY ANTIGEN: Strep Pneumo Urinary Antigen: NEGATIVE

## 2015-05-15 LAB — MRSA PCR SCREENING: MRSA BY PCR: NEGATIVE

## 2015-05-15 MED ORDER — FUROSEMIDE 10 MG/ML IJ SOLN
40.0000 mg | Freq: Every day | INTRAMUSCULAR | Status: DC
  Administered 2015-05-16 – 2015-05-19 (×4): 40 mg via INTRAVENOUS
  Filled 2015-05-15 (×4): qty 4

## 2015-05-15 MED ORDER — ONDANSETRON HCL 4 MG/2ML IJ SOLN: 4.0000 mg | Freq: Three times a day (TID) | INTRAMUSCULAR | Status: AC | PRN

## 2015-05-15 MED ORDER — FUROSEMIDE 10 MG/ML IJ SOLN: 40.0000 mg | Freq: Every day | INTRAMUSCULAR | Status: DC

## 2015-05-15 MED ORDER — DEXTROSE 5 % IV SOLN
1.0000 g | Freq: Two times a day (BID) | INTRAVENOUS | Status: DC
  Filled 2015-05-15 (×2): qty 1

## 2015-05-15 MED ORDER — LEVOFLOXACIN IN D5W 500 MG/100ML IV SOLN
500.0000 mg | Freq: Once | INTRAVENOUS | Status: AC
  Administered 2015-05-15: 500 mg via INTRAVENOUS
  Filled 2015-05-15: qty 100

## 2015-05-15 MED ORDER — SODIUM CHLORIDE 0.9% FLUSH: 3.0000 mL | INTRAVENOUS | Status: DC | PRN

## 2015-05-15 MED ORDER — VANCOMYCIN HCL IN DEXTROSE 1-5 GM/200ML-% IV SOLN: 1000.0000 mg | INTRAVENOUS | Status: DC

## 2015-05-15 MED ORDER — DEXTROSE 5 % IV SOLN
500.0000 mg | INTRAVENOUS | Status: DC
  Administered 2015-05-15: 500 mg via INTRAVENOUS
  Filled 2015-05-15: qty 500

## 2015-05-15 MED ORDER — NITROGLYCERIN IN D5W 200-5 MCG/ML-% IV SOLN
5.0000 ug/min | Freq: Once | INTRAVENOUS | Status: AC
  Administered 2015-05-15: 5 ug/min via INTRAVENOUS
  Filled 2015-05-15: qty 250

## 2015-05-15 MED ORDER — POTASSIUM CHLORIDE CRYS ER 20 MEQ PO TBCR
20.0000 meq | EXTENDED_RELEASE_TABLET | Freq: Once | ORAL | Status: AC
  Administered 2015-05-15: 20 meq via ORAL
  Filled 2015-05-15: qty 1

## 2015-05-15 MED ORDER — SODIUM CHLORIDE 0.9% FLUSH
3.0000 mL | Freq: Two times a day (BID) | INTRAVENOUS | Status: DC
  Administered 2015-05-15 – 2015-05-19 (×7): 3 mL via INTRAVENOUS

## 2015-05-15 MED ORDER — POLYSACCHARIDE IRON COMPLEX 150 MG PO CAPS
150.0000 mg | ORAL_CAPSULE | Freq: Every day | ORAL | Status: DC
  Administered 2015-05-16 – 2015-05-19 (×4): 150 mg via ORAL
  Filled 2015-05-15 (×5): qty 1

## 2015-05-15 MED ORDER — PANTOPRAZOLE SODIUM 40 MG PO TBEC
40.0000 mg | DELAYED_RELEASE_TABLET | Freq: Every day | ORAL | Status: DC
  Administered 2015-05-15 – 2015-05-19 (×5): 40 mg via ORAL
  Filled 2015-05-15 (×5): qty 1

## 2015-05-15 MED ORDER — FUROSEMIDE 10 MG/ML IJ SOLN
80.0000 mg | Freq: Once | INTRAMUSCULAR | Status: AC
  Administered 2015-05-15: 80 mg via INTRAVENOUS
  Filled 2015-05-15: qty 8

## 2015-05-15 MED ORDER — ENOXAPARIN SODIUM 40 MG/0.4ML ~~LOC~~ SOLN
40.0000 mg | SUBCUTANEOUS | Status: DC
  Administered 2015-05-15 – 2015-05-18 (×4): 40 mg via SUBCUTANEOUS
  Filled 2015-05-15 (×4): qty 0.4

## 2015-05-15 MED ORDER — VANCOMYCIN HCL IN DEXTROSE 1-5 GM/200ML-% IV SOLN
1000.0000 mg | INTRAVENOUS | Status: DC
  Administered 2015-05-15 – 2015-05-18 (×4): 1000 mg via INTRAVENOUS
  Filled 2015-05-15 (×6): qty 200

## 2015-05-15 MED ORDER — CEFTRIAXONE SODIUM 1 G IJ SOLR
1.0000 g | INTRAMUSCULAR | Status: DC
  Administered 2015-05-15: 1 g via INTRAVENOUS
  Filled 2015-05-15: qty 10

## 2015-05-15 MED ORDER — VANCOMYCIN HCL IN DEXTROSE 1-5 GM/200ML-% IV SOLN: 1000.0000 mg | Freq: Once | INTRAVENOUS | Status: DC

## 2015-05-15 MED ORDER — DEXTROSE 5 % IV SOLN
1.0000 g | Freq: Three times a day (TID) | INTRAVENOUS | Status: DC
  Administered 2015-05-15 – 2015-05-19 (×12): 1 g via INTRAVENOUS
  Filled 2015-05-15 (×19): qty 1

## 2015-05-15 MED ORDER — SIMVASTATIN 20 MG PO TABS
20.0000 mg | ORAL_TABLET | Freq: Every day | ORAL | Status: DC
  Administered 2015-05-15 – 2015-05-19 (×5): 20 mg via ORAL
  Filled 2015-05-15 (×5): qty 1

## 2015-05-15 MED ORDER — DIAZEPAM 5 MG PO TABS: 10.0000 mg | ORAL_TABLET | Freq: Four times a day (QID) | ORAL | Status: DC | PRN

## 2015-05-15 MED ORDER — SODIUM CHLORIDE 0.9 % IV SOLN: 250.0000 mL | INTRAVENOUS | Status: DC | PRN

## 2015-05-15 MED ORDER — GUAIFENESIN ER 600 MG PO TB12
600.0000 mg | ORAL_TABLET | Freq: Two times a day (BID) | ORAL | Status: DC
  Administered 2015-05-15 – 2015-05-19 (×8): 600 mg via ORAL
  Filled 2015-05-15 (×8): qty 1

## 2015-05-15 MED ORDER — LEVOTHYROXINE SODIUM 88 MCG PO TABS
88.0000 ug | ORAL_TABLET | Freq: Every day | ORAL | Status: DC
  Administered 2015-05-16 – 2015-05-19 (×4): 88 ug via ORAL
  Filled 2015-05-15 (×5): qty 1

## 2015-05-15 MED ORDER — DEXTROSE 5 % IV SOLN: 2.0000 g | Freq: Three times a day (TID) | INTRAVENOUS | Status: DC

## 2015-05-15 NOTE — H&P (Signed)
[facility] History and Physical  [name] YQM:[id number] DOB: 1928-02-20 DOA: 05/15/2015  Referring physician:  PCP: Reginia Naas, MD   Chief Complaint:  HPI: Dana [name] is a 82 y.o. female Recently hospitalized from 2/2- 04/29/2015 with acute respiratory failure with hypoxia due to acute combined systolic and diastolic CHF, complicated with enterococcus UTI, brought by ambulance from her  nursing home with worsening SOB, worse on ambulation. She had recently finished antibiotics course for PNA. She was supposed to have home O2 but had not received the tubing at the time of admission and family dynamics.  She denied any fevers, or chest pain. She denies any leg swelling. There were Dana changes in her medications. She may have had sick contacts 2 days prior. Patient has intermittent periods of agitation, at which time she does not wish to provide further information. On presentation, She was in obvious respiratory distress, and was febrile, with Tmax at 100. Osats were at 78 RA, increased to 98 on NRB.   She received IVF, Lasix, prn NTG and Levaquin IV  Review of Systems: This is limited, that the patient's chronic anxiety and dementia Constitutional:  Dana weight loss, night sweats, fevers, chills, +fatigue HEENT: Dana headaches, difficulty swallowing,tooth/dental problems, sore throat Cardio-vascular:  Dana chest pain, orthopnea, PND, swelling in lower extremities, anasarca, dizziness, palpitations  GI:  Dana heartburn, indigestion, abdominal pain, nausea, vomiting, diarrhea, change in bowel habits, loss of appetite  Respiratory:  +SOB at rest and on exertion.. Dana excess mucus, Dana productive cough, Dana non-productive cough, Dana coughing up of blood. Dana change in color of mucus. Dana wheezing .Dana chest wall deformity  Skin:  Dana rash or lesions.  GU:  Dana dysuria, change in color of urine, Dana urgency or frequency. Dana flank pain.  Musculoskeletal:   Dana joint pain or  swelling. Dana decreased range of motion. Dana back pain.  Psych:  Mood is chronically anxious, present at time of visit, becomes easily irritable + depression. +Dementia Neuro:  Dana change in sensation, unilateral strength   All other systems were reviewed and are negative.  Past Medical History  Diagnosis Date  . Hypothyroidism   . Esophageal reflux   . Seasonal allergies   . Hypercholesteremia   . Depression   . Anxiety   . History of diverticulitis of colon   . History of peptic ulcer   . Chronic back pain     "gets it worse as it goes down" (02/24/2015)  . Bladder prolapse, female, acquired   . Heart murmur   . History of electroconvulsive therapy 1956    "then had insulin shock; for deep deep depression"  . History of hiatal hernia   . Migraine     "might have one maybe once/month" (02/24/2015)  . Arthritis     "in my back" (02/24/2015)  . Breast cancer, left breast ([place]) 1970  . Non Hodgkin's lymphoma ([place]) 2001  . Basal cell carcinoma of nasal tip    Past Surgical History  Procedure Laterality Date  . Lumbar laminectomy  2012  . Mastectomy, radical Left 1970  . Back surgery    . Abdominal hysterectomy      "took out my uterus only"  . Cataract extraction w/ intraocular lens  implant, bilateral    . Breast biopsy Left 1970  . Bone marrow biopsy  2001    "for non-Hodgkin's lymphoma"   Social History:  reports that she has quit smoking. Her smoking use included Cigarettes. She  has a 5 pack-year smoking history. She has never used smokeless tobacco. She reports that she drinks about 0.6 oz of alcohol per week. She reports that she does not use illicit drugs.  Allergies  Allergen Reactions  . Amoxicillin Other (See Comments)    Excessive sweating   . Amoxicillin-Pot Clavulanate Other (See Comments)  . Celexa [Citalopram Hydrobromide]     unknown  . Iohexol      Desc: will need premedicated   . Pollen Extract   . Prozac [Fluoxetine Hcl]     unknown  .  Promethazine Other (See Comments)    Restlessness    . Shrimp [Shellfish Allergy] Itching    Family History  Problem Relation Age of Onset  . Diabetes Mother   . Heart disease Mother   . Heart attack Mother   . Cancer - Prostate Father      Prior to Admission medications   Medication Sig Start Date End Date Taking? Authorizing Provider  buPROPion (WELLBUTRIN XL) 150 MG 24 hr tablet Take 300 mg by mouth daily.    Yes Historical Provider, MD  calcium citrate (CALCITRATE - DOSED IN MG ELEMENTAL CALCIUM) 950 MG tablet Take 200 mg of elemental calcium by mouth daily.   Yes Historical Provider, MD  diazepam (VALIUM) 5 MG tablet Take 10 mg by mouth every 6 (six) hours as needed for anxiety.   Yes Historical Provider, MD  hydrOXYzine (ATARAX/VISTARIL) 10 MG tablet Take 10 mg by mouth 3 (three) times daily as needed.   Yes Historical Provider, MD  iron polysaccharides (NIFEREX) 150 MG capsule Take 150 mg by mouth daily.   Yes Historical Provider, MD  levothyroxine (SYNTHROID) 88 MCG tablet Take 88 mcg by mouth daily before breakfast.   Yes Historical Provider, MD  omeprazole (PRILOSEC) 40 MG capsule Take 40 mg by mouth daily.   Yes Historical Provider, MD  polyethylene glycol powder (MIRALAX) powder Take 1 Container by mouth daily as needed for mild constipation.    Yes Historical Provider, MD  simvastatin (ZOCOR) 20 MG tablet Take 20 mg by mouth daily.   Yes Historical Provider, MD  potassium chloride (K-DUR) 10 MEQ tablet Take 1 tablet (10 mEq total) by mouth daily. With Lasix. Discontinue if Lasix is to be discontinued Patient not taking: Reported on 04/23/2015 03/04/15   Caren Griffins, MD   Physical Exam: Filed Vitals:   05/15/15 1104 05/15/15 1115 05/15/15 1130 05/15/15 1138  BP: 140/74 135/88 124/68   Pulse: 109 109 103   Temp: 98.4 F (36.9 C)   100 F (37.8 C)  TempSrc: Oral   Rectal  Resp: 34 27 25   Height: 5' (1.524 m)     Weight: 64.411 kg (142 lb)     SpO2: 97% 95% 93%      Wt Readings from Last 3 Encounters:  05/15/15 64.411 kg (142 lb)  04/29/15 61.508 kg (135 lb 9.6 oz)  03/04/15 69.128 kg (152 lb 6.4 oz)    General: Appears uncomfortable, anxious Eyes:  PERRL, EOMI, normal lids, iris ENT: grossly normal hearing, lips & tongue Neck: Dana lymphadenopathy, masses or thyromegaly Cardiovascular: regular rate and rythm, 2/6 systolic murmurs, rubs or gallops. Dana lower extremity edema   Respiratory: bilateral rales with mild expiratory rhonchi and wheezing on the left lung, poor respiratory effort. Abdomen: soft,non-tender, normal bowel sounds Skin: Dana rash or induration seen on limited exam. Dana open lesions. Musculoskeletal:  grossly normal tone in both upper and lower extremities Psychiatric: anxius  but able to answer simple questions Neurologic: CN 2-12 grossly intact, moves all extremities in coordinated fashion.          Labs on Admission:  Basic Metabolic Panel:  Recent Labs Lab 05/15/15 1133 05/15/15 1148  NA 141 142  K 3.3* 3.3*  CL 105 101  CO2 23  --   GLUCOSE 141* 140*  BUN 12 14  CREATININE 0.88 0.80  CALCIUM 9.1  --     Liver Function Tests:  Recent Labs Lab 05/15/15 1133  AST 17  ALT 10*  ALKPHOS 60  BILITOT 0.4  PROT 6.1*  ALBUMIN 3.1*   Dana results for input(s): LIPASE, AMYLASE in the last 168 hours. Dana results for input(s): AMMONIA in the last 168 hours.  CBC:  Recent Labs Lab 05/15/15 1133 05/15/15 1148  WBC 16.0*  --   HGB 11.2* 12.2  HCT 32.9* 36.0  MCV 91.1  --   PLT 280  --     Cardiac Enzymes: Dana results for input(s): CKTOTAL, CKMB, CKMBINDEX, TROPONINI in the last 168 hours.  BNP (last 3 results)  Recent Labs  04/23/15 1325 04/24/15 0357 05/15/15 1133  BNP 2532.8* 2132.5* 2294.6*    ProBNP (last 3 results) Dana results for input(s): PROBNP in the last 8760 hours.   CREATININE: 0.8 (05/15/15 1148) Estimated creatinine clearance - 43.1 mL/min  CBG: Dana results for input(s): GLUCAP in  the last 168 hours.  Radiological Exams on Admission: Dg Chest Portable 1 View  05/15/2015  CLINICAL DATA:  Shortness of breath EXAM: PORTABLE CHEST 1 VIEW COMPARISON:  04/24/2015; 04/23/2015; 05/04/2014 ; 03/14/2014 FINDINGS: Grossly unchanged enlarged cardiac silhouette and mediastinal contours with atherosclerotic plaque within a tortuous thoracic aorta. There is persistent rightward deviation of the tracheal air column at the level of the aortic arch, potentially accentuated due to patient rotation. Worsening left basilar heterogeneous opacities. Pulmonary vascular remains indistinct with cephalization of flow, most conspicuous in the right upper and mid lung, potentially accentuated due to patient rotation. Trace pleural effusions are not excluded. Dana pneumothorax. Unchanged bones. IMPRESSION: Findings worrisome for residual / recurrent pulmonary edema with worsening left basilar opacities, atelectasis versus infiltrate. Further evaluation with a PA and lateral chest radiograph may be obtained as clinically indicated. Electronically Signed   By: Sandi Mariscal M.D.   On: 05/15/2015 12:02    EKG: pending   Assessment/Plan Principal Problem:   Acute respiratory failure with hypoxia (HCC) Active Problems:   HCAP (healthcare-associated pneumonia)   Leukocytosis   Adjustment disorder with mixed anxiety and depressed mood   CHF (congestive heart failure) ([place])  Acute respiratory failure due to Health Care Associated Pneumonia chest x-ray fndings worrisome for residual / recurrent pulmonary edema with worsening left basilar opacities, atelectasis versus infiltrate.  Admit to stepdown tele [place] currently on 4L  Blood cultures x 2, Differential, Legionella urine antigen, Strep Pneumo urine antigen  Repeat influenza panel IV antibiotics with Rocephin ans Azithromycin as discussed with Pharmacy Mucinex as needed  Flutter Valve Repeat CBC in am  Fever, likely due to infection Tmax 100F -Blood  cultures  Legionella urine antigen, Strep Pneumo urine antigen pending IVF and antipyretics IV antibiotics as above   History of combined chronic systolic and diastolic CHF/Severe AS ECHO in 02/2015  nl LVF 40-45%, gr 1 diast HF, mod MR and AR, pulm HTN CXR shows HF but improved compared to prior on 2/2. BNP elevated at 2294. EKG  Pending Repeat CXR in am -  Daily weights  and strict I/O -  Lasix  40 mg starting on 2/25  Leukocytosis, likely reactive Current value 16 Check blood cultures -  abx as above -  Repeat WBC in AM  Hypokalemia,  Due to diuretics current K 3.3 Continue to replenish  Hypothyroidism: Chronic.Last TSH was on 0.674 on 02/2015  -Continue home Synthroid  Hyperlipidemia Continue Zocor  Anxiety/Depression Continue Valium and Wellbutrin   Code Status:  Full Code DVT Prophylaxis: Lovenox Family Communication: Dana one at bedside Disposition Plan: Pending Improvement. Admitted for stepdown due to hypoxia, leukocytosis, fever.  Expected LOS 24-48 hrs    [facility] E,PA-C [facility] [email or web address] Password [email or web address]

## 2015-05-15 NOTE — ED Notes (Signed)
Patient denies pain and is resting comfortably.  

## 2015-05-15 NOTE — Progress Notes (Addendum)
Pharmacy Antibiotic Note LECHIA DELATOUR is a 82 y.o. female admitted 05/15/15 from NH with acute SOB and concern for pna. Pharmacy has been consulted for Cefepime and vancomycin dosing.  Plan: 1. Cefepime 1 gram Q 12 hours 2. Vancomycin 1 gram Q 24 hours 3. VT at [facility] if continued 4. Noted hx of VRE UTI last admission   Height: 5' (152.4 cm) Weight: 142 lb (64.411 kg) IBW/kg (Calculated) : 45.5  Temp (24hrs), Avg:99.2 F (37.3 C), Min:98.4 F (36.9 C), Max:100 F (37.8 C)   Recent Labs Lab 05/15/15 1133 05/15/15 1148  WBC 16.0*  --   CREATININE 0.88 0.80    Estimated Creatinine Clearance: 43.1 mL/min (by C-G formula based on Cr of 0.8).    Allergies  Allergen Reactions  . Amoxicillin Other (See Comments)    Excessive sweating   . Amoxicillin-Pot Clavulanate Other (See Comments)  . Celexa [Citalopram Hydrobromide]     unknown  . Iohexol      Desc: will need premedicated   . Pollen Extract   . Prozac [Fluoxetine Hcl]     unknown  . Promethazine Other (See Comments)    Restlessness    . Shrimp [Shellfish Allergy] Itching    Antimicrobials this admission: 2/24 Cefepime >>  2/24 Vancomycin >>  2/24 Levaquin x 1  Dose adjustments this admission: n/a  Microbiology results: 2/24 BCx: px 2/2 UCx: VRE   Thank you for allowing pharmacy to be a part of this patient's care.  Vincenza Hews, PharmD, BCPS 05/15/2015, 2:15 PM Pager: [phone]   Addendum: Patient had an allergic reaction to ceftriaxone so the previously ordered cefepime will be changed to aztreonam.  Plan: Aztreonam 1g IV q8  Nena Jordan, PharmD, BCPS 05/15/2015, 5:51 PM

## 2015-05-15 NOTE — ED Notes (Signed)
Pt here from [place] via [facility] for SOB.  Pt states she woke up feeling "sick, like I had the flu".  When she started walking she began feeling sob.  Recently tx with IV antibiotics for pnx.  O2 sats of 78% on RA upon their arrival.  Increased to 98% on NRB and remained 95% on 2 L.  Pt was supposed to be on home O2, but she had not received the O2 tubing.  Pt tachypneaic, unable to speak in complete sentences.

## 2015-05-15 NOTE — ED Notes (Signed)
Pt with redness and itching above iv site.  Pharmacy and MD notified.

## 2015-05-15 NOTE — ED Provider Notes (Signed)
CSN: [id number]     Arrival date & time 05/15/15  1055 History   First MD Initiated Contact with Patient 05/15/15 1057     Chief Complaint  Patient presents with  . Shortness of Breath     (Consider location/radiation/quality/duration/timing/severity/associated sxs/prior Treatment) HPI  The patient is an 82 year old female, she has a history of congestive heart failure, recently admitted at the beginning of February for fluid overload and exacerbation of congestive heart failure. During that admission it was noted that the patient had severe aortic stenosis as well as severe congestive heart failure that was requiring oxygen requirement, it was recommended that she go to a skilled nursing facility however the patient refused, the healthcare power of attorney was the son was brought into the discussion and requested assisted living with supportive oxygenation and in-home care. The patient was describing to me that she refused to have the oxygen tank where the people wanted to place and so they left and did not put her on the oxygen, but she has been without oxygen while she has been at home. She has had increased shortness of breath overnight, she is coughing, she is dyspneic but denies fevers or chest pain and really has no swelling of the legs. Otherwise she states she is taking her medications as prescribed  Past Medical History  Diagnosis Date  . Hypothyroidism   . Esophageal reflux   . Seasonal allergies   . Hypercholesteremia   . Depression   . Anxiety   . History of diverticulitis of colon   . History of peptic ulcer   . Chronic back pain     "gets it worse as it goes down" (02/24/2015)  . Bladder prolapse, female, acquired   . Heart murmur   . History of electroconvulsive therapy 1956    "then had insulin shock; for deep deep depression"  . History of hiatal hernia   . Migraine     "might have one maybe once/month" (02/24/2015)  . Arthritis     "in my back" (02/24/2015)  .  Breast cancer, left breast ([place]) 1970  . Non Hodgkin's lymphoma ([place]) 2001  . Basal cell carcinoma of nasal tip    Past Surgical History  Procedure Laterality Date  . Lumbar laminectomy  2012  . Mastectomy, radical Left 1970  . Back surgery    . Abdominal hysterectomy      "took out my uterus only"  . Cataract extraction w/ intraocular lens  implant, bilateral    . Breast biopsy Left 1970  . Bone marrow biopsy  2001    "for non-Hodgkin's lymphoma"   Family History  Problem Relation Age of Onset  . Diabetes Mother   . Heart disease Mother   . Heart attack Mother   . Cancer - Prostate Father    Social History  Substance Use Topics  . Smoking status: Former Smoker -- 1.00 packs/day for 5 years    Types: Cigarettes  . Smokeless tobacco: Never Used  . Alcohol Use: 0.6 oz/week    1 Glasses of wine per week   OB History    No data available     Review of Systems  All other systems reviewed and are negative.     Allergies  Amoxicillin; Amoxicillin-pot clavulanate; Azithromycin; Celexa; Iohexol; Pollen extract; Prozac; Levaquin; Promethazine; and Shrimp  Home Medications   Prior to Admission medications   Medication Sig Start Date End Date Taking? Authorizing Provider  buPROPion (WELLBUTRIN XL) 150 MG 24 hr tablet  Take 300 mg by mouth daily.    Yes Historical Provider, MD  calcium citrate (CALCITRATE - DOSED IN MG ELEMENTAL CALCIUM) 950 MG tablet Take 200 mg of elemental calcium by mouth daily.   Yes Historical Provider, MD  diazepam (VALIUM) 5 MG tablet Take 10 mg by mouth every 6 (six) hours as needed for anxiety.   Yes Historical Provider, MD  hydrOXYzine (ATARAX/VISTARIL) 10 MG tablet Take 10 mg by mouth 3 (three) times daily as needed.   Yes Historical Provider, MD  iron polysaccharides (NIFEREX) 150 MG capsule Take 150 mg by mouth daily.   Yes Historical Provider, MD  levothyroxine (SYNTHROID) 88 MCG tablet Take 88 mcg by mouth daily before breakfast.   Yes  Historical Provider, MD  omeprazole (PRILOSEC) 40 MG capsule Take 40 mg by mouth daily.   Yes Historical Provider, MD  polyethylene glycol powder (MIRALAX) powder Take 1 Container by mouth daily as needed for mild constipation.    Yes Historical Provider, MD  simvastatin (ZOCOR) 20 MG tablet Take 20 mg by mouth daily.   Yes Historical Provider, MD  potassium chloride (K-DUR) 10 MEQ tablet Take 1 tablet (10 mEq total) by mouth daily. With Lasix. Discontinue if Lasix is to be discontinued Patient not taking: Reported on 04/23/2015 03/04/15   Caren Griffins, MD   BP 125/63 mmHg  Pulse 100  Temp(Src) 97.5 F (36.4 C) (Oral)  Resp 20  Ht 5' (1.524 m)  Wt 138 lb 4.8 oz (62.732 kg)  BMI 27.01 kg/m2  SpO2 99% Physical Exam  Constitutional: She appears well-developed and well-nourished. She appears distressed.  HENT:  Head: Normocephalic and atraumatic.  Mouth/Throat: Oropharynx is clear and moist. No oropharyngeal exudate.  Eyes: Conjunctivae and EOM are normal. Pupils are equal, round, and reactive to light. Right eye exhibits no discharge. Left eye exhibits no discharge. No scleral icterus.  Neck: Normal range of motion. Neck supple. No JVD present. No thyromegaly present.  Cardiovascular: Regular rhythm, normal heart sounds and intact distal pulses.  Exam reveals no gallop and no friction rub.   No murmur heard. tachycardia  Pulmonary/Chest: She is in respiratory distress. She has no wheezes. She has rales.  Abdominal: Soft. Bowel sounds are normal. She exhibits no distension and no mass. There is no tenderness.  Musculoskeletal: Normal range of motion. She exhibits no edema or tenderness.  Lymphadenopathy:    She has no cervical adenopathy.  Neurological: She is alert. Coordination normal.  Skin: Skin is warm and dry. No rash noted. No erythema.  Psychiatric: She has a normal mood and affect. Her behavior is normal.  Nursing note and vitals reviewed.   ED Course  Procedures  (including critical care time) Labs Review Labs Reviewed  CBC - Abnormal; Notable for the following:    WBC 16.0 (*)    RBC 3.61 (*)    Hemoglobin 11.2 (*)    HCT 32.9 (*)    All other components within normal limits  COMPREHENSIVE METABOLIC PANEL - Abnormal; Notable for the following:    Potassium 3.3 (*)    Glucose, Bld 141 (*)    Total Protein 6.1 (*)    Albumin 3.1 (*)    ALT 10 (*)    GFR calc non Af Amer 58 (*)    All other components within normal limits  BRAIN NATRIURETIC PEPTIDE - Abnormal; Notable for the following:    B Natriuretic Peptide 2294.6 (*)    All other components within normal limits  CBC -  Abnormal; Notable for the following:    WBC 15.6 (*)    All other components within normal limits  URINALYSIS, ROUTINE W REFLEX MICROSCOPIC (NOT AT [facility]) - Abnormal; Notable for the following:    Leukocytes, UA SMALL (*)    All other components within normal limits  URINE MICROSCOPIC-ADD ON - Abnormal; Notable for the following:    Squamous Epithelial / LPF 0-5 (*)    Bacteria, UA RARE (*)    All other components within normal limits  COMPREHENSIVE METABOLIC PANEL - Abnormal; Notable for the following:    Potassium 3.1 (*)    Chloride 98 (*)    Glucose, Bld 112 (*)    Calcium 8.7 (*)    Total Protein 5.7 (*)    Albumin 2.7 (*)    ALT 9 (*)    All other components within normal limits  CBC WITH DIFFERENTIAL/PLATELET - Abnormal; Notable for the following:    RBC 3.49 (*)    Hemoglobin 10.6 (*)    HCT 31.7 (*)    All other components within normal limits  CBC - Abnormal; Notable for the following:    WBC 12.2 (*)    RBC 3.45 (*)    Hemoglobin 10.1 (*)    HCT 31.9 (*)    All other components within normal limits  BASIC METABOLIC PANEL - Abnormal; Notable for the following:    Glucose, Bld 110 (*)    BUN 24 (*)    Calcium 8.4 (*)    GFR calc non Af Amer 59 (*)    All other components within normal limits  I-STAT CHEM 8, ED - Abnormal; Notable for the  following:    Potassium 3.3 (*)    Glucose, Bld 140 (*)    All other components within normal limits  CULTURE, BLOOD (ROUTINE X 2)  CULTURE, BLOOD (ROUTINE X 2)  URINE CULTURE  MRSA PCR SCREENING  CULTURE, EXPECTORATED SPUTUM-ASSESSMENT  GRAM STAIN  RESPIRATORY VIRUS PANEL  STREP PNEUMONIAE URINARY ANTIGEN  LEGIONELLA ANTIGEN, URINE  INFLUENZA PANEL BY PCR (TYPE A & B, H1N1)  MAGNESIUM  MAGNESIUM  CBC WITH DIFFERENTIAL/PLATELET  CBC  BASIC METABOLIC PANEL  I-STAT TROPOININ, ED    Imaging Review No results found. I have personally reviewed and evaluated these images and lab results as part of my medical decision-making.   EKG Interpretation   Date/Time:  Friday May 15 2015 10:59:12 EST Ventricular Rate:  113 PR Interval:  131 QRS Duration: 120 QT Interval:  362 QTC Calculation: 496 R Axis:   76 Text Interpretation:  Sinus tachycardia Nonspecific intraventricular  conduction delay Anteroseptal infarct, old Nonspecific repol abnormality,  lateral leads Baseline wander in lead(s) V4 since last tracing no  significant change Confirmed by Devlin Mcveigh  MD, Annalysa Mohammad ([id number]) on 05/15/2015  11:19:23 AM      MDM   Final diagnoses:  Acute systolic congestive heart failure (HCC)  Cough    The patient is in acute respiratory distress, she has diffuse rales, she has tachypnea, she is hypoxic to 86% on room air.  The patient is critically ill, she is to require Lasix, nitro drip, supportive oxygenation and possibly BiPAP. We'll discuss with cardiology, she'll need to be admitted to the hospital.  EMR reviewed - EF 40-45% 2 months ago  CRITICAL CARE Performed by: Johnna Acosta Total critical care time: 35 minutes Critical care time was exclusive of separately billable procedures and treating other patients. Critical care was necessary to treat or prevent imminent or  life-threatening deterioration. Critical care was time spent personally by me on the following activities:  development of treatment plan with patient and/or surrogate as well as nursing, discussions with consultants, evaluation of patient's response to treatment, examination of patient, obtaining history from patient or surrogate, ordering and performing treatments and interventions, ordering and review of laboratory studies, ordering and review of radiographic studies, pulse oximetry and re-evaluation of patient's condition.   Noemi Chapel, MD 05/19/15 [phone]

## 2015-05-16 ENCOUNTER — Encounter (HOSPITAL_COMMUNITY): Payer: Self-pay

## 2015-05-16 DIAGNOSIS — I350 Nonrheumatic aortic (valve) stenosis: Secondary | ICD-10-CM

## 2015-05-16 DIAGNOSIS — E785 Hyperlipidemia, unspecified: Secondary | ICD-10-CM | POA: Diagnosis present

## 2015-05-16 DIAGNOSIS — F32A Depression, unspecified: Secondary | ICD-10-CM | POA: Diagnosis present

## 2015-05-16 DIAGNOSIS — F411 Generalized anxiety disorder: Secondary | ICD-10-CM

## 2015-05-16 DIAGNOSIS — I5042 Chronic combined systolic (congestive) and diastolic (congestive) heart failure: Secondary | ICD-10-CM

## 2015-05-16 DIAGNOSIS — E038 Other specified hypothyroidism: Secondary | ICD-10-CM | POA: Diagnosis present

## 2015-05-16 DIAGNOSIS — F329 Major depressive disorder, single episode, unspecified: Secondary | ICD-10-CM | POA: Diagnosis present

## 2015-05-16 DIAGNOSIS — E876 Hypokalemia: Secondary | ICD-10-CM | POA: Diagnosis present

## 2015-05-16 LAB — COMPREHENSIVE METABOLIC PANEL
ALBUMIN: 2.7 g/dL — AB (ref 3.5–5.0)
ALK PHOS: 53 U/L (ref 38–126)
ALT: 9 U/L — ABNORMAL LOW (ref 14–54)
ANION GAP: 12 (ref 5–15)
AST: 16 U/L (ref 15–41)
BUN: 14 mg/dL (ref 6–20)
CALCIUM: 8.7 mg/dL — AB (ref 8.9–10.3)
CHLORIDE: 98 mmol/L — AB (ref 101–111)
CO2: 26 mmol/L (ref 22–32)
Creatinine, Ser: 0.79 mg/dL (ref 0.44–1.00)
GFR calc non Af Amer: >60 mL/min (ref >60)
Glucose, Bld: 112 mg/dL — ABNORMAL HIGH (ref 65–99)
POTASSIUM: 3.1 mmol/L — AB (ref 3.5–5.1)
SODIUM: 136 mmol/L (ref 135–145)
Total Bilirubin: 0.4 mg/dL (ref 0.3–1.2)
Total Protein: 5.7 g/dL — ABNORMAL LOW (ref 6.5–8.1)

## 2015-05-16 LAB — CBC WITH DIFFERENTIAL/PLATELET
BASOS ABS: 0 10*3/uL (ref 0.0–0.1)
Basophils Relative: 0 %
Eosinophils Absolute: 0.2 10*3/uL (ref 0.0–0.7)
Eosinophils Relative: 2 %
HCT: 31.7 % — ABNORMAL LOW (ref 36.0–46.0)
Hemoglobin: 10.6 g/dL — ABNORMAL LOW (ref 12.0–15.0)
LYMPHS ABS: 2.1 10*3/uL (ref 0.7–4.0)
Lymphocytes Relative: 25 %
MCH: 30.4 pg (ref 26.0–34.0)
MCHC: 33.4 g/dL (ref 30.0–36.0)
MCV: 90.8 fL (ref 78.0–100.0)
Monocytes Absolute: 0.7 10*3/uL (ref 0.1–1.0)
Monocytes Relative: 8 %
NEUTROS ABS: 5.5 10*3/uL (ref 1.7–7.7)
NEUTROS PCT: 65 %
PLATELETS: 243 10*3/uL (ref 150–400)
RBC: 3.49 MIL/uL — ABNORMAL LOW (ref 3.87–5.11)
RDW: 14.6 % (ref 11.5–15.5)
WBC: 8.5 10*3/uL (ref 4.0–10.5)

## 2015-05-16 LAB — MAGNESIUM: Magnesium: 1.8 mg/dL (ref 1.7–2.4)

## 2015-05-16 MED ORDER — MAGNESIUM OXIDE 400 (241.3 MG) MG PO TABS
400.0000 mg | ORAL_TABLET | Freq: Two times a day (BID) | ORAL | Status: AC
  Administered 2015-05-16 (×2): 400 mg via ORAL
  Filled 2015-05-16 (×2): qty 1

## 2015-05-16 MED ORDER — METHYLPREDNISOLONE SODIUM SUCC 125 MG IJ SOLR
60.0000 mg | INTRAMUSCULAR | Status: DC
  Administered 2015-05-16 – 2015-05-18 (×3): 60 mg via INTRAVENOUS
  Filled 2015-05-16 (×3): qty 2

## 2015-05-16 MED ORDER — POTASSIUM CHLORIDE CRYS ER 10 MEQ PO TBCR
50.0000 meq | EXTENDED_RELEASE_TABLET | Freq: Once | ORAL | Status: AC
  Administered 2015-05-16: 50 meq via ORAL
  Filled 2015-05-16: qty 2
  Filled 2015-05-16: qty 1

## 2015-05-16 MED ORDER — MAGNESIUM SULFATE 2 GM/50ML IV SOLN: 2.0000 g | Freq: Once | INTRAVENOUS | Status: DC

## 2015-05-16 MED ORDER — IPRATROPIUM-ALBUTEROL 0.5-2.5 (3) MG/3ML IN SOLN: 3.0000 mL | RESPIRATORY_TRACT | Status: DC | PRN

## 2015-05-16 MED ORDER — IPRATROPIUM-ALBUTEROL 0.5-2.5 (3) MG/3ML IN SOLN: 3.0000 mL | Freq: Four times a day (QID) | RESPIRATORY_TRACT | Status: DC

## 2015-05-16 MED ORDER — BUPROPION HCL ER (XL) 300 MG PO TB24
300.0000 mg | ORAL_TABLET | Freq: Every day | ORAL | Status: DC
  Administered 2015-05-16 – 2015-05-19 (×4): 300 mg via ORAL
  Filled 2015-05-16 (×4): qty 1

## 2015-05-16 NOTE — Progress Notes (Signed)
[place] TEAM 1 - Stepdown/ICU TEAM Progress Note  Dana [name] [id number] DOB: 1927/06/03 DOA: 05/15/2015 PCP: Reginia Naas, MD  Admit HPI / Brief Narrative: 82 y.o. WF PMHx Depression S/P Electroconvulsive Therapy, Anxiety, Migraine, Hypothyroidism, HLD, Lleft Breast Ca, Non-Hodgkin's Lymphoma, Basal Cell Carcinoma of nasal tip, Chronic Back Pain, Bladder Prolapse.  Recently Hospitalized from 2/2- 04/29/2015 with acute respiratory failure with hypoxia due to acute combined Systolic and diastolic CHF, complicated with enterococcus UTI,   Brought by ambulance from her nursing home with worsening SOB, worse on ambulation. She had recently finished antibiotics course for PNA. She was supposed to have home O2 but had not received the tubing at the time of admission and family dynamics.  She denied any fevers, or chest pain. She denies any leg swelling. There were no changes in her medications. She may have had sick contacts 2 days prior. Patient has intermittent periods of agitation, at which time she does not wish to provide further information. On presentation, She was in obvious respiratory distress, and was febrile, with Tmax at 100. Osats were at 78 RA, increased to 98 on NRB.  She received IVF, Lasix, prn NTG and Levaquin IV   HPI/Subjective: 2/25 A/O x 4  states up until her last hospitalization was never on home O2. States yesterday had increasing SOB and called 911. Patient does not know what her baseline weight should be nor does she weighs herself daily.  Assessment/Plan:  Respiratory failure with hypoxia/HCAP -chest x-ray fndings worrisome for residual / recurrent pulmonary edema with worsening left basilar opacities, atelectasis versus infiltrate.  -Titrate O2 to maintain SPO2> 93% -Blood cultures x 2, Differential, Legionella urine antigen, Strep Pneumo urine antigen  -influenza panel negative -Mucinex as needed  -Flutter Valve -DuoNeb QID -Sodium  Medrol 60 mg daily  Fever, likely due to infection Tmax 100F -Blood cultures Legionella urine antigen, Strep Pneumo urine antigen pending  History of combined Chronic Systolic and Diastolic CHF/Severe AS  -ECHO in 02/2015 nl LVF 40-45%, gr 1 diast HF, mod MR and AR, pulm HTN -CXR shows HF but improved compared to prior on 2/2. BNP elevated at 2294.  -EKG comparison to recent EKG 04/23/15 I: That EKG is a very poor quality but does not appear to be significant changes -Daily weights admission weight= 62.5 kg        2/25 weight=?? -strict I/O since admission -1.1 L - Lasix 40 mg daily  Leukocytosis,  -likely reactive Current value 16 -Check blood cultures - abx as above; will DC antibiotics in A.m. if patient remains afebrile/negative leukocytosis - Repeat WBC in AM  Hypokalemia, -Due to diuretics current K 3.1 -Potassium goal>4 -Potassium k-Dur 50 meq  Hypomagnesemia -Magnesium goal >2  -Mag-Ox 400 mg 2 doses  Hypothyroidism:  -Chronic.Last TSH was on 0.674 on 02/2015  -Continue home Synthroid 88 g daily  Hyperlipidemia Continue Zocor 20 mg daily  Anxiety/Depression Continue Valium 10 mg PRN Continue Wellbutrin XL 300 mg daily  Code Status: FULL Family Communication: no family present at time of exam Disposition Plan: Return Independent living    Consultants: NA  Procedure/Significant Events:    Culture 2/24 blood pending 2/24 urine pending 2/24 MRSA by PCR negative 2/24 strep pneumo urine antigen negative 2/24 influenza A/B/H1N1 negative 2/25 respiratory virus panel pending   Antibiotics: Levofloxacin 2/24 one dose Aztreonam 2/24>> Vancomycin 2/24>>  DVT prophylaxis: Lovenox   Devices NA   LINES / TUBES:  NA    Continuous Infusions:   Objective:  VITAL SIGNS: Temp: 97.7 F (36.5 C) (02/25 1152) Temp Source: Oral (02/25 1152) BP: 108/54 mmHg (02/25 1152) Pulse Rate: 91 (02/25 1152) SPO2; FIO2:   Intake/Output Summary  (Last 24 hours) at 05/16/15 1229 Last data filed at 05/16/15 1110  Gross per 24 hour  Intake    740 ml  Output   1900 ml  Net  -1160 ml     Exam: General: A/O 4, NAD, positive acute respiratory distress Eyes: Negative headache, negative scleral hemorrhage ENT: Negative Runny nose, negative gingival bleeding, Neck:  Negative scars, masses, torticollis, lymphadenopathy, JVD Lungs: diffuse crackles, mild expiratory wheezing  Cardiovascular: Regular rate and rhythm without murmur gallop or rub normal S1 and S2 Abdomen:negative abdominal pain, nondistended, positive soft, bowel sounds, no rebound, no ascites, no appreciable mass Extremities: No significant cyanosis, clubbing, or edema bilateral lower extremities Psychiatric:  Negative depression, negative anxiety, negative fatigue, negative mania Neurologic:  Cranial nerves II through XII intact, tongue/uvula midline, all extremities muscle strength 5/5, sensation intact throughout, negative dysarthria, negative expressive aphasia, negative receptive aphasia.   Data Reviewed: Basic Metabolic Panel:  Recent Labs Lab 05/15/15 1133 05/15/15 1148 05/16/15 0825  NA 141 142 136  K 3.3* 3.3* 3.1*  CL 105 101 98*  CO2 23  --  26  GLUCOSE 141* 140* 112*  BUN 12 14 14   CREATININE 0.88 0.80 0.79  CALCIUM 9.1  --  8.7*  MG  --   --  1.8   Liver Function Tests:  Recent Labs Lab 05/15/15 1133 05/16/15 0825  AST 17 16  ALT 10* 9*  ALKPHOS 60 53  BILITOT 0.4 0.4  PROT 6.1* 5.7*  ALBUMIN 3.1* 2.7*   No results for input(s): LIPASE, AMYLASE in the last 168 hours. No results for input(s): AMMONIA in the last 168 hours. CBC:  Recent Labs Lab 05/15/15 1133 05/15/15 1148 05/15/15 1610 05/16/15 0825  WBC 16.0*  --  15.6* 8.5  NEUTROABS  --   --   --  5.5  HGB 11.2* 12.2 12.3 10.6*  HCT 32.9* 36.0 36.8 31.7*  MCV 91.1  --  91.3 90.8  PLT 280  --  279 243   Cardiac Enzymes: No results for input(s): CKTOTAL, CKMB, CKMBINDEX,  TROPONINI in the last 168 hours. BNP (last 3 results)  Recent Labs  04/23/15 1325 04/24/15 0357 05/15/15 1133  BNP 2532.8* 2132.5* 2294.6*    ProBNP (last 3 results) No results for input(s): PROBNP in the last 8760 hours.  CBG: No results for input(s): GLUCAP in the last 168 hours.  Recent Results (from the past 240 hour(s))  Culture, blood (routine x 2) Call MD if unable to obtain prior to antibiotics being given     Status: None (Preliminary result)   Collection Time: 05/15/15  4:20 PM  Result Value Ref Range Status   Specimen Description BLOOD RIGHT ANTECUBITAL  Final   Special Requests BOTTLES DRAWN AEROBIC ONLY 10CC  Final   Culture NO GROWTH < 24 HOURS  Final   Report Status PENDING  Incomplete  Culture, blood (routine x 2) Call MD if unable to obtain prior to antibiotics being given     Status: None (Preliminary result)   Collection Time: 05/15/15  4:27 PM  Result Value Ref Range Status   Specimen Description BLOOD RIGHT HAND  Final   Special Requests IN PEDIATRIC BOTTLE 4CC  Final   Culture NO GROWTH < 24 HOURS  Final   Report Status PENDING  Incomplete  MRSA  PCR Screening     Status: None   Collection Time: 05/15/15  7:07 PM  Result Value Ref Range Status   MRSA by PCR NEGATIVE NEGATIVE Final    Comment:        The GeneXpert MRSA Assay (FDA approved for NASAL specimens only), is one component of a comprehensive MRSA colonization surveillance program. It is not intended to diagnose MRSA infection nor to guide or monitor treatment for MRSA infections.      Studies:  Recent x-ray studies have been reviewed in detail by the Attending Physician  Scheduled Meds:  Scheduled Meds: . aztreonam  1 g Intravenous 3 times per day  . buPROPion  300 mg Oral Daily  . enoxaparin (LOVENOX) injection  40 mg Subcutaneous Q24H  . furosemide  40 mg Intravenous Daily  . guaiFENesin  600 mg Oral BID  . iron polysaccharides  150 mg Oral Daily  . levothyroxine  88 mcg  Oral QAC breakfast  . magnesium oxide  400 mg Oral BID  . methylPREDNISolone (SOLU-MEDROL) injection  60 mg Intravenous Q24H  . pantoprazole  40 mg Oral Daily  . simvastatin  20 mg Oral Daily  . sodium chloride flush  3 mL Intravenous Q12H  . vancomycin  1,000 mg Intravenous Q24H    Time spent on care of this patient: 40 mins   WOODS, Geraldo Docker , MD  [facility] Office  [phone] Pager - [phone]  On-Call/Text Page:      [email or web address]      password [email or web address]  If 7PM-7AM, please contact night-coverage [email or web address] Password [facility] 05/16/2015, 12:29 PM   LOS: 1 day   Care during the described time interval was provided by me .  I have reviewed this patient's available data, including medical history, events of note, physical examination, and all test results as part of my evaluation. I have personally reviewed and interpreted all radiology studies.   Dia Crawford, MD [phone] Pager

## 2015-05-17 DIAGNOSIS — A419 Sepsis, unspecified organism: Principal | ICD-10-CM

## 2015-05-17 DIAGNOSIS — F4323 Adjustment disorder with mixed anxiety and depressed mood: Secondary | ICD-10-CM

## 2015-05-17 MED ORDER — POTASSIUM CHLORIDE CRYS ER 20 MEQ PO TBCR
40.0000 meq | EXTENDED_RELEASE_TABLET | Freq: Once | ORAL | Status: AC
  Administered 2015-05-17: 40 meq via ORAL
  Filled 2015-05-17: qty 2

## 2015-05-17 NOTE — Progress Notes (Signed)
[facility]                                                                              Patient Demographics  Dana [name], is a 82 y.o. female, DOB - 12-05-1927, [id number]  Admit date - 05/15/2015   Admitting Physician Albertine Patricia, MD  Outpatient Primary MD for the patient is Reginia Naas, MD  LOS - 2   Chief Complaint  Patient presents with  . Shortness of Breath      HPI on 05/15/15 by Ms. Sharene Butters, PA Dana [name] is a 82 y.o. female Recently hospitalized from 2/2- 04/29/2015 with acute respiratory failure with hypoxia due to acute combined systolic and diastolic CHF, complicated with enterococcus UTI, brought by ambulance from her nursing home with worsening SOB, worse on ambulation. She had recently finished antibiotics course for PNA. She was supposed to have home O2 but had not received the tubing at the time of admission and family dynamics.  She denied any fevers, or chest pain. She denies any leg swelling. There were no changes in her medications. She may have had sick contacts 2 days prior. Patient has intermittent periods of agitation, at which time she does not wish to provide further information. On presentation, She was in obvious respiratory distress, and was febrile, with Tmax at 100. Osats were at 78 RA, increased to 98 on NRB.  She received IVF, Lasix, prn NTG and Levaquin IV  Assessment & Plan   Sepsis secondary to HCAP -Upon admission, patient was tachycardic with tachypnea, leukocytosis -Vital signs have normalized, leukocytosis resolving -Chest x-ray showed recurrent pulmonary edema with worsening left basilar opacity, atelectasis versus infiltrate -Continue vancomycin and aztreonam given penicillin allergy -Blood cultures negative to date -Strep pneumonia urine antigen negative, pending legionella urine antigen -Influenza PCR negative, pending respiratory viral panel  Acute respiratory failure with hypoxia -Likely  secondary to the above -Continue supplemental oxygen, Solu-Medrol, neb treatments  Chronic combined systolic and diastolic heart failure/severe aortic stenosis -Echocardiogram December 2016 showed an EF of [id number], grade 1 diastolic heart failure -BNP 2294 (chronically elevated?) -Currently does not appear to be volume overloaded -Continue to monitor daily weights, intake and output -Continue Lasix  Hypokalemia -Likely secondary to diuresis -Will replace and continue to monitor BMP  Hypomagnesemia -Continue Mag-Ox, monitor magnesium level  Hypothyroidism -Continue Synthroid  Hyperlipidemia -Continue statin  Anxiety/depression -Continue Wellbutrin/Valium  Code Status: Full  Family Communication:  None at bedside  Disposition Plan: Admitted. Pending improvement  Time Spent in minutes   30 minutes  Procedures  None  Consults   None  DVT Prophylaxis  Lovenox  Lab Results  Component Value Date   PLT 243 05/16/2015    Medications  Scheduled Meds: . aztreonam  1 g Intravenous 3 times per day  . buPROPion  300 mg Oral Daily  . enoxaparin (LOVENOX) injection  40 mg Subcutaneous Q24H  . furosemide  40 mg Intravenous Daily  . guaiFENesin  600 mg Oral BID  . iron polysaccharides  150 mg Oral Daily  . levothyroxine  88 mcg Oral QAC breakfast  . methylPREDNISolone (SOLU-MEDROL) injection  60 mg Intravenous Q24H  . pantoprazole  40 mg  Oral Daily  . simvastatin  20 mg Oral Daily  . sodium chloride flush  3 mL Intravenous Q12H  . vancomycin  1,000 mg Intravenous Q24H   Continuous Infusions:  PRN Meds:.sodium chloride, diazepam, ipratropium-albuterol, sodium chloride flush  Antibiotics    Anti-infectives    Start     Dose/Rate Route Frequency Ordered Stop   05/16/15 1300  vancomycin (VANCOCIN) IVPB 1000 mg/200 mL premix  Status:  Discontinued     1,000 mg 200 mL/hr over 60 Minutes Intravenous Every 24 hours 05/15/15 1410 05/15/15 1509   05/15/15 1800  vancomycin  (VANCOCIN) IVPB 1000 mg/200 mL premix     1,000 mg 200 mL/hr over 60 Minutes Intravenous Every 24 hours 05/15/15 1749     05/15/15 1745  aztreonam (AZACTAM) 1 g in dextrose 5 % 50 mL IVPB     1 g 100 mL/hr over 30 Minutes Intravenous 3 times per day 05/15/15 1742     05/15/15 1515  cefTRIAXone (ROCEPHIN) 1 g in dextrose 5 % 50 mL IVPB  Status:  Discontinued     1 g 100 mL/hr over 30 Minutes Intravenous Every 24 hours 05/15/15 1509 05/15/15 1731   05/15/15 1515  azithromycin (ZITHROMAX) 500 mg in dextrose 5 % 250 mL IVPB  Status:  Discontinued     500 mg 250 mL/hr over 60 Minutes Intravenous Every 24 hours 05/15/15 1509 05/15/15 1731   05/15/15 1415  ceFEPIme (MAXIPIME) 1 g in dextrose 5 % 50 mL IVPB  Status:  Discontinued     1 g 100 mL/hr over 30 Minutes Intravenous Every 12 hours 05/15/15 1409 05/15/15 1509   05/15/15 1400  aztreonam (AZACTAM) 2 g in dextrose 5 % 50 mL IVPB  Status:  Discontinued     2 g 100 mL/hr over 30 Minutes Intravenous 3 times per day 05/15/15 1347 05/15/15 1408   05/15/15 1400  vancomycin (VANCOCIN) IVPB 1000 mg/200 mL premix  Status:  Discontinued     1,000 mg 200 mL/hr over 60 Minutes Intravenous  Once 05/15/15 1349 05/15/15 1509   05/15/15 1300  levofloxacin (LEVAQUIN) IVPB 500 mg     500 mg 100 mL/hr over 60 Minutes Intravenous  Once 05/15/15 1222 05/15/15 1450      Subjective:   Dana [name] seen and examined today. Patient states she is feeling mildly better. Denies any chest pain, abdominal pain, nausea or vomiting, dizziness or headache.  Objective:   Filed Vitals:   05/16/15 1200 05/16/15 1633 05/16/15 2010 05/17/15 0502  BP: 113/77 120/55 95/53 133/62  Pulse: 92 102 97 98  Temp:  97.8 F (36.6 C) 97.8 F (36.6 C) 98.1 F (36.7 C)  TempSrc:  Oral Oral Oral  Resp: 17 18 18 18   Height:  5' (1.524 m)    Weight:  62.7 kg (138 lb 3.7 oz)  62.052 kg (136 lb 12.8 oz)  SpO2: 95% 98% 97% 100%    Wt Readings from Last 3 Encounters:  05/17/15  62.052 kg (136 lb 12.8 oz)  04/29/15 61.508 kg (135 lb 9.6 oz)  03/04/15 69.128 kg (152 lb 6.4 oz)     Intake/Output Summary (Last 24 hours) at 05/17/15 1309 Last data filed at 05/17/15 1100  Gross per 24 hour  Intake    390 ml  Output   1050 ml  Net   -660 ml    Exam  General: Well developed, well nourished, NAD, appears stated age  HEENT: NCAT, mucous membranes moist.   Cardiovascular: S1  S2 auscultated, RRR, no murmus  Respiratory: Diminished breath sounds, mild expiratory wheezing  Abdomen: Soft, nontender, nondistended, + bowel sounds  Extremities: warm dry without cyanosis clubbing or edema  Neuro: AAOx3, nonfocal  Psych: Normal affect and demeanor  Data Review   Micro Results Recent Results (from the past 240 hour(s))  Culture, blood (routine x 2) Call MD if unable to obtain prior to antibiotics being given     Status: None (Preliminary result)   Collection Time: 05/15/15  4:20 PM  Result Value Ref Range Status   Specimen Description BLOOD RIGHT ANTECUBITAL  Final   Special Requests BOTTLES DRAWN AEROBIC ONLY 10CC  Final   Culture NO GROWTH < 24 HOURS  Final   Report Status PENDING  Incomplete  Culture, blood (routine x 2) Call MD if unable to obtain prior to antibiotics being given     Status: None (Preliminary result)   Collection Time: 05/15/15  4:27 PM  Result Value Ref Range Status   Specimen Description BLOOD RIGHT HAND  Final   Special Requests IN PEDIATRIC BOTTLE 4CC  Final   Culture NO GROWTH < 24 HOURS  Final   Report Status PENDING  Incomplete  Urine culture     Status: None (Preliminary result)   Collection Time: 05/15/15  4:45 PM  Result Value Ref Range Status   Specimen Description URINE, CLEAN CATCH  Final   Special Requests NONE  Final   Culture TOO YOUNG TO READ  Final   Report Status PENDING  Incomplete  MRSA PCR Screening     Status: None   Collection Time: 05/15/15  7:07 PM  Result Value Ref Range Status   MRSA by PCR NEGATIVE  NEGATIVE Final    Comment:        The GeneXpert MRSA Assay (FDA approved for NASAL specimens only), is one component of a comprehensive MRSA colonization surveillance program. It is not intended to diagnose MRSA infection nor to guide or monitor treatment for MRSA infections.     Radiology Reports Dg Chest 2 View  04/23/2015  CLINICAL DATA:  Worsening shortness of Breath EXAM: CHEST  2 VIEW COMPARISON:  03/04/2015 FINDINGS: Cardiomediastinal silhouette is stable. Tortuous ascending aorta with atherosclerotic calcifications are noted. Hiatal hernia again noted. Again noted bilateral interstitial prominence suspicious for interstitial edema or pneumonitis. There is trace left pleural effusion with left basilar atelectasis or infiltrate. IMPRESSION: Hiatal hernia again noted. Again noted bilateral interstitial prominence suspicious for interstitial edema or pneumonitis. There is trace left pleural effusion with left basilar atelectasis or infiltrate. Electronically Signed   By: Lahoma Crocker M.D.   On: 04/23/2015 12:57   Dg Chest Portable 1 View  05/15/2015  CLINICAL DATA:  Shortness of breath EXAM: PORTABLE CHEST 1 VIEW COMPARISON:  04/24/2015; 04/23/2015; 05/04/2014 ; 03/14/2014 FINDINGS: Grossly unchanged enlarged cardiac silhouette and mediastinal contours with atherosclerotic plaque within a tortuous thoracic aorta. There is persistent rightward deviation of the tracheal air column at the level of the aortic arch, potentially accentuated due to patient rotation. Worsening left basilar heterogeneous opacities. Pulmonary vascular remains indistinct with cephalization of flow, most conspicuous in the right upper and mid lung, potentially accentuated due to patient rotation. Trace pleural effusions are not excluded. No pneumothorax. Unchanged bones. IMPRESSION: Findings worrisome for residual / recurrent pulmonary edema with worsening left basilar opacities, atelectasis versus infiltrate. Further  evaluation with a PA and lateral chest radiograph may be obtained as clinically indicated. Electronically Signed   By: Sandi Mariscal  M.D.   On: 05/15/2015 12:02   Dg Chest Port 1 View  04/24/2015  CLINICAL DATA:  CHF EXAM: PORTABLE CHEST 1 VIEW COMPARISON:  04/23/2015 FINDINGS: Cardiac shadow is stable. Aortic calcifications are again identified. Changes consistent with congestive failure are again noted. No focal confluent infiltrate is seen. No sizable effusion is noted. No bony abnormality is seen. IMPRESSION: Stable changes consistent with CHF. Electronically Signed   By: Inez Catalina M.D.   On: 04/24/2015 07:40    CBC  Recent Labs Lab 05/15/15 1133 05/15/15 1148 05/15/15 1610 05/16/15 0825  WBC 16.0*  --  15.6* 8.5  HGB 11.2* 12.2 12.3 10.6*  HCT 32.9* 36.0 36.8 31.7*  PLT 280  --  279 243  MCV 91.1  --  91.3 90.8  MCH 31.0  --  30.5 30.4  MCHC 34.0  --  33.4 33.4  RDW 14.4  --  14.3 14.6  LYMPHSABS  --   --   --  2.1  MONOABS  --   --   --  0.7  EOSABS  --   --   --  0.2  BASOSABS  --   --   --  0.0    Chemistries   Recent Labs Lab 05/15/15 1133 05/15/15 1148 05/16/15 0825  NA 141 142 136  K 3.3* 3.3* 3.1*  CL 105 101 98*  CO2 23  --  26  GLUCOSE 141* 140* 112*  BUN 12 14 14   CREATININE 0.88 0.80 0.79  CALCIUM 9.1  --  8.7*  MG  --   --  1.8  AST 17  --  16  ALT 10*  --  9*  ALKPHOS 60  --  53  BILITOT 0.4  --  0.4   ------------------------------------------------------------------------------------------------------------------ estimated creatinine clearance is 42.3 mL/min (by C-G formula based on Cr of 0.79). ------------------------------------------------------------------------------------------------------------------ No results for input(s): HGBA1C in the last 72 hours. ------------------------------------------------------------------------------------------------------------------ No results for input(s): CHOL, HDL, LDLCALC, TRIG, CHOLHDL, LDLDIRECT  in the last 72 hours. ------------------------------------------------------------------------------------------------------------------ No results for input(s): TSH, T4TOTAL, T3FREE, THYROIDAB in the last 72 hours.  Invalid input(s): FREET3 ------------------------------------------------------------------------------------------------------------------ No results for input(s): VITAMINB12, FOLATE, FERRITIN, TIBC, IRON, RETICCTPCT in the last 72 hours.  Coagulation profile No results for input(s): INR, PROTIME in the last 168 hours.  No results for input(s): DDIMER in the last 72 hours.  Cardiac Enzymes No results for input(s): CKMB, TROPONINI, MYOGLOBIN in the last 168 hours.  Invalid input(s): CK ------------------------------------------------------------------------------------------------------------------ Invalid input(s): POCBNP    Tommy Minichiello D.O. on 05/17/2015 at 1:09 PM  Between 7am to 7pm - Pager - [phone]  After 7pm go to [email or web address] - password [email or web address]  And look for the night coverage person covering for me after hours  [facility] Office  [phone]

## 2015-05-18 LAB — BASIC METABOLIC PANEL
ANION GAP: 9 (ref 5–15)
BUN: 24 mg/dL — AB (ref 6–20)
CHLORIDE: 102 mmol/L (ref 101–111)
CO2: 26 mmol/L (ref 22–32)
Calcium: 8.4 mg/dL — ABNORMAL LOW (ref 8.9–10.3)
Creatinine, Ser: 0.87 mg/dL (ref 0.44–1.00)
GFR calc non Af Amer: 59 mL/min — ABNORMAL LOW (ref >60)
Glucose, Bld: 110 mg/dL — ABNORMAL HIGH (ref 65–99)
POTASSIUM: 4.4 mmol/L (ref 3.5–5.1)
SODIUM: 137 mmol/L (ref 135–145)

## 2015-05-18 LAB — CBC
HCT: 31.9 % — ABNORMAL LOW (ref 36.0–46.0)
HEMOGLOBIN: 10.1 g/dL — AB (ref 12.0–15.0)
MCH: 29.3 pg (ref 26.0–34.0)
MCHC: 31.7 g/dL (ref 30.0–36.0)
MCV: 92.5 fL (ref 78.0–100.0)
Platelets: 256 10*3/uL (ref 150–400)
RBC: 3.45 MIL/uL — AB (ref 3.87–5.11)
RDW: 14.6 % (ref 11.5–15.5)
WBC: 12.2 10*3/uL — ABNORMAL HIGH (ref 4.0–10.5)

## 2015-05-18 LAB — LEGIONELLA ANTIGEN, URINE

## 2015-05-18 LAB — MAGNESIUM: MAGNESIUM: 1.9 mg/dL (ref 1.7–2.4)

## 2015-05-18 MED ORDER — PREDNISONE 20 MG PO TABS
40.0000 mg | ORAL_TABLET | Freq: Every day | ORAL | Status: DC
  Administered 2015-05-19: 40 mg via ORAL
  Filled 2015-05-18: qty 2

## 2015-05-18 NOTE — Consult Note (Signed)
   [facility] CM Inpatient Consult   05/18/2015  Dana [name] Jul 24, 1927 [id number] Patient is currently active with [facility] for chronic disease management services, patient is a re-admission.  Patient has been engaged by a [facility] RN [employer].  [place] was having difficulty connecting with the patient.  Came by to see the patient and patient was involved in care with the physical therapist.   Our community based plan of care has focused on disease management and community resource support.  Patient will receive a post discharge transition of care call and will be evaluated for monthly home visits for assessments and disease process education.  Will make Inpatient Case Manager aware that [facility] following. Will follow up with the patient at a more convenient time to check for additional barriers.   Of note, [facility] Management services does not replace or interfere with any services that are needed or arranged by inpatient case management or social work.  For additional questions or referrals please contact: Natividad Brood, RN BSN [facility] Liaison  [phone] business mobile phone Toll free office [phone]

## 2015-05-18 NOTE — Progress Notes (Signed)
ANTIBIOTIC CONSULT NOTE - INITIAL  Pharmacy Consult for Vanco/Aztreonam Indication: PNA, UTI  Allergies  Allergen Reactions  . Amoxicillin Other (See Comments)    Excessive sweating   . Amoxicillin-Pot Clavulanate Other (See Comments)  . Azithromycin Other (See Comments)    Numbness/pain in fingers  . Celexa [Citalopram Hydrobromide]     unknown  . Iohexol      Desc: will need premedicated   . Pollen Extract   . Prozac [Fluoxetine Hcl]     unknown  . Levaquin [Levofloxacin In D5w] Rash  . Promethazine Other (See Comments)    Restlessness    . Shrimp [Shellfish Allergy] Itching    Patient Measurements: Height: 5' (152.4 cm) Weight: 138 lb 4.8 oz (62.732 kg) (b scale) IBW/kg (Calculated) : 45.5 Adjusted Body Weight:   Vital Signs: Temp: 97.5 F (36.4 C) (02/27 1141) Temp Source: Oral (02/27 1141) BP: 111/56 mmHg (02/27 1141) Pulse Rate: 89 (02/27 1141) Intake/Output from previous day: 02/26 0701 - 02/27 0700 In: 1040 [P.O.:840; IV Piggyback:200] Out: 1300 [Urine:1300] Intake/Output from this shift: Total I/O In: 360 [P.O.:360] Out: 600 [Urine:600]  Labs:  Recent Labs  05/15/15 1610 05/16/15 0825 05/18/15 0343  WBC 15.6* 8.5 12.2*  HGB 12.3 10.6* 10.1*  PLT 279 243 256  CREATININE  --  0.79 0.87   Estimated Creatinine Clearance: 39.1 mL/min (by C-G formula based on Cr of 0.87). No results for input(s): VANCOTROUGH, VANCOPEAK, VANCORANDOM, GENTTROUGH, GENTPEAK, GENTRANDOM, TOBRATROUGH, TOBRAPEAK, TOBRARND, AMIKACINPEAK, AMIKACINTROU, AMIKACIN in the last 72 hours.   Microbiology:   Medical History: Past Medical History  Diagnosis Date  . Hypothyroidism   . Esophageal reflux   . Seasonal allergies   . Hypercholesteremia   . Depression   . Anxiety   . History of diverticulitis of colon   . History of peptic ulcer   . Chronic back pain     "gets it worse as it goes down" (02/24/2015)  . Bladder prolapse, female, acquired   . Heart murmur   .  History of electroconvulsive therapy 1956    "then had insulin shock; for deep deep depression"  . History of hiatal hernia   . Migraine     "might have one maybe once/month" (02/24/2015)  . Arthritis     "in my back" (02/24/2015)  . Breast cancer, left breast ([place]) 1970  . Non Hodgkin's lymphoma ([place]) 2001  . Basal cell carcinoma of nasal tip    Assessment: SOB, recent PNA, hospital 2/2-2/8 (hypoxic RF, HF, enterococcus UTI). Recently finished a course of abx.  ID: D#4 Vanc/Azactam for HCAP  2/24 CTX x 1  2/24 Azith x 1>>developed allergic rxn 2/24 Levaquin x 1>>developed allergic rxn 2/24 Vanc>> 2/24 Azactam>>  2/24 BC: pending 2/24 UC: 80,000 Enterococcus  Goal of Therapy:  Vancomycin trough level 15-20 mcg/ml  Plan:  1) Vanc 1g q24h. Trough tomorrow if not transitioned to oral abx 2) Azactam 1g q8h 3) daily CBC, cultures and sensitivites   Sharmain Lastra S. Alford Highland, PharmD, BCPS Clinical Staff Pharmacist Pager [phone]  Eilene Ghazi Stillinger 05/18/2015,1:33 PM

## 2015-05-18 NOTE — Progress Notes (Signed)
CSW received consult that patient was admitted from a facility.  Pt is admitted from [facility]- no need for CSW involvement for return to [place] level of care  CSW signing off  [name], [place] Worker [phone]

## 2015-05-18 NOTE — Evaluation (Signed)
Physical Therapy Evaluation Patient Details Name: Dana [name] MRN: [id number] DOB: 10/28/1927 Today's Date: 05/18/2015   History of Present Illness  82 y.o. female admitted to [facility] on 05/15/15 for SOB.  Pt was recently in the hospital 2/2-04/29/15 with acute respiratory failure with hypoxia due to acute combined systaolic and diastolic CHF as well as UTI.  Pt dx this time with sepsis secondary to HCAP, acute respiratory failure with hypoxia, and chronic combined systolic and diastolic hear failure/severe aortic stenosis.  Pt with other significant PMHx of chronic back pain (s/p lumbar laminectomy in 2012), breast CA L breast s/p radical mastectomy, and Non-Hodgkin's lymphoma.  Clinical Impression  Pt was able to get up and walk a short distance down the hallway with O2 and the RW.  O2 sats remained stable on 2 L O2 [place] during gait and pt was able to walk and talk without getting too out of breath.  She is weak and has significant h/o multiple recent falls.  She had a bad experience at the last SNF for rehab she went to, but I think she would be safer going to rehab to get stronger before returning to her independent living apartment.  Of note, she was supposed to be starting therapy at [facility] to work on her balance and strength PTA.   PT to follow acutely for deficits listed below.       Follow Up Recommendations SNF    Equipment Recommendations  None recommended by PT    Recommendations for Other Services   NA    Precautions / Restrictions Precautions Precautions: Fall Precaution Comments: pt reports significant h/o multiple falls.       Mobility  Bed Mobility Overal bed mobility: Modified Independent Bed Mobility: Supine to Sit     Supine to sit: HOB elevated;Modified independent (Device/Increase time)     General bed mobility comments: Pt able to pull her way to the EOB with bed rails for leverage and HOB elevated ~30 degrees  Transfers Overall transfer level: Needs  assistance Equipment used: Rolling walker (2 wheeled) Transfers: Sit to/from Stand Sit to Stand: Min assist         General transfer comment: Min guard assist for safety as pt stood before PT was ready with the RW.    Ambulation/Gait Ambulation/Gait assistance: Min guard Ambulation Distance (Feet): 65 Feet Assistive device: Rolling walker (2 wheeled) Gait Pattern/deviations: Step-through pattern;Shuffle;Staggering left;Staggering right Gait velocity: decreased Gait velocity interpretation: Below normal speed for age/gender General Gait Details: Min guard assist for safety and balance during gait.  2 L O2 [place] used for gait and pt had minimal reports of DOE and HR and O2 sats remained stable on 2 L.           Balance Overall balance assessment: Needs assistance Sitting-balance support: Feet supported;No upper extremity supported Sitting balance-Leahy Scale: Good     Standing balance support: Bilateral upper extremity supported Standing balance-Leahy Scale: Poor                               Pertinent Vitals/Pain Pain Assessment: No/denies pain    Home Living Family/patient expects to be discharged to:: Private residence Living Arrangements: Alone Available Help at Discharge: Personal care attendant;Other (Comment) (has call bell/intercom in room) Type of Home: Independent living facility ([facility]) Home Access: Level entry     Home Layout: One level Home Equipment: [place] - 4 wheels  Prior Function Level of Independence: Needs assistance   Gait / Transfers Assistance Needed: used 4 wheeled walker, admits she has fallen     Comments: She walks daily to the dining hall for meals.  Reports she does not use oxygen at baseline.         Extremity/Trunk Assessment   Upper Extremity Assessment: Generalized weakness           Lower Extremity Assessment: Generalized weakness;LLE deficits/detail   LLE Deficits / Details: Pt is generally  weak and deconditioned, however, reports that her left leg has "sciatica" and that she often has weakness in this leg.  She thinks this may be why she falls so frequently.   Cervical / Trunk Assessment: Other exceptions  Communication   Communication: HOH  Cognition Arousal/Alertness: Awake/alert Behavior During Therapy: WFL for tasks assessed/performed Overall Cognitive Status: Within Functional Limits for tasks assessed (not specifically tested, but hx seemed normal/conversation )                               Assessment/Plan    PT Assessment Patient needs continued PT services  PT Diagnosis Difficulty walking;Abnormality of gait;Generalized weakness   PT Problem List Decreased strength;Decreased activity tolerance;Decreased balance;Decreased mobility;Decreased knowledge of use of DME;Pain;Cardiopulmonary status limiting activity  PT Treatment Interventions DME instruction;Gait training;Functional mobility training;Therapeutic activities;Therapeutic exercise;Balance training;Neuromuscular re-education;Patient/family education   PT Goals (Current goals can be found in the Care Plan section) Acute Rehab PT Goals Patient Stated Goal: to get her legs stronger PT Goal Formulation: With patient Time For Goal Achievement: 06/01/15 Potential to Achieve Goals: Good    Frequency Min 3X/week    End of Session Equipment Utilized During Treatment: Gait belt;Oxygen Activity Tolerance: Patient limited by fatigue Patient left: in bed;with call bell/phone within reach;with bed alarm set (pt refused to sit in the chair to eat) Nurse Communication: Mobility status (please get pt up OOB later this afternoon)         Time: [id number] PT Time Calculation (min) (ACUTE ONLY): 30 min   Charges:   PT Evaluation $PT Eval Moderate Complexity: 1 Procedure PT Treatments $Gait Training: 8-22 mins        Lusia Greis B. [place], [place], DPT [phone]   05/18/2015, 2:06 PM

## 2015-05-18 NOTE — Progress Notes (Signed)
[facility]                                                                              Patient Demographics  Dana [name], is a 82 y.o. female, DOB - [birth year], [id number]  Admit date - 05/15/2015   Admitting Physician Albertine Patricia, MD  Outpatient Primary MD for the patient is Reginia Naas, MD  LOS - 3   Chief Complaint  Patient presents with  . Shortness of Breath      HPI on 05/15/15 by Ms. Sharene Butters, PA Dana [name] is a 82 y.o. female Recently hospitalized from 2/2- 04/29/2015 with acute respiratory failure with hypoxia due to acute combined systolic and diastolic CHF, complicated with enterococcus UTI, brought by ambulance from her nursing home with worsening SOB, worse on ambulation. She had recently finished antibiotics course for PNA. She was supposed to have home O2 but had not received the tubing at the time of admission and family dynamics.  She denied any fevers, or chest pain. She denies any leg swelling. There were no changes in her medications. She may have had sick contacts 2 days prior. Patient has intermittent periods of agitation, at which time she does not wish to provide further information. On presentation, She was in obvious respiratory distress, and was febrile, with Tmax at 100. Osats were at 78 RA, increased to 98 on NRB.  She received IVF, Lasix, prn NTG and Levaquin IV  Assessment & Plan   Sepsis secondary to HCAP -Upon admission, patient was tachycardic with tachypnea, leukocytosis -Vital signs have normalized -Chest x-ray showed recurrent pulmonary edema with worsening left basilar opacity, atelectasis versus infiltrate -Continue vancomycin and aztreonam given penicillin allergy -Blood cultures negative to date -Strep pneumonia urine antigen negative, pending legionella urine antigen -Influenza PCR negative, pending respiratory viral panel  Acute respiratory failure with hypoxia -Likely secondary to the  above -Continue supplemental oxygen, neb treatments -Will transition to prednisone today  Chronic combined systolic and diastolic heart failure/severe aortic stenosis -Echocardiogram December 2016 showed an EF of [id number], grade 1 diastolic heart failure -BNP 2294 (chronically elevated?) -Currently does not appear to be volume overloaded -Continue to monitor daily weights, intake and output -Continue Lasix -UOP over past 24hrs 1300cc   Hypokalemia -Likely secondary to diuresis -Will replace and continue to monitor BMP  Hypomagnesemia -Continue Mag-Ox, monitor magnesium level  Hypothyroidism -Continue Synthroid  Hyperlipidemia -Continue statin  Anxiety/depression -Continue Wellbutrin/Valium  Code Status: Full  Family Communication:  None at bedside  Disposition Plan: Admitted. Pending improvement. Expect discharge in 24-48hours. Pending PT  Time Spent in minutes   30 minutes  Procedures  None  Consults   None  DVT Prophylaxis  Lovenox  Lab Results  Component Value Date   PLT 256 05/18/2015    Medications  Scheduled Meds: . aztreonam  1 g Intravenous 3 times per day  . buPROPion  300 mg Oral Daily  . enoxaparin (LOVENOX) injection  40 mg Subcutaneous Q24H  . furosemide  40 mg Intravenous Daily  . guaiFENesin  600 mg Oral BID  . iron polysaccharides  150 mg Oral Daily  . levothyroxine  88 mcg Oral QAC breakfast  .  methylPREDNISolone (SOLU-MEDROL) injection  60 mg Intravenous Q24H  . pantoprazole  40 mg Oral Daily  . simvastatin  20 mg Oral Daily  . sodium chloride flush  3 mL Intravenous Q12H  . vancomycin  1,000 mg Intravenous Q24H   Continuous Infusions:  PRN Meds:.sodium chloride, diazepam, ipratropium-albuterol, sodium chloride flush  Antibiotics    Anti-infectives    Start     Dose/Rate Route Frequency Ordered Stop   05/16/15 1300  vancomycin (VANCOCIN) IVPB 1000 mg/200 mL premix  Status:  Discontinued     1,000 mg 200 mL/hr over 60 Minutes  Intravenous Every 24 hours 05/15/15 1410 05/15/15 1509   05/15/15 1800  vancomycin (VANCOCIN) IVPB 1000 mg/200 mL premix     1,000 mg 200 mL/hr over 60 Minutes Intravenous Every 24 hours 05/15/15 1749     05/15/15 1745  aztreonam (AZACTAM) 1 g in dextrose 5 % 50 mL IVPB     1 g 100 mL/hr over 30 Minutes Intravenous 3 times per day 05/15/15 1742     05/15/15 1515  cefTRIAXone (ROCEPHIN) 1 g in dextrose 5 % 50 mL IVPB  Status:  Discontinued     1 g 100 mL/hr over 30 Minutes Intravenous Every 24 hours 05/15/15 1509 05/15/15 1731   05/15/15 1515  azithromycin (ZITHROMAX) 500 mg in dextrose 5 % 250 mL IVPB  Status:  Discontinued     500 mg 250 mL/hr over 60 Minutes Intravenous Every 24 hours 05/15/15 1509 05/15/15 1731   05/15/15 1415  ceFEPIme (MAXIPIME) 1 g in dextrose 5 % 50 mL IVPB  Status:  Discontinued     1 g 100 mL/hr over 30 Minutes Intravenous Every 12 hours 05/15/15 1409 05/15/15 1509   05/15/15 1400  aztreonam (AZACTAM) 2 g in dextrose 5 % 50 mL IVPB  Status:  Discontinued     2 g 100 mL/hr over 30 Minutes Intravenous 3 times per day 05/15/15 1347 05/15/15 1408   05/15/15 1400  vancomycin (VANCOCIN) IVPB 1000 mg/200 mL premix  Status:  Discontinued     1,000 mg 200 mL/hr over 60 Minutes Intravenous  Once 05/15/15 1349 05/15/15 1509   05/15/15 1300  levofloxacin (LEVAQUIN) IVPB 500 mg     500 mg 100 mL/hr over 60 Minutes Intravenous  Once 05/15/15 1222 05/15/15 1450      Subjective:   Darryl Lent seen and examined today. Patient states she is feeling better. Denies any chest pain, abdominal pain, nausea or vomiting, dizziness or headache. She is upset that the phone and remote are on her left side, she is frantic because she is right handed.   Objective:   Filed Vitals:   05/17/15 1341 05/17/15 2327 05/18/15 0645 05/18/15 1141  BP: 108/54 99/61 107/54 111/56  Pulse: 93 92 90 89  Temp: 98.4 F (36.9 C) 98.2 F (36.8 C) 98 F (36.7 C) 97.5 F (36.4 C)  TempSrc: Oral  Oral Oral Oral  Resp: 20   18  Height:      Weight:   62.732 kg (138 lb 4.8 oz)   SpO2: 98% 98% 96% 99%    Wt Readings from Last 3 Encounters:  05/18/15 62.732 kg (138 lb 4.8 oz)  04/29/15 61.508 kg (135 lb 9.6 oz)  03/04/15 69.128 kg (152 lb 6.4 oz)     Intake/Output Summary (Last 24 hours) at 05/18/15 1319 Last data filed at 05/18/15 1235  Gross per 24 hour  Intake   1110 ml  Output   1300 ml  Net   -190 ml    Exam  General: Well developed, well nourished, NAD  HEENT: NCAT, mucous membranes moist.   Cardiovascular: S1 S2 auscultated, RRR, no murmus  Respiratory: Diminished but clear  Abdomen: Soft, nontender, nondistended, + bowel sounds  Extremities: warm dry without cyanosis clubbing or edema  Neuro: AAOx3, nonfocal  Psych: Anxious  Data Review   Micro Results Recent Results (from the past 240 hour(s))  Culture, blood (routine x 2) Call MD if unable to obtain prior to antibiotics being given     Status: None (Preliminary result)   Collection Time: 05/15/15  4:20 PM  Result Value Ref Range Status   Specimen Description BLOOD RIGHT ANTECUBITAL  Final   Special Requests BOTTLES DRAWN AEROBIC ONLY 10CC  Final   Culture NO GROWTH 2 DAYS  Final   Report Status PENDING  Incomplete  Culture, blood (routine x 2) Call MD if unable to obtain prior to antibiotics being given     Status: None (Preliminary result)   Collection Time: 05/15/15  4:27 PM  Result Value Ref Range Status   Specimen Description BLOOD RIGHT HAND  Final   Special Requests IN PEDIATRIC BOTTLE 4CC  Final   Culture NO GROWTH 2 DAYS  Final   Report Status PENDING  Incomplete  Urine culture     Status: None (Preliminary result)   Collection Time: 05/15/15  4:45 PM  Result Value Ref Range Status   Specimen Description URINE, CLEAN CATCH  Final   Special Requests NONE  Final   Culture   Final    80,000 COLONIES/ml ENTEROCOCCUS SPECIES REPEATING SENSITIVITIES    Report Status PENDING   Incomplete  MRSA PCR Screening     Status: None   Collection Time: 05/15/15  7:07 PM  Result Value Ref Range Status   MRSA by PCR NEGATIVE NEGATIVE Final    Comment:        The GeneXpert MRSA Assay (FDA approved for NASAL specimens only), is one component of a comprehensive MRSA colonization surveillance program. It is not intended to diagnose MRSA infection nor to guide or monitor treatment for MRSA infections.     Radiology Reports Dg Chest 2 View  04/23/2015  CLINICAL DATA:  Worsening shortness of Breath EXAM: CHEST  2 VIEW COMPARISON:  03/04/2015 FINDINGS: Cardiomediastinal silhouette is stable. Tortuous ascending aorta with atherosclerotic calcifications are noted. Hiatal hernia again noted. Again noted bilateral interstitial prominence suspicious for interstitial edema or pneumonitis. There is trace left pleural effusion with left basilar atelectasis or infiltrate. IMPRESSION: Hiatal hernia again noted. Again noted bilateral interstitial prominence suspicious for interstitial edema or pneumonitis. There is trace left pleural effusion with left basilar atelectasis or infiltrate. Electronically Signed   By: Lahoma Crocker M.D.   On: 04/23/2015 12:57   Dg Chest Portable 1 View  05/15/2015  CLINICAL DATA:  Shortness of breath EXAM: PORTABLE CHEST 1 VIEW COMPARISON:  04/24/2015; 04/23/2015; 05/04/2014 ; 03/14/2014 FINDINGS: Grossly unchanged enlarged cardiac silhouette and mediastinal contours with atherosclerotic plaque within a tortuous thoracic aorta. There is persistent rightward deviation of the tracheal air column at the level of the aortic arch, potentially accentuated due to patient rotation. Worsening left basilar heterogeneous opacities. Pulmonary vascular remains indistinct with cephalization of flow, most conspicuous in the right upper and mid lung, potentially accentuated due to patient rotation. Trace pleural effusions are not excluded. No pneumothorax. Unchanged bones. IMPRESSION:  Findings worrisome for residual / recurrent pulmonary edema with worsening left basilar opacities, atelectasis  versus infiltrate. Further evaluation with a PA and lateral chest radiograph may be obtained as clinically indicated. Electronically Signed   By: Sandi Mariscal M.D.   On: 05/15/2015 12:02   Dg Chest Port 1 View  04/24/2015  CLINICAL DATA:  CHF EXAM: PORTABLE CHEST 1 VIEW COMPARISON:  04/23/2015 FINDINGS: Cardiac shadow is stable. Aortic calcifications are again identified. Changes consistent with congestive failure are again noted. No focal confluent infiltrate is seen. No sizable effusion is noted. No bony abnormality is seen. IMPRESSION: Stable changes consistent with CHF. Electronically Signed   By: Inez Catalina M.D.   On: 04/24/2015 07:40    CBC  Recent Labs Lab 05/15/15 1133 05/15/15 1148 05/15/15 1610 05/16/15 0825 05/18/15 0343  WBC 16.0*  --  15.6* 8.5 12.2*  HGB 11.2* 12.2 12.3 10.6* 10.1*  HCT 32.9* 36.0 36.8 31.7* 31.9*  PLT 280  --  279 243 256  MCV 91.1  --  91.3 90.8 92.5  MCH 31.0  --  30.5 30.4 29.3  MCHC 34.0  --  33.4 33.4 31.7  RDW 14.4  --  14.3 14.6 14.6  LYMPHSABS  --   --   --  2.1  --   MONOABS  --   --   --  0.7  --   EOSABS  --   --   --  0.2  --   BASOSABS  --   --   --  0.0  --     Chemistries   Recent Labs Lab 05/15/15 1133 05/15/15 1148 05/16/15 0825 05/18/15 0343  NA 141 142 136 137  K 3.3* 3.3* 3.1* 4.4  CL 105 101 98* 102  CO2 23  --  26 26  GLUCOSE 141* 140* 112* 110*  BUN 12 14 14  24*  CREATININE 0.88 0.80 0.79 0.87  CALCIUM 9.1  --  8.7* 8.4*  MG  --   --  1.8 1.9  AST 17  --  16  --   ALT 10*  --  9*  --   ALKPHOS 60  --  53  --   BILITOT 0.4  --  0.4  --    ------------------------------------------------------------------------------------------------------------------ estimated creatinine clearance is 39.1 mL/min (by C-G formula based on Cr of  0.87). ------------------------------------------------------------------------------------------------------------------ No results for input(s): HGBA1C in the last 72 hours. ------------------------------------------------------------------------------------------------------------------ No results for input(s): CHOL, HDL, LDLCALC, TRIG, CHOLHDL, LDLDIRECT in the last 72 hours. ------------------------------------------------------------------------------------------------------------------ No results for input(s): TSH, T4TOTAL, T3FREE, THYROIDAB in the last 72 hours.  Invalid input(s): FREET3 ------------------------------------------------------------------------------------------------------------------ No results for input(s): VITAMINB12, FOLATE, FERRITIN, TIBC, IRON, RETICCTPCT in the last 72 hours.  Coagulation profile No results for input(s): INR, PROTIME in the last 168 hours.  No results for input(s): DDIMER in the last 72 hours.  Cardiac Enzymes No results for input(s): CKMB, TROPONINI, MYOGLOBIN in the last 168 hours.  Invalid input(s): CK ------------------------------------------------------------------------------------------------------------------ Invalid input(s): POCBNP    Kristia Jupiter D.O. on 05/18/2015 at 1:19 PM  Between 7am to 7pm - Pager - [phone]  After 7pm go to [email or web address] - password [email or web address]  And look for the night coverage person covering for me after hours  [facility] Office  [phone]

## 2015-05-19 ENCOUNTER — Other Ambulatory Visit: Payer: Self-pay | Admitting: *Deleted

## 2015-05-19 ENCOUNTER — Ambulatory Visit: Payer: Self-pay | Admitting: *Deleted

## 2015-05-19 LAB — RESPIRATORY VIRUS PANEL
Adenovirus: NEGATIVE
INFLUENZA A: NEGATIVE
INFLUENZA B 1: NEGATIVE
Metapneumovirus: NEGATIVE
PARAINFLUENZA 2 A: NEGATIVE
PARAINFLUENZA 3 A: NEGATIVE
Parainfluenza 1: NEGATIVE
Respiratory Syncytial Virus A: NEGATIVE
Respiratory Syncytial Virus B: NEGATIVE
Rhinovirus: NEGATIVE

## 2015-05-19 LAB — CBC
HEMATOCRIT: 33.4 % — AB (ref 36.0–46.0)
HEMOGLOBIN: 11 g/dL — AB (ref 12.0–15.0)
MCH: 30.6 pg (ref 26.0–34.0)
MCHC: 32.9 g/dL (ref 30.0–36.0)
MCV: 93 fL (ref 78.0–100.0)
Platelets: 259 10*3/uL (ref 150–400)
RBC: 3.59 MIL/uL — AB (ref 3.87–5.11)
RDW: 14.6 % (ref 11.5–15.5)
WBC: 10.2 10*3/uL (ref 4.0–10.5)

## 2015-05-19 LAB — URINE CULTURE: Culture: 80000

## 2015-05-19 LAB — BASIC METABOLIC PANEL
ANION GAP: 11 (ref 5–15)
BUN: 23 mg/dL — AB (ref 6–20)
CHLORIDE: 102 mmol/L (ref 101–111)
CO2: 26 mmol/L (ref 22–32)
Calcium: 8.8 mg/dL — ABNORMAL LOW (ref 8.9–10.3)
Creatinine, Ser: 0.81 mg/dL (ref 0.44–1.00)
GFR calc Af Amer: >60 mL/min (ref >60)
GFR calc non Af Amer: >60 mL/min (ref >60)
GLUCOSE: 96 mg/dL (ref 65–99)
POTASSIUM: 4 mmol/L (ref 3.5–5.1)
Sodium: 139 mmol/L (ref 135–145)

## 2015-05-19 MED ORDER — PREDNISONE 20 MG PO TABS: ORAL_TABLET | ORAL | Status: DC

## 2015-05-19 MED ORDER — CEFUROXIME AXETIL 250 MG PO TABS
250.0000 mg | ORAL_TABLET | Freq: Two times a day (BID) | ORAL | Status: DC
Start: 2015-05-19 — End: 2015-06-02

## 2015-05-19 NOTE — Progress Notes (Signed)
TCT patient's daughter [name], she has arranged [place] services through [facility] and request to cancel services with [facility] to be on hold for now. Butch Penny with [facility] called for arrangements, they will see the patient and [name] stated that she will the [facility] look at the home oxygen to determine who she have it with and notify the company regarding tubing too long and how to effectively use it. Mindi Slicker [facility] [phone]

## 2015-05-19 NOTE — Patient Outreach (Addendum)
[facility]) Care Management  05/19/2015  MARCHAE HAGERTY 04-13-1927 [id number]   Assessment: Transition of care follow-up- week 4- cancelled due to hospitalization Unable to receive a call back from son [name]) to give update on decision made about [facility] services for patient. Unable to receive a call back from primary care provider's office Tito Dine- referral department) verifying/ coordinating with provider about service needs of patient.  Transition of care follow-up call scheduled today was cancelled due to patient's admission to the hospital (05/15/15) for worsening short of breath/ congestive heart failure.   Plan: Will follow-up with patient and family after hospital discharge for transition of care need. Care coordination with hospital liaison of patient's update/ status. Care coordination with primary care provider's office about services needed by patient.   Renessa Wellnitz A. Lovelle Lema, BSN, RN-BC [facility] Management Coordinator Cell: [phone]

## 2015-05-19 NOTE — Discharge Instructions (Signed)
Respiratory failure is when your lungs are not working well and your breathing (respiratory) system fails. When respiratory failure occurs, it is difficult for your lungs to get enough oxygen, get rid of carbon dioxide, or both. Respiratory failure can be life threatening.  °Respiratory failure can be acute or chronic. Acute respiratory failure is sudden, severe, and requires emergency medical treatment. Chronic respiratory failure is less severe, happens over time, and requires ongoing treatment.  °WHAT ARE THE CAUSES OF ACUTE RESPIRATORY FAILURE?  °Any problem affecting the heart or lungs can cause acute respiratory failure. Some of these causes include the following: °· Chronic bronchitis and emphysema (COPD).   °· Blood clot going to a lung (pulmonary embolism).   °· Having water in the lungs caused by heart failure, lung injury, or infection (pulmonary edema).   °· Collapsed lung (pneumothorax).   °· Pneumonia.   °· Pulmonary fibrosis.   °· Obesity.   °· Asthma.   °· Heart failure.   °· Any type of trauma to the chest that can make breathing difficult.   °· Nerve or muscle diseases making chest movements difficult. °HOW WILL MY ACUTE RESPIRATORY FAILURE BE TREATED?  °Treatment of acute respiratory failure depends on the cause of the respiratory failure. Usually, you will stay in the intensive care unit so your breathing can be watched closely. Treatment can include the following: °· Oxygen. Oxygen can be delivered through the following: °· Nasal cannula. This is small tubing that goes in your nose to give you oxygen. °· Face mask. A face mask covers your nose and mouth to give you oxygen. °· Medicine. Different medicines can be given to help with breathing. These can include: °· Nebulizers. Nebulizers deliver medicines to open the air passages (bronchodilators). These medicines help to open or relax the airways in the lungs so you can breathe better. They can also help loosen mucus from your  lungs. °· Diuretics. Diuretic medicines can help you breathe better by getting rid of extra water in your body. °· Steroids. Steroid medicines can help decrease swelling (inflammation) in your lungs. °· Antibiotics. °· Chest tube. If you have a collapsed lung (pneumothorax), a chest tube is placed to help reinflate the lung. °· Noninvasive positive pressure ventilation (NPPV). This is a tight-fitting mask that goes over your nose and mouth. The mask has tubing that is attached to a machine. The machine blows air into the tubing, which helps to keep the tiny air sacs (alveoli) in your lungs open. This machine allows you to breathe on your own. °· Ventilator. A ventilator is a breathing machine. When on a ventilator, a breathing tube is put into the lungs. A ventilator is used when you can no longer breathe well enough on your own. You may have low oxygen levels or high carbon dioxide (CO2) levels in your blood. When you are on a ventilator, sedation and pain medicines are given to make you sleep so your lungs can heal. °SEEK IMMEDIATE MEDICAL CARE IF: °· You have shortness of breath (dyspnea) with or without activity. °· You have rapid breathing (tachypnea). °· You are wheezing. °· You are unable to say more than a few words without having to catch your breath. °· You find it very difficult to function normally. °· You have a fast heart rate. °· You have a bluish color to your finger or toe nail beds. °· You have confusion or drowsiness or both. °  °This information is not intended to replace advice given to you by your health care provider. Make sure you discuss   any questions you have with your health care provider. °  °Document Released: 03/12/2013 Document Revised: 11/26/2014 Document Reviewed: 03/12/2013 °Elsevier Interactive Patient Education ©2016 [employer] °Community-Acquired Pneumonia, Adult °Pneumonia is an infection of the lungs. There are different types of pneumonia. One type can develop while a  person is in a hospital. A different type, called community-acquired pneumonia, develops in people who are not, or have not recently been, in the hospital or other health care facility.  °CAUSES °Pneumonia may be caused by bacteria, viruses, or funguses. Community-acquired pneumonia is often caused by Streptococcus pneumonia bacteria. These bacteria are often passed from one person to another by breathing in droplets from the cough or sneeze of an infected person. °RISK FACTORS °The condition is more likely to develop in: °· People who have chronic diseases, such as chronic obstructive pulmonary disease (COPD), asthma, congestive heart failure, cystic fibrosis, diabetes, or kidney disease. °· People who have early-stage or late-stage HIV. °· People who have sickle cell disease. °· People who have had their spleen removed (splenectomy). °· People who have poor dental hygiene. °· People who have medical conditions that increase the risk of breathing in (aspirating) secretions their own mouth and nose.   °· People who have a weakened immune system (immunocompromised). °· People who smoke. °· People who travel to areas where pneumonia-causing germs commonly exist. °· People who are around animal habitats or animals that have pneumonia-causing germs, including birds, bats, rabbits, cats, and farm animals. °SYMPTOMS °Symptoms of this condition include: °· A dry cough. °· A wet (productive) cough. °· Fever. °· Sweating. °· Chest pain, especially when breathing deeply or coughing. °· Rapid breathing or difficulty breathing. °· Shortness of breath. °· Shaking chills. °· Fatigue. °· Muscle aches. °DIAGNOSIS °Your health care provider will take a medical history and perform a physical exam. You may also have other tests, including: °· Imaging studies of your chest, including X-rays. °· Tests to check your blood oxygen level and other blood gases. °· Other tests on blood, mucus (sputum), fluid around your lungs (pleural fluid),  and urine. °If your pneumonia is severe, other tests may be done to identify the specific cause of your illness. °TREATMENT °The type of treatment that you receive depends on many factors, such as the cause of your pneumonia, the medicines you take, and other medical conditions that you have. For most adults, treatment and recovery from pneumonia may occur at home. In some cases, treatment must happen in a hospital. Treatment may include: °· Antibiotic medicines, if the pneumonia was caused by bacteria. °· Antiviral medicines, if the pneumonia was caused by a virus. °· Medicines that are given by mouth or through an IV tube. °· Oxygen. °· Respiratory therapy. °Although rare, treating severe pneumonia may include: °· Mechanical ventilation. This is done if you are not breathing well on your own and you cannot maintain a safe blood oxygen level. °· Thoracentesis. This procedure removes fluid around one lung or both lungs to help you breathe better. °HOME CARE INSTRUCTIONS °· Take over-the-counter and prescription medicines only as told by your health care provider. °¨ Only take cough medicine if you are losing sleep. Understand that cough medicine can prevent your body's natural ability to remove mucus from your lungs. °¨ If you were prescribed an antibiotic medicine, take it as told by your health care provider. Do not stop taking the antibiotic even if you start to feel better. °· Sleep in a semi-upright position at night. Try sleeping in a reclining chair, or place a few pillows under your head. °· Do   not use tobacco products, including cigarettes, chewing tobacco, and e-cigarettes. If you need help quitting, ask your health care provider. °· Drink enough water to keep your urine clear or pale yellow. This will help to thin out mucus secretions in your lungs. °PREVENTION °There are ways that you can decrease your risk of developing community-acquired pneumonia. Consider getting a pneumococcal vaccine if: °· You are  older than 80 years of age. °· You are older than 80 years of age and are undergoing cancer treatment, have chronic lung disease, or have other medical conditions that affect your immune system. Ask your health care provider if this applies to you. °There are different types and schedules of pneumococcal vaccines. Ask your health care provider which vaccination option is best for you. °You may also prevent community-acquired pneumonia if you take these actions: °· Get an influenza vaccine every year. Ask your health care provider which type of influenza vaccine is best for you. °· Go to the dentist on a regular basis. °· Wash your hands often. Use hand sanitizer if soap and water are not available. °SEEK MEDICAL CARE IF: °· You have a fever. °· You are losing sleep because you cannot control your cough with cough medicine. °SEEK IMMEDIATE MEDICAL CARE IF: °· You have worsening shortness of breath. °· You have increased chest pain. °· Your sickness becomes worse, especially if you are an older adult or have a weakened immune system. °· You cough up blood. °  °This information is not intended to replace advice given to you by your health care provider. Make sure you discuss any questions you have with your health care provider. °  °Document Released: 03/07/2005 Document Revised: 11/26/2014 Document Reviewed: 07/02/2014 °Elsevier Interactive Patient Education ©2016 [employer] ° °

## 2015-05-19 NOTE — Care Management Note (Signed)
Case Management Note  Patient Details  Name: Dana [name] MRN: [id number] Date of Birth: Feb 11, 1928  Subjective/Objective:  Admitted with Acute Resp failure                  Action/Plan: Patient resides at [facility] and is active with [facility] side for Outpatient OT; Patient request to continue to go to outpatient therapy. [facility] - orders faxed as requested. She also has home oxygen but has lots of questions about it. Currently checking to see who she obtained oxygen through so that they can go to her home and check the oxygen. Expected Discharge Date:    05/19/2015               Expected Discharge Plan:  Home/Self Care  Discharge planning Services  CM Consult    Choice offered to:  Patient  Status of Service:  In process, will continue to follow  Sherrilyn Rist [id number] 05/19/2015, 11:40 AM

## 2015-05-19 NOTE — Discharge Summary (Signed)
Physician Discharge Summary  Dana [name] [id number] DOB: 09-22-1927 DOA: 05/15/2015  PCP: Reginia Naas, MD  Admit date: 05/15/2015 Discharge date: 05/19/2015  Time spent: 45 minutes  Recommendations for Outpatient Follow-up:  Patient will be discharged to home with home health and oxygen.  Patient will need to follow up with primary care provider within one week of discharge.  Patient should continue medications as prescribed.  Patient should follow a heart healthy diet.   It was recommended again that patient be discharged to skilled nursing facility but she refused again.   Patient is at high risk for readmission secondary to her comorbidities and refusal to go to SNF.  Discharge Diagnoses:  Sepsis secondary to HCAP Acute respiratory failure with hypoxia Chronic combined systolic and diastolic heart failure/severe aortic stenosis Hypokalemia Hypomagnesemia Hypothyroidism Hyperlipidemia Anxiety/depression Asymptomatic bacteruria   Discharge Condition: Stable  Diet recommendation: Heart healthy  Filed Weights   05/17/15 0502 05/18/15 0645 05/19/15 0408  Weight: 62.052 kg (136 lb 12.8 oz) 62.732 kg (138 lb 4.8 oz) 62.551 kg (137 lb 14.4 oz)    History of present illness:  on 05/15/15 by Ms. Sharene Butters, PA Dana [name] is a 82 y.o. female Recently hospitalized from 2/2- 04/29/2015 with acute respiratory failure with hypoxia due to acute combined systolic and diastolic CHF, complicated with enterococcus UTI, brought by ambulance from her nursing home with worsening SOB, worse on ambulation. She had recently finished antibiotics course for PNA. She was supposed to have home O2 but had not received the tubing at the time of admission and family dynamics.  She denied any fevers, or chest pain. She denies any leg swelling. There were no changes in her medications. She may have had sick contacts 2 days prior. Patient has intermittent periods of agitation, at which  time she does not wish to provide further information. On presentation, She was in obvious respiratory distress, and was febrile, with Tmax at 100. Osats were at 78 RA, increased to 98 on NRB.  She received IVF, Lasix, prn NTG and Levaquin IV  Hospital Course:  Sepsis secondary to HCAP -Upon admission, patient was tachycardic with tachypnea, leukocytosis -Vital signs have normalized, leukocytosis resolved -Chest x-ray showed recurrent pulmonary edema with worsening left basilar opacity, atelectasis versus infiltrate -initially placed on vancomycin and aztreonam given penicillin allergy -Blood cultures negative to date -Strep pneumonia and legionella urine antigens negative -Influenza PCR negative, pending respiratory viral panel- may be followed by PCP -Will discharge patient with ceftin 250mg  BID for additional 3 days  Acute respiratory failure with hypoxia -Likely secondary to the above -Continue supplemental oxygen, neb treatments -Continue oral prednisone taper  Chronic combined systolic and diastolic heart failure/severe aortic stenosis -Echocardiogram December 2016 showed an EF of [id number], grade 1 diastolic heart failure -BNP 2294 (chronically elevated?) -Currently does not appear to be volume overloaded -Continue to monitor daily weights, intake and output -Continue Lasix -UOP over past 24hrs 1400cc (net -560)  Hypokalemia -Resolved, Likely secondary to diuresis  Hypomagnesemia -Continue Mag-Ox  Hypothyroidism -Continue Synthroid  Hyperlipidemia -Continue statin  Anxiety/depression -Continue Wellbutrin/Valium  Asymptomatic bacteruria  -UA unremarkable -Urine culture: 80K colonies VRE -Patient likely a colonizer, currently asymptomatic   Physical deconditioning -PT consulted and recommended skilled nursing facility -Patient continues to refuse. We'll send with home health  Procedures  None  Consults  None  Discharge Exam: Filed Vitals:   05/18/15  1948 05/19/15 0408  BP: 125/63 117/63  Pulse: 100 96  Temp:  97.7 F (36.5  C)  Resp: 20 18    Exam  General: Well developed, elderly, NAD  HEENT: NCAT, mucous membranes moist.   Cardiovascular: S1 S2 auscultated, RRR, no murmus  Respiratory: Diminished but clear  Abdomen: Soft, nontender, nondistended, + bowel sounds  Extremities: warm dry without cyanosis clubbing or edema  Neuro: AAOx3, nonfocal  Discharge Instructions      Discharge Instructions    Discharge instructions    Complete by:  As directed   Patient will be discharged to home with home health and oxygen.  Patient will need to follow up with primary care provider within one week of discharge.  Patient should continue medications as prescribed.  Patient should follow a heart healthy diet.            Medication List    STOP taking these medications        hydrOXYzine 10 MG tablet  Commonly known as:  ATARAX/VISTARIL     potassium chloride 10 MEQ tablet  Commonly known as:  K-DUR      TAKE these medications        calcium citrate 950 MG tablet  Commonly known as:  CALCITRATE - dosed in mg elemental calcium  Take 200 mg of elemental calcium by mouth daily.     cefUROXime 250 MG tablet  Commonly known as:  CEFTIN  Take 1 tablet (250 mg total) by mouth 2 (two) times daily with a meal.     diazepam 5 MG tablet  Commonly known as:  VALIUM  Take 10 mg by mouth every 6 (six) hours as needed for anxiety.     iron polysaccharides 150 MG capsule  Commonly known as:  NIFEREX  Take 150 mg by mouth daily.     MIRALAX powder  Generic drug:  polyethylene glycol powder  Take 1 Container by mouth daily as needed for mild constipation.     omeprazole 40 MG capsule  Commonly known as:  PRILOSEC  Take 40 mg by mouth daily.     predniSONE 20 MG tablet  Commonly known as:  DELTASONE  Take 40mg  (4 tabs) x 2 days, then taper to 30mg  (3 tabs) x 2 days, then 20mg  (2 tabs) x 2 days, then 10mg  (1 tab) x 2  days, then OFF.     simvastatin 20 MG tablet  Commonly known as:  ZOCOR  Take 20 mg by mouth daily.     SYNTHROID 88 MCG tablet  Generic drug:  levothyroxine  Take 88 mcg by mouth daily before breakfast.     WELLBUTRIN XL 150 MG 24 hr tablet  Generic drug:  buPROPion  Take 300 mg by mouth daily.       Allergies  Allergen Reactions  . Amoxicillin Other (See Comments)    Excessive sweating   . Amoxicillin-Pot Clavulanate Other (See Comments)  . Azithromycin Other (See Comments)    Numbness/pain in fingers  . Celexa [Citalopram Hydrobromide]     unknown  . Iohexol      Desc: will need premedicated   . Pollen Extract   . Prozac [Fluoxetine Hcl]     unknown  . Levaquin [Levofloxacin In D5w] Rash  . Promethazine Other (See Comments)    Restlessness    . Shrimp [Shellfish Allergy] Itching   Follow-up Information    Follow up with Reginia Naas, MD. Schedule an appointment as soon as possible for a visit in 1 week.   Specialty:  Family Medicine   Why:  Hospital follow up  Contact information:   [address] [place] [address] [phone]        The results of significant diagnostics from this hospitalization (including imaging, microbiology, ancillary and laboratory) are listed below for reference.    Significant Diagnostic Studies: Dg Chest 2 View  04/23/2015  CLINICAL DATA:  Worsening shortness of Breath EXAM: CHEST  2 VIEW COMPARISON:  03/04/2015 FINDINGS: Cardiomediastinal silhouette is stable. Tortuous ascending aorta with atherosclerotic calcifications are noted. Hiatal hernia again noted. Again noted bilateral interstitial prominence suspicious for interstitial edema or pneumonitis. There is trace left pleural effusion with left basilar atelectasis or infiltrate. IMPRESSION: Hiatal hernia again noted. Again noted bilateral interstitial prominence suspicious for interstitial edema or pneumonitis. There is trace left pleural effusion with  left basilar atelectasis or infiltrate. Electronically Signed   By: Lahoma Crocker M.D.   On: 04/23/2015 12:57   Dg Chest Portable 1 View  05/15/2015  CLINICAL DATA:  Shortness of breath EXAM: PORTABLE CHEST 1 VIEW COMPARISON:  04/24/2015; 04/23/2015; 05/04/2014 ; 03/14/2014 FINDINGS: Grossly unchanged enlarged cardiac silhouette and mediastinal contours with atherosclerotic plaque within a tortuous thoracic aorta. There is persistent rightward deviation of the tracheal air column at the level of the aortic arch, potentially accentuated due to patient rotation. Worsening left basilar heterogeneous opacities. Pulmonary vascular remains indistinct with cephalization of flow, most conspicuous in the right upper and mid lung, potentially accentuated due to patient rotation. Trace pleural effusions are not excluded. No pneumothorax. Unchanged bones. IMPRESSION: Findings worrisome for residual / recurrent pulmonary edema with worsening left basilar opacities, atelectasis versus infiltrate. Further evaluation with a PA and lateral chest radiograph may be obtained as clinically indicated. Electronically Signed   By: Sandi Mariscal M.D.   On: 05/15/2015 12:02   Dg Chest Port 1 View  04/24/2015  CLINICAL DATA:  CHF EXAM: PORTABLE CHEST 1 VIEW COMPARISON:  04/23/2015 FINDINGS: Cardiac shadow is stable. Aortic calcifications are again identified. Changes consistent with congestive failure are again noted. No focal confluent infiltrate is seen. No sizable effusion is noted. No bony abnormality is seen. IMPRESSION: Stable changes consistent with CHF. Electronically Signed   By: Inez Catalina M.D.   On: 04/24/2015 07:40    Microbiology: Recent Results (from the past 240 hour(s))  Culture, blood (routine x 2) Call MD if unable to obtain prior to antibiotics being given     Status: None (Preliminary result)   Collection Time: 05/15/15  4:20 PM  Result Value Ref Range Status   Specimen Description BLOOD RIGHT ANTECUBITAL  Final     Special Requests BOTTLES DRAWN AEROBIC ONLY 10CC  Final   Culture NO GROWTH 3 DAYS  Final   Report Status PENDING  Incomplete  Culture, blood (routine x 2) Call MD if unable to obtain prior to antibiotics being given     Status: None (Preliminary result)   Collection Time: 05/15/15  4:27 PM  Result Value Ref Range Status   Specimen Description BLOOD RIGHT HAND  Final   Special Requests IN PEDIATRIC BOTTLE 4CC  Final   Culture NO GROWTH 3 DAYS  Final   Report Status PENDING  Incomplete  Urine culture     Status: None   Collection Time: 05/15/15  4:45 PM  Result Value Ref Range Status   Specimen Description URINE, CLEAN CATCH  Final   Special Requests NONE  Final   Culture   Final    80,000 COLONIES/ml VANCOMYCIN RESISTANT ENTEROCOCCUS   Report Status 05/19/2015 FINAL  Final   Organism ID, Bacteria VANCOMYCIN RESISTANT ENTEROCOCCUS  Final      Susceptibility   Vancomycin resistant enterococcus - MIC*    AMPICILLIN >=32 RESISTANT Resistant     LEVOFLOXACIN >=8 RESISTANT Resistant     NITROFURANTOIN 256 RESISTANT Resistant     VANCOMYCIN >=32 RESISTANT Resistant     LINEZOLID 2 SENSITIVE Sensitive     * 80,000 COLONIES/ml VANCOMYCIN RESISTANT ENTEROCOCCUS  MRSA PCR Screening     Status: None   Collection Time: 05/15/15  7:07 PM  Result Value Ref Range Status   MRSA by PCR NEGATIVE NEGATIVE Final    Comment:        The GeneXpert MRSA Assay (FDA approved for NASAL specimens only), is one component of a comprehensive MRSA colonization surveillance program. It is not intended to diagnose MRSA infection nor to guide or monitor treatment for MRSA infections.      Labs: Basic Metabolic Panel:  Recent Labs Lab 05/15/15 1133 05/15/15 1148 05/16/15 0825 05/18/15 0343 05/19/15 0628  NA 141 142 136 137 139  K 3.3* 3.3* 3.1* 4.4 4.0  CL 105 101 98* 102 102  CO2 23  --  26 26 26   GLUCOSE 141* 140* 112* 110* 96  BUN 12 14 14  24* 23*  CREATININE 0.88 0.80 0.79 0.87 0.81   CALCIUM 9.1  --  8.7* 8.4* 8.8*  MG  --   --  1.8 1.9  --    Liver Function Tests:  Recent Labs Lab 05/15/15 1133 05/16/15 0825  AST 17 16  ALT 10* 9*  ALKPHOS 60 53  BILITOT 0.4 0.4  PROT 6.1* 5.7*  ALBUMIN 3.1* 2.7*   No results for input(s): LIPASE, AMYLASE in the last 168 hours. No results for input(s): AMMONIA in the last 168 hours. CBC:  Recent Labs Lab 05/15/15 1133 05/15/15 1148 05/15/15 1610 05/16/15 0825 05/18/15 0343 05/19/15 0628  WBC 16.0*  --  15.6* 8.5 12.2* 10.2  NEUTROABS  --   --   --  5.5  --   --   HGB 11.2* 12.2 12.3 10.6* 10.1* 11.0*  HCT 32.9* 36.0 36.8 31.7* 31.9* 33.4*  MCV 91.1  --  91.3 90.8 92.5 93.0  PLT 280  --  279 243 256 259   Cardiac Enzymes: No results for input(s): CKTOTAL, CKMB, CKMBINDEX, TROPONINI in the last 168 hours. BNP: BNP (last 3 results)  Recent Labs  04/23/15 1325 04/24/15 0357 05/15/15 1133  BNP 2532.8* 2132.5* 2294.6*    ProBNP (last 3 results) No results for input(s): PROBNP in the last 8760 hours.  CBG: No results for input(s): GLUCAP in the last 168 hours.     SignedCristal Ford  [facility] 05/19/2015, 11:32 AM

## 2015-05-19 NOTE — Consult Note (Signed)
[facility] CM Inpatient Consult   05/19/2015  BLANCH STANG 1928-03-04 [id number] Met with the patient at bedside. Spoke with the patient regarding the difficulty the [facility] [facility] nurse has had to try and reach her by telephone.  Patient states she remembers a lot of calls when she had gotten out of the hospital her last admission this month.  She states she wasn't sure if it was someone just trying to get her information.  She states she has had difficulty in the past with being taken advantage of at a skilled facility she says and did not want to put herself in that situation again. She states she was not able to find or see the paperwork that she was asked about when she was called;  and she wasn't sure she should give out that information.  She states she is willing to continue services with [facility] but with her problems seeing and hearing she gets frustrated.  She is willing for the [place] [facility] [facility] staff to possibly meet her at one of her post hospital doctor's office visits or make arrangements with her daughter.  Patient states that her daughter, [name], struggles with her own medical issues as well.  She states her son, [name], works during the day and is very busy at a Technical sales engineer.  Patient states she feels she would not do well anywhere else other than her apartment because she has difficulty with change since she has lost her vision.  Patient expresses concerns about wearing oxygen and using a walker as well. She is not sure who her oxygen is supplied by.   Will notify [place] of the patient's barriers when returning home.  For questions, please contact: Natividad Brood, RN BSN [facility] Liaison  [phone] business mobile phone Toll free office [phone]

## 2015-05-20 ENCOUNTER — Other Ambulatory Visit: Payer: Self-pay | Admitting: *Deleted

## 2015-05-20 LAB — CULTURE, BLOOD (ROUTINE X 2)
Culture: NO GROWTH
Culture: NO GROWTH

## 2015-05-20 NOTE — Patient Outreach (Signed)
[facility]) Care Management  05/20/2015  SHERIKA SCHROM [birth year] [id number]   Assessment: Transition of care- Initial call (after hospital discharge)  82 year old female with recent hospitalization on 2/24- 2/28 for worsening shortness of breath/ healthcare associated pneumonia and congestive heart failure. It was recommended again that patient be discharged to skilled nursing facility but she refused again- per discharge summary. Patient was discharged to home with home health and oxygen on 05/19/15.   Call placed to speak with patient but unable to reach her. HIPAA compliant voice message left with name and contact number.  Plan: Will await for return call. If unable to receive a call back, will schedule for next outreach call.  Tema Alire A. Lizzy Hamre, BSN, RN-BC [place] Management Coordinator Cell: [phone]

## 2015-05-21 ENCOUNTER — Other Ambulatory Visit: Payer: Self-pay | Admitting: *Deleted

## 2015-05-21 NOTE — Patient Outreach (Addendum)
[name] Care Management  05/21/2015  Dana [name] 1927/07/17 [id number]   Assessment: Transition of care call- second attempt Unable to receive a return call from patient to message left yesterday.  Call placed to speak with patient but no answer and unable to reach her. HIPAA compliant voice message left with name and contact number.  Call placed as well to patient's daughter Dana [name]) but not able to reach her. HIPAA compliant voice message left with name and contact information.   Plan: Will await for return call from patient or daughter. If unable to receive a call back, will schedule for next outreach call.    ADDENDUM: Inbound call received from patient's daughter and message left with contact information.  Call placed and spoke with daughter/ emergency contact. Verified identity using two identifiers.  Patient's daughter Dana [name]) notifies care management coordinator that patient is living alone at [facility]. Per daughter's report patient is taking care of herself the best she can to survive since she refused to be placed at a skilled nursing facility. Patient is very hard of hearing and has failing eyesight. She states that patient can be resistive to care at times with memory problems and paranoia.   Verified with daughter if care management coordinator will have to arrange home visit and phone calls with daughter given patient's history but daughter states that she lives far from patient to be receiving messages and she is disabled herself.   Daughter states she will give care management coordinator's phone number to patient and instruct her to keep an eye on that phone number that comes up on her TV screen and daughter will tell patient to answer that specific phone number. Patient's daughter informs care management coordinator that patient has no specific healthcare power of attorney at this time but has a power of attorney for her  finances which is her son Dana [name]).   Patient's daughter reports that patient is currently being seen by [employer] physical and occupational therapy which is an in-house home health at [facility].  [facility] just started providing services since patient "kicked them out" per daughter. Patient used to have [place] who checks patient once a day and provides assistance with her daily care and medications but were "kicked out" by patient as well,  as stated by daughter. Currently, family is working with [facility] in getting them back to provide assistance to patient- as shared by daughter.  [facility] provides one meal a day. Patient makes her own sandwich, soup or makes her own food for the rest of her meals per daughter.  According to daughter, [facility] had been in contact with her and will be seeing patient today for evaluation.  Patient's daughter verbalized that patient needs help/ assistance with her care but can be "handful" and uncooperative at times. Daughter states helping to coax patient to be more receptive and responsive. Patient has scheduled follow-up visits with cardiologist on 3/7 and primary care provider on 3/9. Transportation being provided by her family.   Plan: Will attempt to reach patient and schedule her on next outreach call.  Dana [name], BSN, RN-BC [facility] Management Coordinator Cell: [phone]

## 2015-05-26 ENCOUNTER — Ambulatory Visit: Payer: Medicare Other | Admitting: Interventional Cardiology

## 2015-05-26 ENCOUNTER — Other Ambulatory Visit: Payer: Self-pay | Admitting: *Deleted

## 2015-05-26 NOTE — Patient Outreach (Signed)
[facility]) Care Management  05/26/2015  XYLAH LODICO 1927-08-10 [id number]   Assessment: Transition of care - third attempt Call placed to patient to follow-up transition of care twice- at two different times but unable to reach her. HIPAA compliant voice message left with name and contact information.  Plan: Will await for patient's return call. If unable to receive a return call, will schedule for next outreach call.  Anh Bigos A. Shauntae Reitman, BSN, RN-BC [facility] Management Coordinator Cell: [phone]

## 2015-05-27 ENCOUNTER — Other Ambulatory Visit: Payer: Self-pay | Admitting: *Deleted

## 2015-05-27 NOTE — Patient Outreach (Addendum)
[facility]) Care Management  05/27/2015  Dana [name] [birth year] [id number]   Assessment: Transition of care follow-up call (week 2) Unable to receive a return call from patient to messages left to her voice mailbox inspite instructions from daughter to speak out loud when leaving a message inorder for patient to hear and give time for patient to come to the phone and answer it.   Call placed to speak with patient but still unable to reach her. HIPAA compliant voice message left with name and contact number.  Plan: Will await for a  return call from patient. If unable to receive a call back, will call/ notify patient's daughter of unsuccessful attempts to reach patient.    ADDENDUM:  Received inbound call from patient with message refusing to be called back on the phone. Patient prefers not to be contacted since it bothers her a lot as stated.    Call placed and spoke to patient's daughter and made her aware that patient made her choice of not answering the phone on several occassions that care management coordinator attempted to reach her-- following daughter's instructions. Daughter was also notified of patient's message of not calling her back on the phone and prefers not to be contacted. Patient's daughter expressed her understanding. Daughter states "that's how volatile her behavior is-- you can only do but try".  Daughter also shared that patient cancelled her appointment with her primary care provider today and was rescheduled to 07/01/15.  Her cardiologist appointment is scheduled on 06/22/15.  Patient's daughter was very thankful of the patience provided in trying to accommodate and work with patient and family.   Ms. Dana [name] agreed to close case on patient for now. She is also aware to call [facility] management coordinator or 24-hour nurse line if needed and to ask provider to refer patient back to [facility] care management if services will be needed in the future.  Contact information with daughter.   Plan: Will close case due to difficulty of maintaining contact with patient. Will notify primary care provider of case closure.  Ruben Mahler A. Cassundra Mckeever, BSN, RN-BC [facility] Management Coordinator Cell: [phone]

## 2015-05-28 ENCOUNTER — Emergency Department (HOSPITAL_COMMUNITY): Payer: Medicare Other

## 2015-05-28 ENCOUNTER — Encounter: Payer: Self-pay | Admitting: *Deleted

## 2015-05-28 ENCOUNTER — Inpatient Hospital Stay (HOSPITAL_COMMUNITY)
Admission: EM | Admit: 2015-05-28 | Discharge: 2015-06-02 | DRG: 291 | Disposition: A | Discharge status: Skilled Nursing Facility | Payer: Medicare Other | Attending: Internal Medicine | Admitting: Internal Medicine

## 2015-05-28 ENCOUNTER — Encounter (HOSPITAL_COMMUNITY): Payer: Self-pay | Admitting: Emergency Medicine

## 2015-05-28 DIAGNOSIS — R410 Disorientation, unspecified: Secondary | ICD-10-CM | POA: Diagnosis present

## 2015-05-28 DIAGNOSIS — F329 Major depressive disorder, single episode, unspecified: Secondary | ICD-10-CM | POA: Diagnosis present

## 2015-05-28 DIAGNOSIS — E876 Hypokalemia: Secondary | ICD-10-CM | POA: Diagnosis present

## 2015-05-28 DIAGNOSIS — Z880 Allergy status to penicillin: Secondary | ICD-10-CM | POA: Diagnosis not present

## 2015-05-28 DIAGNOSIS — Z9012 Acquired absence of left breast and nipple: Secondary | ICD-10-CM | POA: Diagnosis not present

## 2015-05-28 DIAGNOSIS — Z9181 History of falling: Secondary | ICD-10-CM | POA: Diagnosis not present

## 2015-05-28 DIAGNOSIS — Z833 Family history of diabetes mellitus: Secondary | ICD-10-CM | POA: Diagnosis not present

## 2015-05-28 DIAGNOSIS — I5043 Acute on chronic combined systolic (congestive) and diastolic (congestive) heart failure: Secondary | ICD-10-CM | POA: Insufficient documentation

## 2015-05-28 DIAGNOSIS — Z9981 Dependence on supplemental oxygen: Secondary | ICD-10-CM | POA: Diagnosis not present

## 2015-05-28 DIAGNOSIS — Z888 Allergy status to other drugs, medicaments and biological substances status: Secondary | ICD-10-CM

## 2015-05-28 DIAGNOSIS — E039 Hypothyroidism, unspecified: Secondary | ICD-10-CM | POA: Diagnosis present

## 2015-05-28 DIAGNOSIS — J810 Acute pulmonary edema: Secondary | ICD-10-CM | POA: Diagnosis not present

## 2015-05-28 DIAGNOSIS — K219 Gastro-esophageal reflux disease without esophagitis: Secondary | ICD-10-CM | POA: Diagnosis present

## 2015-05-28 DIAGNOSIS — Z8711 Personal history of peptic ulcer disease: Secondary | ICD-10-CM | POA: Diagnosis not present

## 2015-05-28 DIAGNOSIS — J9621 Acute and chronic respiratory failure with hypoxia: Secondary | ICD-10-CM | POA: Diagnosis present

## 2015-05-28 DIAGNOSIS — I350 Nonrheumatic aortic (valve) stenosis: Secondary | ICD-10-CM | POA: Diagnosis present

## 2015-05-28 DIAGNOSIS — G8929 Other chronic pain: Secondary | ICD-10-CM | POA: Diagnosis present

## 2015-05-28 DIAGNOSIS — I5042 Chronic combined systolic (congestive) and diastolic (congestive) heart failure: Principal | ICD-10-CM | POA: Diagnosis present

## 2015-05-28 DIAGNOSIS — Z9841 Cataract extraction status, right eye: Secondary | ICD-10-CM | POA: Diagnosis not present

## 2015-05-28 DIAGNOSIS — F4489 Other dissociative and conversion disorders: Secondary | ICD-10-CM | POA: Diagnosis not present

## 2015-05-28 DIAGNOSIS — Z91013 Allergy to seafood: Secondary | ICD-10-CM | POA: Diagnosis not present

## 2015-05-28 DIAGNOSIS — Z8249 Family history of ischemic heart disease and other diseases of the circulatory system: Secondary | ICD-10-CM

## 2015-05-28 DIAGNOSIS — Z85828 Personal history of other malignant neoplasm of skin: Secondary | ICD-10-CM | POA: Diagnosis not present

## 2015-05-28 DIAGNOSIS — I248 Other forms of acute ischemic heart disease: Secondary | ICD-10-CM | POA: Diagnosis present

## 2015-05-28 DIAGNOSIS — E7800 Pure hypercholesterolemia, unspecified: Secondary | ICD-10-CM | POA: Diagnosis present

## 2015-05-28 DIAGNOSIS — J9601 Acute respiratory failure with hypoxia: Secondary | ICD-10-CM | POA: Diagnosis present

## 2015-05-28 DIAGNOSIS — R69 Illness, unspecified: Secondary | ICD-10-CM

## 2015-05-28 DIAGNOSIS — R7989 Other specified abnormal findings of blood chemistry: Secondary | ICD-10-CM | POA: Diagnosis present

## 2015-05-28 DIAGNOSIS — F0390 Unspecified dementia without behavioral disturbance: Secondary | ICD-10-CM | POA: Diagnosis present

## 2015-05-28 DIAGNOSIS — F419 Anxiety disorder, unspecified: Secondary | ICD-10-CM | POA: Diagnosis present

## 2015-05-28 DIAGNOSIS — Z8701 Personal history of pneumonia (recurrent): Secondary | ICD-10-CM

## 2015-05-28 DIAGNOSIS — W19XXXA Unspecified fall, initial encounter: Secondary | ICD-10-CM

## 2015-05-28 DIAGNOSIS — I272 Other secondary pulmonary hypertension: Secondary | ICD-10-CM | POA: Diagnosis present

## 2015-05-28 DIAGNOSIS — Z961 Presence of intraocular lens: Secondary | ICD-10-CM | POA: Diagnosis present

## 2015-05-28 DIAGNOSIS — Z79899 Other long term (current) drug therapy: Secondary | ICD-10-CM | POA: Diagnosis not present

## 2015-05-28 DIAGNOSIS — T502X5A Adverse effect of carbonic-anhydrase inhibitors, benzothiadiazides and other diuretics, initial encounter: Secondary | ICD-10-CM | POA: Diagnosis present

## 2015-05-28 DIAGNOSIS — Z8572 Personal history of non-Hodgkin lymphomas: Secondary | ICD-10-CM | POA: Diagnosis not present

## 2015-05-28 DIAGNOSIS — Z9842 Cataract extraction status, left eye: Secondary | ICD-10-CM

## 2015-05-28 DIAGNOSIS — Z91041 Radiographic dye allergy status: Secondary | ICD-10-CM

## 2015-05-28 DIAGNOSIS — Z66 Do not resuscitate: Secondary | ICD-10-CM | POA: Diagnosis not present

## 2015-05-28 DIAGNOSIS — Z87891 Personal history of nicotine dependence: Secondary | ICD-10-CM | POA: Diagnosis not present

## 2015-05-28 DIAGNOSIS — M549 Dorsalgia, unspecified: Secondary | ICD-10-CM | POA: Diagnosis present

## 2015-05-28 DIAGNOSIS — Z853 Personal history of malignant neoplasm of breast: Secondary | ICD-10-CM | POA: Diagnosis not present

## 2015-05-28 DIAGNOSIS — E785 Hyperlipidemia, unspecified: Secondary | ICD-10-CM | POA: Diagnosis present

## 2015-05-28 DIAGNOSIS — J811 Chronic pulmonary edema: Secondary | ICD-10-CM | POA: Diagnosis present

## 2015-05-28 DIAGNOSIS — Z881 Allergy status to other antibiotic agents status: Secondary | ICD-10-CM | POA: Diagnosis not present

## 2015-05-28 DIAGNOSIS — R778 Other specified abnormalities of plasma proteins: Secondary | ICD-10-CM

## 2015-05-28 LAB — URINALYSIS, ROUTINE W REFLEX MICROSCOPIC
Glucose, UA: NEGATIVE mg/dL
Hgb urine dipstick: NEGATIVE
Ketones, ur: NEGATIVE mg/dL
Leukocytes, UA: NEGATIVE
Nitrite: NEGATIVE
Protein, ur: 30 mg/dL — AB
Specific Gravity, Urine: 1.026 (ref 1.005–1.030)
pH: 6 (ref 5.0–8.0)

## 2015-05-28 LAB — COMPREHENSIVE METABOLIC PANEL
ALBUMIN: 3.3 g/dL — AB (ref 3.5–5.0)
ALT: 24 U/L (ref 14–54)
AST: 28 U/L (ref 15–41)
Alkaline Phosphatase: 60 U/L (ref 38–126)
Anion gap: 10 (ref 5–15)
BUN: 18 mg/dL (ref 6–20)
CHLORIDE: 98 mmol/L — AB (ref 101–111)
CO2: 29 mmol/L (ref 22–32)
Calcium: 8.8 mg/dL — ABNORMAL LOW (ref 8.9–10.3)
Creatinine, Ser: 0.72 mg/dL (ref 0.44–1.00)
GFR calc Af Amer: >60 mL/min (ref >60)
GLUCOSE: 94 mg/dL (ref 65–99)
POTASSIUM: 3.4 mmol/L — AB (ref 3.5–5.1)
SODIUM: 137 mmol/L (ref 135–145)
Total Bilirubin: 0.9 mg/dL (ref 0.3–1.2)
Total Protein: 6.9 g/dL (ref 6.5–8.1)

## 2015-05-28 LAB — URINE MICROSCOPIC-ADD ON
Bacteria, UA: NONE SEEN
RBC / HPF: NONE SEEN RBC/hpf (ref 0–5)
Squamous Epithelial / LPF: NONE SEEN

## 2015-05-28 LAB — TROPONIN I: Troponin I: 0.83 ng/mL (ref <0.031)

## 2015-05-28 LAB — CBC
HEMATOCRIT: 32.8 % — AB (ref 36.0–46.0)
Hemoglobin: 10.9 g/dL — ABNORMAL LOW (ref 12.0–15.0)
MCH: 29.6 pg (ref 26.0–34.0)
MCHC: 33.2 g/dL (ref 30.0–36.0)
MCV: 89.1 fL (ref 78.0–100.0)
Platelets: 258 10*3/uL (ref 150–400)
RBC: 3.68 MIL/uL — ABNORMAL LOW (ref 3.87–5.11)
RDW: 15.3 % (ref 11.5–15.5)
WBC: 16.9 10*3/uL — AB (ref 4.0–10.5)

## 2015-05-28 LAB — BRAIN NATRIURETIC PEPTIDE: B Natriuretic Peptide: 3253.9 pg/mL — ABNORMAL HIGH (ref 0.0–100.0)

## 2015-05-28 LAB — CBG MONITORING, ED: Glucose-Capillary: 80 mg/dL (ref 65–99)

## 2015-05-28 MED ORDER — SODIUM CHLORIDE 0.9% FLUSH
3.0000 mL | Freq: Two times a day (BID) | INTRAVENOUS | Status: DC
Start: 2015-05-28 — End: 2015-06-02
  Administered 2015-05-29 – 2015-06-01 (×7): 3 mL via INTRAVENOUS

## 2015-05-28 MED ORDER — ONDANSETRON HCL 4 MG PO TABS: 4.0000 mg | ORAL_TABLET | Freq: Four times a day (QID) | ORAL | Status: DC | PRN

## 2015-05-28 MED ORDER — ACETAMINOPHEN 325 MG PO TABS
650.0000 mg | ORAL_TABLET | Freq: Four times a day (QID) | ORAL | Status: DC | PRN
  Administered 2015-05-29 – 2015-06-02 (×4): 650 mg via ORAL
  Filled 2015-05-28 (×4): qty 2

## 2015-05-28 MED ORDER — SIMVASTATIN 20 MG PO TABS
20.0000 mg | ORAL_TABLET | Freq: Every day | ORAL | Status: DC
Start: 2015-05-29 — End: 2015-06-02
  Administered 2015-05-29 – 2015-06-01 (×4): 20 mg via ORAL
  Filled 2015-05-28 (×6): qty 1

## 2015-05-28 MED ORDER — FUROSEMIDE 10 MG/ML IJ SOLN
20.0000 mg | Freq: Two times a day (BID) | INTRAMUSCULAR | Status: DC
  Administered 2015-05-28 – 2015-05-29 (×3): 20 mg via INTRAVENOUS
  Filled 2015-05-28 (×3): qty 2

## 2015-05-28 MED ORDER — LISINOPRIL 10 MG PO TABS
5.0000 mg | ORAL_TABLET | Freq: Two times a day (BID) | ORAL | Status: DC
  Administered 2015-05-28: 5 mg via ORAL
  Filled 2015-05-28: qty 1

## 2015-05-28 MED ORDER — POLYSACCHARIDE IRON COMPLEX 150 MG PO CAPS
150.0000 mg | ORAL_CAPSULE | Freq: Every day | ORAL | Status: DC
  Administered 2015-05-29 – 2015-06-02 (×5): 150 mg via ORAL
  Filled 2015-05-28 (×5): qty 1

## 2015-05-28 MED ORDER — POLYETHYLENE GLYCOL 3350 17 GM/SCOOP PO POWD
1.0000 | Freq: Every day | ORAL | Status: DC | PRN
  Filled 2015-05-28: qty 255

## 2015-05-28 MED ORDER — SODIUM CHLORIDE 0.9 % IV SOLN: 250.0000 mL | INTRAVENOUS | Status: DC | PRN

## 2015-05-28 MED ORDER — ONDANSETRON HCL 4 MG/2ML IJ SOLN
4.0000 mg | Freq: Once | INTRAMUSCULAR | Status: AC
  Administered 2015-05-28: 4 mg via INTRAVENOUS
  Filled 2015-05-28: qty 2

## 2015-05-28 MED ORDER — SODIUM CHLORIDE 0.9% FLUSH: 3.0000 mL | Freq: Two times a day (BID) | INTRAVENOUS | Status: DC

## 2015-05-28 MED ORDER — BISACODYL 10 MG RE SUPP: 10.0000 mg | Freq: Every day | RECTAL | Status: DC | PRN

## 2015-05-28 MED ORDER — BUPROPION HCL ER (XL) 300 MG PO TB24
300.0000 mg | ORAL_TABLET | Freq: Every day | ORAL | Status: DC
  Administered 2015-05-29 – 2015-06-02 (×5): 300 mg via ORAL
  Filled 2015-05-28 (×5): qty 1

## 2015-05-28 MED ORDER — ENOXAPARIN SODIUM 40 MG/0.4ML ~~LOC~~ SOLN
40.0000 mg | SUBCUTANEOUS | Status: DC
Start: 2015-05-28 — End: 2015-06-02
  Administered 2015-05-28 – 2015-06-01 (×5): 40 mg via SUBCUTANEOUS
  Filled 2015-05-28 (×5): qty 0.4

## 2015-05-28 MED ORDER — ONDANSETRON HCL 4 MG/2ML IJ SOLN
4.0000 mg | Freq: Four times a day (QID) | INTRAMUSCULAR | Status: DC | PRN
  Administered 2015-06-02: 4 mg via INTRAVENOUS
  Filled 2015-05-28: qty 2

## 2015-05-28 MED ORDER — FUROSEMIDE 10 MG/ML IJ SOLN
60.0000 mg | Freq: Once | INTRAMUSCULAR | Status: AC
  Administered 2015-05-28: 60 mg via INTRAVENOUS
  Filled 2015-05-28: qty 8

## 2015-05-28 MED ORDER — SODIUM CHLORIDE 0.9% FLUSH: 3.0000 mL | INTRAVENOUS | Status: DC | PRN

## 2015-05-28 MED ORDER — CALCIUM CITRATE 950 (200 CA) MG PO TABS
200.0000 mg | ORAL_TABLET | Freq: Every day | ORAL | Status: DC
  Administered 2015-05-29 – 2015-06-02 (×5): 200 mg via ORAL
  Filled 2015-05-28 (×7): qty 1

## 2015-05-28 MED ORDER — POTASSIUM CHLORIDE CRYS ER 20 MEQ PO TBCR
40.0000 meq | EXTENDED_RELEASE_TABLET | Freq: Once | ORAL | Status: AC
  Administered 2015-05-28: 40 meq via ORAL
  Filled 2015-05-28: qty 2

## 2015-05-28 MED ORDER — DOCUSATE SODIUM 100 MG PO CAPS
100.0000 mg | ORAL_CAPSULE | Freq: Two times a day (BID) | ORAL | Status: DC
  Administered 2015-05-29 – 2015-06-02 (×9): 100 mg via ORAL
  Filled 2015-05-28 (×9): qty 1

## 2015-05-28 MED ORDER — PANTOPRAZOLE SODIUM 40 MG PO TBEC
40.0000 mg | DELAYED_RELEASE_TABLET | Freq: Every day | ORAL | Status: DC
  Administered 2015-05-29 – 2015-05-31 (×3): 40 mg via ORAL
  Filled 2015-05-28 (×3): qty 1

## 2015-05-28 MED ORDER — ACETAMINOPHEN 650 MG RE SUPP: 650.0000 mg | Freq: Four times a day (QID) | RECTAL | Status: DC | PRN

## 2015-05-28 MED ORDER — ASPIRIN 81 MG PO CHEW
324.0000 mg | CHEWABLE_TABLET | Freq: Once | ORAL | Status: AC
  Administered 2015-05-28: 324 mg via ORAL
  Filled 2015-05-28: qty 4

## 2015-05-28 MED ORDER — LEVOTHYROXINE SODIUM 88 MCG PO TABS
88.0000 ug | ORAL_TABLET | Freq: Every day | ORAL | Status: DC
  Administered 2015-05-29 – 2015-06-02 (×5): 88 ug via ORAL
  Filled 2015-05-28 (×5): qty 1

## 2015-05-28 MED ORDER — POLYETHYLENE GLYCOL 3350 17 G PO PACK: 17.0000 g | PACK | Freq: Every day | ORAL | Status: DC | PRN

## 2015-05-28 NOTE — ED Notes (Signed)
20 min timer started 8;32

## 2015-05-28 NOTE — Clinical Social Work Note (Signed)
Clinical Social Work Assessment  Patient Details  Name: Dana [name] MRN: [id number] Date of Birth: 1927-08-13  Date of referral:  05/28/15               Reason for consult:   (Patient is from facility/[facility])                Permission sought to share information with:   (None.) Permission granted to share information::  No  Name::        Agency::     Relationship::     Contact Information:     Housing/Transportation Living arrangements for the past 2 months:  [place] of Information:  Patient Patient Interpreter Needed:  None Criminal Activity/Legal Involvement Pertinent to Current Situation/Hospitalization:    Significant Relationships:  Adult Children (Patient states that her children are her primary support, and that her son/David Samarin is her POA.) Lives with:  Facility Resident Do you feel safe going back to the place where you live?  Yes Need for family participation in patient care:  Yes (Comment) (Family participation would be helpful.)  Care giving concerns:  CSW was notified that patient may possibly need a higher level of care.   Social Worker assessment / plan:  CSW met with patient at bedside. Patient was alert and oriented during assessment. However, she states that she is hard of hearing. Nurse tech reported to CSW that patient appeared to be confused earlier today.  Per note, patient presents to [facility] due to altered mental status and possible fall. However, patient denies falling today... But says she does feel that she has been falling more often. Patient informed CSW that she currently receives PT at her facility. Patient states that she ambulates using a walker.  Patient states that upon discharge she would feel safe to return to facility.  Employment status:  Retired Forensic scientist:  Medicare PT Recommendations:  Not assessed at this time [place] / Referral to community resources:   (Patient is a resident of  [facility].)  Patient/Family's Response to care:  Patient is appropriate at this time. Patient is aware of admission and is accepting.  Patient/Family's Understanding of and Emotional Response to Diagnosis, Current Treatment, and Prognosis:  Patient states she has no questions for CSW.  Emotional Assessment Appearance:  Appears stated age Attitude/Demeanor/Rapport:   (Appropriate. Hard of hearing.) Affect (typically observed):  Accepting, Appropriate Orientation:  Oriented to Self, Oriented to Place, Oriented to  Time, Oriented to Situation Alcohol / Substance use:  Not Applicable Psych involvement (Current and /or in the community):  No (Comment)  Discharge Needs  Concerns to be addressed:  Adjustment to Illness Readmission within the last 30 days:  Yes Current discharge risk:  None Barriers to Discharge:  No Barriers Identified   Bernita Buffy, LCSW 05/28/2015, 9:34 PM

## 2015-05-28 NOTE — H&P (Addendum)
[facility] History and Physical  Dana [name] [id number]:[id number] DOB: [birth year] DOA: 05/28/2015  Referring physician: Dr. Johnney Killian PCP: Reginia Naas, MD   Chief Complaint: Confusion  HPI: Dana [name] is a 82 y.o. female with hx of aortic stenosis, CHF, depression /anxiety, remote BRCA (1970), hx NHL, migraine, hypothyroidism, back surgery found down on the floor at the ALF today and brought to ED. In ED BP normal, HR 95, RR 16- 30, temp 98.5, 97% Sa)2 on 3L [place].  She was recently admitted with resp failure and dc'd on home O2.  CXR showing pulm edema bilaterally.     Chart review: Dec 2016 > acute resp failure/ HCAP and combined CHF. Rx abx IV and IV lasix. Improved. EF 40%, +confusion, hx recurrent falls. Hypothyroid.  Jan 2- 8, 2017 > hx pulm HTN and severe AS, presented with SOB/ hypoxemia, cough. CXR pulm edema, rx IV lasix. Per cards not candidate for TAVR or AVR given advanced dementia.  Jan 24- 28, 2017 > acute resp failure w hypoxemia/ ^wbc/ tachypnea , cxr with pulm edema, question LLL infiltrate. BCx' sneg, urine ag's negative. Flu neg. PT rec'd SNF, pateitn refused.   ROS  denies CP  no joint pain   no HA  no blurry vision  no rash  no diarrhea  no nausea/ vomiting  no dysuria  no difficulty voiding  no change in urine color    Where does patient live ALF Can patient participate in ADLs? yes  Past Medical History  Past Medical History  Diagnosis Date  . Hypothyroidism   . Esophageal reflux   . Seasonal allergies   . Hypercholesteremia   . Depression   . Anxiety   . History of diverticulitis of colon   . History of peptic ulcer   . Chronic back pain     "gets it worse as it goes down" (02/24/2015)  . Bladder prolapse, female, acquired   . Heart murmur   . History of electroconvulsive therapy 1956    "then had insulin shock; for deep deep depression"  . History of hiatal hernia   . Migraine     "might have one maybe once/month" (02/24/2015)   . Arthritis     "in my back" (02/24/2015)  . Breast cancer, left breast ([place]) 1970  . Non Hodgkin's lymphoma ([place]) 2001  . Basal cell carcinoma of nasal tip    Past Surgical History  Past Surgical History  Procedure Laterality Date  . Lumbar laminectomy  2012  . Mastectomy, radical Left 1970  . Back surgery    . Abdominal hysterectomy      "took out my uterus only"  . Cataract extraction w/ intraocular lens  implant, bilateral    . Breast biopsy Left 1970  . Bone marrow biopsy  2001    "for non-Hodgkin's lymphoma"   Family History  Family History  Problem Relation Age of Onset  . Diabetes Mother   . Heart disease Mother   . Heart attack Mother   . Cancer - Prostate Father    Social History  reports that she has quit smoking. Her smoking use included Cigarettes. She has a 5 pack-year smoking history. She has never used smokeless tobacco. She reports that she drinks about 0.6 oz of alcohol per week. She reports that she does not use illicit drugs. Allergies  Allergies  Allergen Reactions  . Amoxicillin Other (See Comments)    Excessive sweating   . Amoxicillin-Pot Clavulanate Other (See Comments)  .  Azithromycin Other (See Comments)    Numbness/pain in fingers  . Celexa [Citalopram Hydrobromide]     unknown  . Iohexol      Desc: will need premedicated   . Pollen Extract   . Prozac [Fluoxetine Hcl]     unknown  . Levaquin [Levofloxacin In D5w] Rash  . Promethazine Other (See Comments)    Restlessness    . Shrimp [Shellfish Allergy] Itching   Home medications Prior to Admission medications   Medication Sig Start Date End Date Taking? Authorizing Provider  buPROPion (WELLBUTRIN XL) 150 MG 24 hr tablet Take 300 mg by mouth daily.    Yes Historical Provider, MD  calcium citrate (CALCITRATE - DOSED IN MG ELEMENTAL CALCIUM) 950 MG tablet Take 200 mg of elemental calcium by mouth daily.   Yes Historical Provider, MD  diazepam (VALIUM) 5 MG tablet Take 10 mg by mouth  daily.    Yes Historical Provider, MD  docusate sodium (COLACE) 100 MG capsule Take 100 mg by mouth 2 (two) times daily.   Yes Historical Provider, MD  furosemide (LASIX) 40 MG tablet Take 20 mg by mouth daily as needed for fluid or edema.    Yes Historical Provider, MD  ibuprofen (ADVIL,MOTRIN) 200 MG tablet Take 200 mg by mouth every 6 (six) hours as needed for moderate pain.   Yes Historical Provider, MD  iron polysaccharides (NIFEREX) 150 MG capsule Take 150 mg by mouth daily.   Yes Historical Provider, MD  levothyroxine (SYNTHROID) 88 MCG tablet Take 88 mcg by mouth daily before breakfast.   Yes Historical Provider, MD  loratadine (CLARITIN) 10 MG tablet Take 10 mg by mouth daily.   Yes Historical Provider, MD  omeprazole (PRILOSEC) 20 MG capsule Take 20 mg by mouth daily. 05/04/15  Yes Historical Provider, MD  permethrin (ELIMITE) 5 % cream Apply 1 application topically once.   Yes Historical Provider, MD  polyethylene glycol powder (MIRALAX) powder Take 1 Container by mouth daily as needed for mild constipation.    Yes Historical Provider, MD  simvastatin (ZOCOR) 20 MG tablet Take 20 mg by mouth daily.   Yes Historical Provider, MD  cefUROXime (CEFTIN) 250 MG tablet Take 1 tablet (250 mg total) by mouth 2 (two) times daily with a meal. Patient not taking: Reported on 05/28/2015 05/19/15   Velta Addison Mikhail, DO  predniSONE (DELTASONE) 20 MG tablet Take 95m (4 tabs) x 2 days, then taper to 383m(3 tabs) x 2 days, then 2059m2 tabs) x 2 days, then 23m21m tab) x 2 days, then OFF. Patient not taking: Reported on 05/28/2015 05/19/15   MaryCristal Ford   Liver Function Tests  Recent Labs Lab 05/28/15 1224  AST 28  ALT 24  ALKPHOS 60  BILITOT 0.9  PROT 6.9  ALBUMIN 3.3*   No results for input(s): LIPASE, AMYLASE in the last 168 hours. CBC  Recent Labs Lab 05/28/15 1224  WBC 16.9*  HGB 10.9*  HCT 32.8*  MCV 89.1  PLT 258 426asic Metabolic Panel  Recent Labs Lab 05/28/15 1224   NA 137  K 3.4*  CL 98*  CO2 29  GLUCOSE 94  BUN 18  CREATININE 0.72  CALCIUM 8.8*     Filed Vitals:   05/28/15 1413 05/28/15 1443 05/28/15 1541 05/28/15 1717  BP: 126/65 127/69 123/63 123/63  Pulse: 95 96 92 95  Temp:      TempSrc:      Resp: _0 SpO2:  96% 95% 98% 97%   Exam: Elderly frail WF, not in distress, slight ^WOB No rash, cyanosis or gangrene Sclera anicteric, throat clear +JVD Chest scattered rales bilat post 1/2 up RRR 2/6 SEM rusb, no RG ABd soft no ascites or hsm, nontender +bs GU defer MS no joint effusion/ deform Ext 1-2+ bilat LE edema No wound/ ulcers Neuro Ox 3, nonfocal, gen weakness  EKG (independently reviewed) > NSR , LVH w repol chgs, QTc 55mec CXR (independently reviewed) > 3/9 +bilat pulm edema  Home medications > wellbutrin EL, valium 10/d, colace, lasix 20/ d prn edema, Advil, niferex, synthroid, claritin, prilosec, miralax, zocor  Na 137 K 3.4  BUN 18 Creat 0.72  Alb 3.3  bnp 3253  WBC 16k  Hb 10.9 plt 258  Trop 0.83 UA negative CT head atrophy no acute  Assessment: 1 LOC episode - prob from CHF vs syncope which are both symptoms of severe AS 2 Severe AS - not surg candidate or TAVR candidate per cardiology on recent admit 3 Pulm edema 4 Confusion - is oriented now 5 Recurrent acute resp failure - due to #2 most likely. On home O2 6 Depression 7 Hx dementia 8 Vol excess - moderate LE edema 9 ^trop - from stress of CHF, no further cardiac work-up as noted by cardiology consults in the past 10 Anxiety - hold daily dose of valium 10 mg for now  Plan - have spoken w daughter ALelon Frohlichthat his condition is usually progressive and ultimately may cause pt's demise.  Asked her to discuss DNR status with her family (two brothers), which she said she would do. In the meantime admit patient for diuresis and will start her on an ACEi as low dose as this is what cardiology recommended as part of medical Rx back in December. If BP drops or  creat bumps too much would dc.  Have spoken w patient, she understands medical Rx only option and questions answered.    DVT Prophylaxis lovenox  Code Status: full  Family Communication: as above  Disposition Plan: when better back to ALF    [facility] Pager [phone] Cell [phone] If 7PM-7AM, please contact night-coverage [email or web address] Password [email or web address] Center3/11/2015, 6:36 PM

## 2015-05-28 NOTE — Discharge Instructions (Signed)

## 2015-05-28 NOTE — ED Notes (Signed)
Nausea, vomiting, decreased PO intake x 4 days. Pt lives in assisted living, they found her sitting on the floor in her apartment, assumed fall, pt is now having altered mental status (making up elaborate stories, able to answer all standard time date person place orientation questions). No hx of dementia.

## 2015-05-28 NOTE — ED Provider Notes (Signed)
CSN: [id number]     Arrival date & time 05/28/15  1155 History   First MD Initiated Contact with Patient 05/28/15 1238     Chief Complaint  Patient presents with  . Altered Mental Status  . Fall     (Consider location/radiation/quality/duration/timing/severity/associated sxs/prior Treatment) HPI   82 year old female brought in for evaluation after she was found sitting on the floor for apartment. She lives in assisted living.  Unsure of the exact circumstances as to why she was sitting on the ground.Not clear she actually fell or not.She seemed to be confused and they sent her for further evaluation. On my exam patient is alert and answering questions appropriately. She denies any acute pain. No dyspnea. She cannot provide me with a plausible explanation as to why she was sitting on the floor.  Past Medical History  Diagnosis Date  . Hypothyroidism   . Esophageal reflux   . Seasonal allergies   . Hypercholesteremia   . Depression   . Anxiety   . History of diverticulitis of colon   . History of peptic ulcer   . Chronic back pain     "gets it worse as it goes down" (02/24/2015)  . Bladder prolapse, female, acquired   . Heart murmur   . History of electroconvulsive therapy 1956    "then had insulin shock; for deep deep depression"  . History of hiatal hernia   . Migraine     "might have one maybe once/month" (02/24/2015)  . Arthritis     "in my back" (02/24/2015)  . Breast cancer, left breast ([place]) 1970  . Non Hodgkin's lymphoma ([place]) 2001  . Basal cell carcinoma of nasal tip    Past Surgical History  Procedure Laterality Date  . Lumbar laminectomy  2012  . Mastectomy, radical Left 1970  . Back surgery    . Abdominal hysterectomy      "took out my uterus only"  . Cataract extraction w/ intraocular lens  implant, bilateral    . Breast biopsy Left 1970  . Bone marrow biopsy  2001    "for non-Hodgkin's lymphoma"   Family History  Problem Relation Age of Onset  . Diabetes  Mother   . Heart disease Mother   . Heart attack Mother   . Cancer - Prostate Father    Social History  Substance Use Topics  . Smoking status: Former Smoker -- 1.00 packs/day for 5 years    Types: Cigarettes  . Smokeless tobacco: Never Used  . Alcohol Use: 0.6 oz/week    1 Glasses of wine per week   OB History    No data available     Review of Systems  All systems reviewed and negative, other than as noted in HPI.   Allergies  Amoxicillin; Amoxicillin-pot clavulanate; Azithromycin; Celexa; Iohexol; Pollen extract; Prozac; Levaquin; Promethazine; and Shrimp  Home Medications   Prior to Admission medications   Medication Sig Start Date End Date Taking? Authorizing Provider  buPROPion (WELLBUTRIN XL) 150 MG 24 hr tablet Take 300 mg by mouth daily.    Yes Historical Provider, MD  calcium citrate (CALCITRATE - DOSED IN MG ELEMENTAL CALCIUM) 950 MG tablet Take 200 mg of elemental calcium by mouth daily.   Yes Historical Provider, MD  diazepam (VALIUM) 5 MG tablet Take 10 mg by mouth daily.    Yes Historical Provider, MD  docusate sodium (COLACE) 100 MG capsule Take 100 mg by mouth 2 (two) times daily.   Yes Historical Provider, MD  furosemide (LASIX) 40 MG tablet Take 20 mg by mouth daily as needed for fluid or edema.    Yes Historical Provider, MD  ibuprofen (ADVIL,MOTRIN) 200 MG tablet Take 200 mg by mouth every 6 (six) hours as needed for moderate pain.   Yes Historical Provider, MD  iron polysaccharides (NIFEREX) 150 MG capsule Take 150 mg by mouth daily.   Yes Historical Provider, MD  levothyroxine (SYNTHROID) 88 MCG tablet Take 88 mcg by mouth daily before breakfast.   Yes Historical Provider, MD  loratadine (CLARITIN) 10 MG tablet Take 10 mg by mouth daily.   Yes Historical Provider, MD  omeprazole (PRILOSEC) 20 MG capsule Take 20 mg by mouth daily. 05/04/15  Yes Historical Provider, MD  permethrin (ELIMITE) 5 % cream Apply 1 application topically once.   Yes Historical  Provider, MD  polyethylene glycol powder (MIRALAX) powder Take 1 Container by mouth daily as needed for mild constipation.    Yes Historical Provider, MD  simvastatin (ZOCOR) 20 MG tablet Take 20 mg by mouth daily.   Yes Historical Provider, MD  cefUROXime (CEFTIN) 250 MG tablet Take 1 tablet (250 mg total) by mouth 2 (two) times daily with a meal. Patient not taking: Reported on 05/28/2015 05/19/15   Velta Addison Mikhail, DO  predniSONE (DELTASONE) 20 MG tablet Take 65m (4 tabs) x 2 days, then taper to 351m(3 tabs) x 2 days, then 2022m2 tabs) x 2 days, then 21m14m tab) x 2 days, then OFF. Patient not taking: Reported on 05/28/2015 05/19/15   Maryann Mikhail, DO   BP 123/63 mmHg  Pulse 92  Temp(Src) 98.5 F (36.9 C) (Oral)  Resp 29  SpO2 98% Physical Exam  Constitutional: She appears well-developed and well-nourished. No distress.  HENT:  Head: Normocephalic and atraumatic.  Eyes: Conjunctivae are normal. Right eye exhibits no discharge. Left eye exhibits no discharge.  Neck: Neck supple.  Cardiovascular: Normal rate and regular rhythm.  Exam reveals no gallop and no friction rub.   Murmur heard. Pulmonary/Chest: Effort normal and breath sounds normal. No respiratory distress.  Abdominal: Soft. She exhibits no distension. There is no tenderness.  Musculoskeletal: She exhibits edema. She exhibits no tenderness.  Neurological: She is alert.  Skin: Skin is warm and dry.  Psychiatric: She has a normal mood and affect. Her behavior is normal. Thought content normal.  Nursing note and vitals reviewed.   ED Course  Procedures (including critical care time) Labs Review Labs Reviewed  COMPREHENSIVE METABOLIC PANEL - Abnormal; Notable for the following:    Potassium 3.4 (*)    Chloride 98 (*)    Calcium 8.8 (*)    Albumin 3.3 (*)    All other components within normal limits  CBC - Abnormal; Notable for the following:    WBC 16.9 (*)    RBC 3.68 (*)    Hemoglobin 10.9 (*)    HCT 32.8 (*)     All other components within normal limits  URINALYSIS, ROUTINE W REFLEX MICROSCOPIC (NOT [facility]; Notable for the following:    Color, Urine AMBER (*)    Bilirubin Urine SMALL (*)    Protein, ur 30 (*)    All other components within normal limits  TROPONIN I - Abnormal; Notable for the following:    Troponin I 0.83 (*)    All other components within normal limits  BRAIN NATRIURETIC PEPTIDE - Abnormal; Notable for the following:    B Natriuretic Peptide 3253.9 (*)  All other components within normal limits  URINE MICROSCOPIC-ADD ON - Abnormal; Notable for the following:    Casts HYALINE CASTS (*)    All other components within normal limits  BASIC METABOLIC PANEL - Abnormal; Notable for the following:    Chloride 99 (*)    Glucose, Bld 101 (*)    Calcium 8.1 (*)    GFR calc non Af Amer 56 (*)    All other components within normal limits  BASIC METABOLIC PANEL - Abnormal; Notable for the following:    BUN 22 (*)    Calcium 8.6 (*)    GFR calc non Af Amer 54 (*)    All other components within normal limits  CBG MONITORING, ED    Imaging Review Dg Chest 2 View  05/28/2015  CLINICAL DATA:  Hypoxemia EXAM: CHEST  2 VIEW COMPARISON:  05/15/2015 FINDINGS: Progression of bilateral airspace disease most consistent with pulmonary edema. There is underlying chronic lung disease with superimposed acute findings difficult to quantify. No significant effusion. Cardiac enlargement. IMPRESSION: Chronic lung disease. Progression of bilateral airspace disease suggestive of superimposed edema. Electronically Signed   By: Franchot Gallo M.D.   On: 05/28/2015 16:47   Ct Head Wo Contrast  05/28/2015  CLINICAL DATA:  Nausea, vomiting, decreased p.o. intake for 4 days. Likely status post fall. Altered mental status. EXAM: CT HEAD WITHOUT CONTRAST TECHNIQUE: Contiguous axial images were obtained from the base of the skull through the vertex without intravenous contrast. COMPARISON:  Head CT  dated 03/14/2014. FINDINGS: Again noted is generalized brain atrophy, with asymmetrically prominent frontal lobe atrophy, with commensurate dilatation of the ventricles and sulci. There is no mass, hemorrhage, edema or other evidence of acute parenchymal abnormality. No extra-axial hemorrhage. No osseous fracture or dislocation. Superficial soft tissues are unremarkable. IMPRESSION: No acute findings. No intracranial mass, hemorrhage, or edema. No skull fracture. Generalized brain atrophy, but with asymmetrically prominent frontal lobe atrophy, similar to previous head CT of 03/14/2014. Electronically Signed   By: Franki Cabot M.D.   On: 05/28/2015 13:42   I have personally reviewed and evaluated these images and lab results as part of my medical decision-making.   EKG Interpretation   Date/Time:  Thursday May 28 2015 12:17:40 EST Ventricular Rate:  94 PR Interval:  150 QRS Duration: 105 QT Interval:  422 QTC Calculation: 528 R Axis:   8 Text Interpretation:  Sinus rhythm LVH with secondary repolarization  abnormality Prolonged QT interval no sig change c/w old Confirmed by  Johnney Killian, [place] [phone]) on 05/28/2015 6:02:12 PM      MDM   Final diagnoses:  Fall, initial encounter  Confusional state    82 year old female coming from assisted living facility where she was found on the ground confused. Somewhat rambling speech but is alert and oriented. UA still pending. Patient has a past history of severe aortic stenosis and systolic/diastolic heart failure. She was recently admitted with respiratory failure. She was discharged on supplemental oxygen. Chest x-ray today does show some fluid edema. She was given some IV Lasix. Her oxygen saturations improved to normal when she was placed on supplemental oxygen. She does not seem distressed. She denies any acute pain or dyspnea. Recommended that patient go to skilled nursing facility upon discharge but apparently refusing. At this point in  time I do not feel that assisted-living is the most appropriate level of care for her. Will discuss with social work.     Virgel Manifold, MD 06/01/15 [phone]

## 2015-05-28 NOTE — ED Provider Notes (Signed)
Patient had an episode of significant confusion. Found on the floor at her assisted living. Patient denies her to fall involved. She does however report being extremely confused and not knowing where she was him believing that she was in somebody else's room. She is not advising chest pain at this time. Her troponin has however returned elevated. Patient also has leukocytosis of uncertain etiology. At this time patient will be placed in observation status for rule out and observation for possible early infection of unidentified source. I also discussed the patient's case with her daughter on the telephone. Hospitalist has been consult for admission.  Charlesetta Shanks, MD 05/28/15 [phone]

## 2015-05-29 DIAGNOSIS — I350 Nonrheumatic aortic (valve) stenosis: Secondary | ICD-10-CM

## 2015-05-29 DIAGNOSIS — R7989 Other specified abnormal findings of blood chemistry: Secondary | ICD-10-CM

## 2015-05-29 DIAGNOSIS — I5043 Acute on chronic combined systolic (congestive) and diastolic (congestive) heart failure: Secondary | ICD-10-CM

## 2015-05-29 DIAGNOSIS — E039 Hypothyroidism, unspecified: Secondary | ICD-10-CM

## 2015-05-29 DIAGNOSIS — J810 Acute pulmonary edema: Secondary | ICD-10-CM

## 2015-05-29 DIAGNOSIS — J9621 Acute and chronic respiratory failure with hypoxia: Secondary | ICD-10-CM

## 2015-05-29 LAB — BASIC METABOLIC PANEL
ANION GAP: 7 (ref 5–15)
BUN: 19 mg/dL (ref 6–20)
CO2: 30 mmol/L (ref 22–32)
Calcium: 8.1 mg/dL — ABNORMAL LOW (ref 8.9–10.3)
Chloride: 99 mmol/L — ABNORMAL LOW (ref 101–111)
Creatinine, Ser: 0.91 mg/dL (ref 0.44–1.00)
GFR calc Af Amer: >60 mL/min (ref >60)
GFR, EST NON AFRICAN AMERICAN: 56 mL/min — AB (ref >60)
GLUCOSE: 101 mg/dL — AB (ref 65–99)
POTASSIUM: 3.7 mmol/L (ref 3.5–5.1)
Sodium: 136 mmol/L (ref 135–145)

## 2015-05-29 MED ORDER — LISINOPRIL 2.5 MG PO TABS
2.5000 mg | ORAL_TABLET | Freq: Two times a day (BID) | ORAL | Status: DC
  Administered 2015-05-29 – 2015-05-31 (×5): 2.5 mg via ORAL
  Filled 2015-05-29 (×10): qty 1

## 2015-05-29 MED ORDER — DIAZEPAM 5 MG PO TABS
2.5000 mg | ORAL_TABLET | Freq: Two times a day (BID) | ORAL | Status: DC | PRN
  Administered 2015-05-29 – 2015-06-02 (×4): 2.5 mg via ORAL
  Filled 2015-05-29 (×4): qty 1

## 2015-05-29 MED ORDER — CARVEDILOL 3.125 MG PO TABS
3.1250 mg | ORAL_TABLET | Freq: Two times a day (BID) | ORAL | Status: DC
  Administered 2015-05-29 – 2015-05-30 (×2): 3.125 mg via ORAL
  Filled 2015-05-29 (×4): qty 1

## 2015-05-29 NOTE — Progress Notes (Signed)
Pt admitted from [facility].   RNCM to follow if any HH needs arise.   CSW signing off.   Please re-consult if any social work needs arise.  Alison Murray, MSW, [place] Work [phone]

## 2015-05-29 NOTE — Progress Notes (Signed)
[facility] PROGRESS NOTE  Dana [name] [id number] DOB: 09-06-1927 DOA: 05/28/2015 PCP: Reginia Naas, MD  Assessment/Plan: Acute on chronic respiratory failure with hypoxia -Likely secondary to pulmonary vascular congestion/interstitial edema -secondary to #2 -Continue supplemental oxygen and supportive care -treatment as mentioned below for HF exacerbation  Chronic combined systolic and diastolic heart failure/severe aortic stenosis -Echocardiogram December 2016 showed an EF of [id number], grade 1 diastolic heart failure -Currently does not appear to be significantly volume overloaded -will follow daily weights, intake and output -Continue IV lasix  -UOP over past 24hrs 3.5 L -will add low dose ACE and b-blocker as recommended by cardiology in the past  Elevated troponin -from demand ischemia due to Heart failure exacerbation -no further work up as indicated by cardiology in the past -discussed with family, code status transition to DNR -pursuit medical management   Hypokalemia -due to diuresis -will replete as needed   Hypomagnesemia -Continue Mag-Ox  Hypothyroidism -Continue Synthroid  Hyperlipidemia -Continue statin  Anxiety/depression -Continue Wellbutrin/Valium  Physical deconditioning -PT evaluation requested -appears to be candidate for SNF  Code Status: DNR Family Communication: discussed with daughter over the phone  Disposition Plan: Continue IV diuresis for at least another 24 hours, follow patient vital signs in response to initiation of low-dose lisinopril and coreg. Discuss discharge plans with family   Consultants:  None  Procedures:  See below for x-ray reports  Antibiotics:  None  HPI/Subjective: Excellent urine output overnight; afebrile and denying chest pain. Patient is very heard of hearing and expressed some anxiety after knowing she might not be able to go back to ALF at discharge.  Objective: Filed Vitals:   05/29/15 0425 05/29/15 1754  BP: 102/55 90/45  Pulse: 75 85  Temp: 98 F (36.7 C)   Resp: 19     Intake/Output Summary (Last 24 hours) at 05/29/15 1850 Last data filed at 05/29/15 1453  Gross per 24 hour  Intake    360 ml  Output   6800 ml  Net  -6440 ml   Filed Weights   05/28/15 2117 05/29/15 0427  Weight: 63.6 kg (140 lb 3.4 oz) 61.7 kg (136 lb 0.4 oz)    Exam:   General:  Afebrile, denies chest pain. Patient with difficulty speaking in full sentences and very hard of hearing.  Cardiovascular: S1 and S2, positive systolic murmur; no rubs or gallops; no JVD appreciated on exam.  Respiratory: Bibasilar crackles, no wheezing; no use of accessory muscles and otherwise clear to auscultation.  Abdomen: Soft, nontender, nondistended, positive bowel sounds  Musculoskeletal: No edema or cyanosis  Data Reviewed: Basic Metabolic Panel:  Recent Labs Lab 05/28/15 1224 05/29/15 0414  NA 137 136  K 3.4* 3.7  CL 98* 99*  CO2 29 30  GLUCOSE 94 101*  BUN 18 19  CREATININE 0.72 0.91  CALCIUM 8.8* 8.1*   Liver Function Tests:  Recent Labs Lab 05/28/15 1224  AST 28  ALT 24  ALKPHOS 60  BILITOT 0.9  PROT 6.9  ALBUMIN 3.3*   CBC:  Recent Labs Lab 05/28/15 1224  WBC 16.9*  HGB 10.9*  HCT 32.8*  MCV 89.1  PLT 258   Cardiac Enzymes:  Recent Labs Lab 05/28/15 1646  TROPONINI 0.83*   BNP (last 3 results)  Recent Labs  04/24/15 0357 05/15/15 1133 05/28/15 1646  BNP 2132.5* 2294.6* 3253.9*   CBG:  Recent Labs Lab 05/28/15 1237  GLUCAP 80   Studies: Dg Chest 2 View  05/28/2015  CLINICAL DATA:  Hypoxemia EXAM: CHEST  2 VIEW COMPARISON:  05/15/2015 FINDINGS: Progression of bilateral airspace disease most consistent with pulmonary edema. There is underlying chronic lung disease with superimposed acute findings difficult to quantify. No significant effusion. Cardiac enlargement. IMPRESSION: Chronic lung disease. Progression of bilateral airspace disease  suggestive of superimposed edema. Electronically Signed   By: Franchot Gallo M.D.   On: 05/28/2015 16:47   Ct Head Wo Contrast  05/28/2015  CLINICAL DATA:  Nausea, vomiting, decreased p.o. intake for 4 days. Likely status post fall. Altered mental status. EXAM: CT HEAD WITHOUT CONTRAST TECHNIQUE: Contiguous axial images were obtained from the base of the skull through the vertex without intravenous contrast. COMPARISON:  Head CT dated 03/14/2014. FINDINGS: Again noted is generalized brain atrophy, with asymmetrically prominent frontal lobe atrophy, with commensurate dilatation of the ventricles and sulci. There is no mass, hemorrhage, edema or other evidence of acute parenchymal abnormality. No extra-axial hemorrhage. No osseous fracture or dislocation. Superficial soft tissues are unremarkable. IMPRESSION: No acute findings. No intracranial mass, hemorrhage, or edema. No skull fracture. Generalized brain atrophy, but with asymmetrically prominent frontal lobe atrophy, similar to previous head CT of 03/14/2014. Electronically Signed   By: Franki Cabot M.D.   On: 05/28/2015 13:42    Scheduled Meds: . buPROPion  300 mg Oral Daily  . calcium citrate  200 mg of elemental calcium Oral Daily  . carvedilol  3.125 mg Oral BID WC  . docusate sodium  100 mg Oral BID  . enoxaparin (LOVENOX) injection  40 mg Subcutaneous Q24H  . furosemide  20 mg Intravenous BID  . iron polysaccharides  150 mg Oral Daily  . levothyroxine  88 mcg Oral QAC breakfast  . lisinopril  2.5 mg Oral BID  . pantoprazole  40 mg Oral Daily  . simvastatin  20 mg Oral Daily  . sodium chloride flush  3 mL Intravenous Q12H   Continuous Infusions:   Active Problems:   Acute respiratory failure with hypoxia (HCC)   Hypothyroidism   Severe aortic stenosis   Chronic combined systolic and diastolic CHF (congestive heart failure) (HCC)   Troponin level elevated   Pulmonary edema    Time spent: 30 minutes    Barton Dubois  [facility] Pager [phone]. If 7PM-7AM, please contact night-coverage at [email or web address], password [facility] 05/29/2015, 6:50 PM  LOS: 1 day

## 2015-05-30 MED ORDER — FUROSEMIDE 10 MG/ML IJ SOLN
20.0000 mg | Freq: Every day | INTRAMUSCULAR | Status: DC
  Administered 2015-05-30: 20 mg via INTRAVENOUS
  Filled 2015-05-30: qty 2

## 2015-05-30 NOTE — Evaluation (Signed)
Physical Therapy Evaluation Patient Details Name: Dana [name] MRN: [id number] DOB: 20-Jul-1927 Today's Date: 05/30/2015   History of Present Illness  Dana [name] is a 82 y.o. female Recently hospitalized from 2/2- 04/29/2015 with acute respiratory failure with hypoxia due to acute combined systolic and diastolic CHF, complicated with enterococcus UTI, brought by ambulance from her nursing home with worsening SOB, worse on ambulation. She had recently finished antibiotics course for PNA. She was supposed to have home O2 but had not received the tubing at the time of admission and family dynamics  Clinical Impression  Pt was more calm during this time and session. She seemed to have more understanding of her situation (see my previous note as well.) Pt with decreased strength and mobility to benefit from Pt to increase safety and mobility.     Follow Up Recommendations SNF (or HHPT at her Independent apartment. Make sure O2 set up correctly as well. feel eventual need for tranition into SNF or AL from her aprtmetn. Needs more PRN assist )    Equipment Recommendations       Recommendations for Other Services       Precautions / Restrictions Precautions Precautions: Fall Precaution Comments: pt reports significant h/o multiple falls.       Mobility  Bed Mobility Overal bed mobility: Modified Independent Bed Mobility: Supine to Sit;Sit to Supine     Supine to sit: HOB elevated;Min guard Sit to supine: Min guard   General bed mobility comments: needed a little time and help   Transfers Overall transfer level: Needs assistance Equipment used: Rolling walker (2 wheeled) Transfers: Sit to/from Stand Sit to Stand: Min assist         General transfer comment: Min guard assist for safety as pt stood before PT was ready with the RW.    Ambulation/Gait Ambulation/Gait assistance: Min guard Ambulation Distance (Feet): 20 Feet Assistive device: Rolling walker (2  wheeled) Gait Pattern/deviations: Step-through pattern     General Gait Details: small steps fatigued easily, limited due to need of O2 and had to return to O2 [place].   Stairs            Wheelchair Mobility    Modified Rankin (Stroke Patients Only)       Balance                                             Pertinent Vitals/Pain      Home Living Family/patient expects to be discharged to:: Other (Comment) ([facility]) Living Arrangements: Alone   Type of Home: Independent living facility Home Access: Level entry     Home Layout: One level Home Equipment: Environmental consultant - 4 wheels Additional Comments[place] resident of [employer], needs tank/tubing for RW to be portable with O2.     Prior Function Level of Independence: Independent with assistive device(s)         Comments: reports independent with RW in her indp. apartment     Hand Dominance        Extremity/Trunk Assessment               Lower Extremity Assessment: Generalized weakness   LLE Deficits / Details: Pt is generally weak and deconditioned, however, reports that her left leg has "sciatica" and that she often has weakness in this leg.  She thinks this may be why  she falls so frequently.      Communication   Communication: HOH  Cognition Arousal/Alertness: Awake/alert Behavior During Therapy: WFL for tasks assessed/performed;Anxious Overall Cognitive Status: Within Functional Limits for tasks assessed                      General Comments      Exercises Other Exercises Other Exercises: educated on deep /pursed lip breathing for lung exercises.       Assessment/Plan    PT Assessment Patient needs continued PT services  PT Diagnosis Generalized weakness   PT Problem List Decreased strength;Decreased activity tolerance;Decreased mobility  PT Treatment Interventions Gait training;Functional mobility training;Therapeutic  activities;Therapeutic exercise;Patient/family education   PT Goals (Current goals can be found in the Care Plan section) Acute Rehab PT Goals Patient Stated Goal: " i am dying, my condition is not going to get better , but now understand PT is to help me be safe and maintain what I can"  PT Goal Formulation: With patient Time For Goal Achievement: 06/13/15 Potential to Achieve Goals: Good    Frequency Min 3X/week   Barriers to discharge        Co-evaluation               End of Session Equipment Utilized During Treatment: Gait belt;Oxygen Activity Tolerance: Patient tolerated treatment well Patient left: in chair;with call bell/phone within reach;with chair alarm set Nurse Communication: Mobility status         Time: [id number] PT Time Calculation (min) (ACUTE ONLY): 27 min   Charges:   PT Evaluation $PT Eval Moderate Complexity: 1 Procedure PT Treatments $Gait Training: 8-22 mins $Therapeutic Activity: 8-22 mins   PT G CodesClide Dales 06/14/2015, 4:40 PM  Clide Dales, PT Pager: [phone] 2015-06-14

## 2015-05-30 NOTE — Progress Notes (Signed)
CSW received notification from MD that pt may need rehab at [facility].   PT eval completed today, but full note with recommendations not yet available.   Will review recommendations for PT and assist as appropriate.  Alison Murray, MSW, LCSW Clinical Social Work Weekend coverage [phone]

## 2015-05-30 NOTE — Progress Notes (Signed)
PT Note  Patient Details Name: Dana [name] MRN: [id number] DOB: Dec 21, 1927  Chart reviewed and talked with patient at length. Pt very anxious and frustrated when I entered the room stating, "that doctor is telling me all the things he needs to do and that I may need help or skilled nursing or assisted living, and I know I might but how am I going to pay for it". Pt wanting her medicine at this point so she can better participate with me. I will wait until she gets her medicine to proceed with the assessment due to her being very anxious at this time. Pt apologized and welcomed my return later.    Clide Dales 05/30/2015, 11:34 AM  Clide Dales, PT Pager: [phone] 05/30/2015

## 2015-05-30 NOTE — Progress Notes (Signed)
PT Note  Patient Details Name: Dana [name] MRN: [id number] DOB: [birth year]   Eval completed with patient by 2:21 pm. Pt ambulated with RW with contact guard assist . She states in her Independent apartment she uses a 4 wheeled RW to get to the dining hall, however needs a portable O2 in order to do this. She does drop to 81% within 2 minutes of activity on RA,and returns to 97% on 2L O2 within 1 minutes.  So she is aware she needs her oxygen at all times.  She does understand that she may need to transition to a different place in order to have more care and assist as she may need for she states her health condition will not get any better. She hopes she will hear from her children to see if they will assist. Pt is very sad about not knowing what the DC plan and future may bring.    Would a palliative consult assist with some planning and supportive care?   Full write up to follow soon.   Clide Dales 05/30/2015, 3:25 PM Clide Dales, PT Pager: [phone] 05/30/2015

## 2015-05-30 NOTE — Progress Notes (Signed)
[facility] PROGRESS NOTE  Dana [name] [id number] DOB: 11/26/1927 DOA: 05/28/2015 PCP: Reginia Naas, MD  Assessment/Plan: Acute on chronic respiratory failure with hypoxia -Likely secondary to pulmonary vascular congestion/interstitial edema -secondary to #2 -Continue supplemental oxygen and supportive care -treatment as mentioned below for HF exacerbation  Chronic combined systolic and diastolic heart failure/severe aortic stenosis -Echocardiogram December 2016 showed an EF of A999333, grade 1 diastolic heart failure -Currently does not appear to be significantly volume overloaded -will follow daily weights, intake and output -Continue IV lasix; but will switch to once a day now and hopefully transition to PO in am -UOP over past 24hrs 3.5 L -will continue low dose ACE; holding b-blocker due to low BP  Elevated troponin -from demand ischemia due to Heart failure exacerbation -no further work up as indicated by cardiology in the past -discussed with family, code status transition to DNR -pursuit medical management   Hypokalemia -due to diuresis -will continue repleting as needed   Hypomagnesemia -Continue Mag-Ox  Hypothyroidism -Continue Synthroid  Hyperlipidemia -Continue statin  Anxiety/depression -Continue Wellbutrin/Valium  Physical deconditioning -PT evaluation requested -appears to be candidate for SNF  Code Status: DNR Family Communication: discussed with daughter over the phone  Disposition Plan: Continue IV diuresis for at least another 24 hours, follow patient vital signs in response to initiation of low-dose lisinopril and coreg. Discuss discharge plans with family   Consultants:  None  Procedures:  See below for x-ray reports  Antibiotics:  None  HPI/Subjective: Excellent urine output continues. No CP and breathing easier. Patient understanding needs of rehab at [facility], but reports doesn't have money to pay for it    Objective: Filed Vitals:   05/30/15 1500 05/30/15 1650  BP: 86/45 96/47  Pulse: 83 84  Temp: 97.8 F (36.6 C)   Resp: 16     Intake/Output Summary (Last 24 hours) at 05/30/15 1717 Last data filed at 05/30/15 1023  Gross per 24 hour  Intake    120 ml  Output    475 ml  Net   -355 ml   Filed Weights   05/28/15 2117 05/29/15 0427 05/30/15 0610  Weight: 63.6 kg (140 lb 3.4 oz) 61.7 kg (136 lb 0.4 oz) 63.4 kg (139 lb 12.4 oz)    Exam:   General:  Afebrile, denies chest pain. Patient with less SOB. Good O2 sat on 2L [place].  Cardiovascular: S1 and S2, positive systolic murmur; no rubs or gallops; no JVD appreciated on exam.  Respiratory: mild Bibasilar crackles, no wheezing; no use of accessory muscles and otherwise clear to auscultation.  Abdomen: Soft, nontender, nondistended, positive bowel sounds  Musculoskeletal: No LE edema or cyanosis  Data Reviewed: Basic Metabolic Panel:  Recent Labs Lab 05/28/15 1224 05/29/15 0414  NA 137 136  K 3.4* 3.7  CL 98* 99*  CO2 29 30  GLUCOSE 94 101*  BUN 18 19  CREATININE 0.72 0.91  CALCIUM 8.8* 8.1*   Liver Function Tests:  Recent Labs Lab 05/28/15 1224  AST 28  ALT 24  ALKPHOS 60  BILITOT 0.9  PROT 6.9  ALBUMIN 3.3*   CBC:  Recent Labs Lab 05/28/15 1224  WBC 16.9*  HGB 10.9*  HCT 32.8*  MCV 89.1  PLT 258   Cardiac Enzymes:  Recent Labs Lab 05/28/15 1646  TROPONINI 0.83*   BNP (last 3 results)  Recent Labs  04/24/15 0357 05/15/15 1133 05/28/15 1646  BNP 2132.5* 2294.6* 3253.9*   CBG:  Recent Labs Lab 05/28/15 1237  GLUCAP 80   Studies: No results found.  Scheduled Meds: . buPROPion  300 mg Oral Daily  . calcium citrate  200 mg of elemental calcium Oral Daily  . docusate sodium  100 mg Oral BID  . enoxaparin (LOVENOX) injection  40 mg Subcutaneous Q24H  . furosemide  20 mg Intravenous Daily  . iron polysaccharides  150 mg Oral Daily  . levothyroxine  88 mcg Oral QAC breakfast  .  lisinopril  2.5 mg Oral BID  . pantoprazole  40 mg Oral Daily  . simvastatin  20 mg Oral Daily  . sodium chloride flush  3 mL Intravenous Q12H   Continuous Infusions:   Active Problems:   Acute respiratory failure with hypoxia (HCC)   Hypothyroidism   Severe aortic stenosis   Chronic combined systolic and diastolic CHF (congestive heart failure) (HCC)   Troponin level elevated   Pulmonary edema    Time spent: 30 minutes    Barton Dubois  [facility] Pager [phone]. If 7PM-7AM, please contact night-coverage at [email or web address], password [facility] 05/30/2015, 5:17 PM  LOS: 2 days

## 2015-05-31 LAB — BASIC METABOLIC PANEL
Anion gap: 9 (ref 5–15)
BUN: 22 mg/dL — AB (ref 6–20)
CALCIUM: 8.6 mg/dL — AB (ref 8.9–10.3)
CHLORIDE: 102 mmol/L (ref 101–111)
CO2: 29 mmol/L (ref 22–32)
CREATININE: 0.94 mg/dL (ref 0.44–1.00)
GFR, EST NON AFRICAN AMERICAN: 54 mL/min — AB (ref >60)
Glucose, Bld: 99 mg/dL (ref 65–99)
Potassium: 3.6 mmol/L (ref 3.5–5.1)
SODIUM: 140 mmol/L (ref 135–145)

## 2015-05-31 MED ORDER — FUROSEMIDE 40 MG PO TABS
40.0000 mg | ORAL_TABLET | Freq: Every day | ORAL | Status: DC
  Administered 2015-05-31 – 2015-06-02 (×3): 40 mg via ORAL
  Filled 2015-05-31 (×3): qty 1

## 2015-05-31 MED ORDER — PANTOPRAZOLE SODIUM 40 MG PO TBEC
40.0000 mg | DELAYED_RELEASE_TABLET | Freq: Two times a day (BID) | ORAL | Status: DC
  Administered 2015-05-31 – 2015-06-02 (×4): 40 mg via ORAL
  Filled 2015-05-31 (×4): qty 1

## 2015-05-31 MED ORDER — GI COCKTAIL ~~LOC~~: 30.0000 mL | Freq: Three times a day (TID) | ORAL | Status: DC | PRN

## 2015-05-31 NOTE — Progress Notes (Signed)
[facility] PROGRESS NOTE  Dana [name] [id number] DOB: 1927-10-07 DOA: 05/28/2015 PCP: Reginia Naas, MD  Assessment/Plan: Acute on chronic respiratory failure with hypoxia -Likely secondary to pulmonary vascular congestion/interstitial edema -secondary to #2 -Continue supplemental oxygen and supportive care -treatment as mentioned below for HF exacerbation  Chronic combined systolic and diastolic heart failure/severe aortic stenosis -Echocardiogram December 2016 showed an EF of A999333, grade 1 diastolic heart failure -Currently does not appear to be significantly volume overloaded -will follow daily weights, intake and output -Continue lasix; but will switch to PO and follow response -UOP over past 24hrs 3.5 L -will continue low dose ACE; holding b-blocker due to low BP  Elevated troponin -from demand ischemia due to Heart failure exacerbation -no further work up as indicated by cardiology in the past -discussed with family, code status transition to DNR -pursuit medical management   Hypokalemia -due to diuresis -will continue repleting as needed   Hypomagnesemia -Continue Mag-Ox  Hypothyroidism -Continue Synthroid  Hyperlipidemia -Continue statin  Anxiety/depression -Continue Wellbutrin/Valium  Physical deconditioning -PT evaluation requested; recommending SNF (at least short term) -appears to be candidate for SNF  GERD -will continue PPI and add GI cocktail as needed   Code Status: DNR Family Communication: discussed with daughter over the phone  Disposition Plan: will transition lasix to PO; follow patient vital signs in response to initiation of low-dose lisinopril and if BP better reinitiate low dose coreg. Discuss discharge plans with family. SW aware of SNF    Consultants:  None  Procedures:  See below for x-ray reports  Antibiotics:  None  HPI/Subjective: No CP; breathing close to baseline and no fever. Complaining of  reflux symptoms   Objective: Filed Vitals:   05/31/15 0519 05/31/15 0942  BP: 92/44 111/55  Pulse: 88 89  Temp: 97.8 F (36.6 C)   Resp: 18     Intake/Output Summary (Last 24 hours) at 05/31/15 1253 Last data filed at 05/31/15 1200  Gross per 24 hour  Intake      0 ml  Output    200 ml  Net   -200 ml   Filed Weights   05/29/15 0427 05/30/15 0610 05/31/15 0519  Weight: 61.7 kg (136 lb 0.4 oz) 63.4 kg (139 lb 12.4 oz) 63.9 kg (140 lb 14 oz)    Exam:   General:  Afebrile, denies chest pain and reports breathing is close to baseline. Complaining of reflux. Good O2 sat on 2L [place].  Cardiovascular: S1 and S2, positive systolic murmur; no rubs or gallops; no JVD appreciated on exam.  Respiratory: no frank crackles, no wheezing; no use of accessory muscles and otherwise clear to auscultation.  Abdomen: Soft, nontender, nondistended, positive bowel sounds  Musculoskeletal: No LE edema or cyanosis  Data Reviewed: Basic Metabolic Panel:  Recent Labs Lab 05/28/15 1224 05/29/15 0414 05/31/15 0551  NA 137 136 140  K 3.4* 3.7 3.6  CL 98* 99* 102  CO2 29 30 29   GLUCOSE 94 101* 99  BUN 18 19 22*  CREATININE 0.72 0.91 0.94  CALCIUM 8.8* 8.1* 8.6*   Liver Function Tests:  Recent Labs Lab 05/28/15 1224  AST 28  ALT 24  ALKPHOS 60  BILITOT 0.9  PROT 6.9  ALBUMIN 3.3*   CBC:  Recent Labs Lab 05/28/15 1224  WBC 16.9*  HGB 10.9*  HCT 32.8*  MCV 89.1  PLT 258   Cardiac Enzymes:  Recent Labs Lab 05/28/15 1646  TROPONINI 0.83*   BNP (last 3 results)  Recent Labs  04/24/15 0357 05/15/15 1133 05/28/15 1646  BNP 2132.5* 2294.6* 3253.9*   CBG:  Recent Labs Lab 05/28/15 1237  GLUCAP 80   Studies: No results found.  Scheduled Meds: . buPROPion  300 mg Oral Daily  . calcium citrate  200 mg of elemental calcium Oral Daily  . docusate sodium  100 mg Oral BID  . enoxaparin (LOVENOX) injection  40 mg Subcutaneous Q24H  . furosemide  40 mg Oral Daily   . iron polysaccharides  150 mg Oral Daily  . levothyroxine  88 mcg Oral QAC breakfast  . lisinopril  2.5 mg Oral BID  . pantoprazole  40 mg Oral BID  . simvastatin  20 mg Oral Daily  . sodium chloride flush  3 mL Intravenous Q12H   Continuous Infusions:   Active Problems:   Acute respiratory failure with hypoxia (HCC)   Hypothyroidism   Severe aortic stenosis   Chronic combined systolic and diastolic CHF (congestive heart failure) (HCC)   Troponin level elevated   Pulmonary edema    Time spent: 30 minutes    Barton Dubois  [facility] Pager [phone]. If 7PM-7AM, please contact night-coverage at [email or web address], password [facility] 05/31/2015, 12:53 PM  LOS: 3 days

## 2015-06-01 DIAGNOSIS — R5381 Other malaise: Secondary | ICD-10-CM

## 2015-06-01 NOTE — Progress Notes (Signed)
CSW received consult that pt needing SNF.   CSW met with pt at bedside. CSW introduced self and explained role. CSW discussed recommendation for SNF upon discharge. Pt agreeable to [facility] search. Pt states that she does not want to go back to [facility] because she has been there in the past and had a negative experience. Pt reports that she would like to involved her daughter, Dana [name] in discharge planning process.   CSW completed FL2 and initiated SNF search to [facility] [employer]   CSW contacted pt daughter, Dana [name] via telephone. CSW discussed recommendation with pt daughter. Provided bed offers available at time CSW was able to reach daughter and Pt daughter will discuss with pt and pt sons.   CSW to follow up tomorrow morning to discuss decision and assist with discharge planning needs.   Dana [name], MSW, [facility] [phone]

## 2015-06-01 NOTE — Clinical Social Work Placement (Signed)
   CLINICAL SOCIAL WORK PLACEMENT  NOTE  Date:  06/01/2015  Patient Details  Name: Dana [name] MRN: [id number] Date of Birth: 22-Apr-1927  Clinical Social Work is seeking post-discharge placement for this patient at the [place] level of care (*CSW will initial, date and re-position this form in  chart as items are completed):  Yes   Patient/family provided with [facility] Department's list of facilities offering this level of care within the geographic area requested by the patient (or if unable, by the patient's family).  Yes   Patient/family informed of their freedom to choose among providers that offer the needed level of care, that participate in Medicare, Medicaid or managed care program needed by the patient, have an available bed and are willing to accept the patient.  Yes   Patient/family informed of [facility]'s ownership interest in [facility] and [facility], as well as of the fact that they are under no obligation to receive care at these facilities.  PASRR submitted to EDS on       PASRR number received on       Existing PASRR number confirmed on 06/01/15     FL2 transmitted to all facilities in geographic area requested by pt/family on 06/01/15     FL2 transmitted to all facilities within larger geographic area on       Patient informed that his/her managed care company has contracts with or will negotiate with certain facilities, including the following:            Patient/family informed of bed offers received.  Patient chooses bed at       Physician recommends and patient chooses bed at      Patient to be transferred to   on  .  Patient to be transferred to facility by       Patient family notified on   of transfer.  Name of family member notified:        PHYSICIAN Please sign FL2, Please sign DNR     Additional Comment:    _______________________________________________ Ladell Pier,  LCSW 06/01/2015, 11:58 AM

## 2015-06-01 NOTE — Progress Notes (Signed)
Physical Therapy Treatment Patient Details Name: ALIYYAH TRENKAMP MRN: [id number] DOB: [birth year] Today's Date: 06/01/2015    History of Present Illness BRANDILYNN STOCKBURGER is a 82 y.o. female Recently hospitalized from 2/2- 04/29/2015 with acute respiratory failure with hypoxia due to acute combined systolic and diastolic CHF, complicated with enterococcus UTI, brought by ambulance from her nursing home with worsening SOB, worse on ambulation. She had recently finished antibiotics course for PNA. She was supposed to have home O2 but had not received the tubing at the time of admission and family dynamics    PT Comments    The patient is very pleasant and not anxious today. She stated that she ordered toast for breakfast. Called  Down and  There was no record. Patient is satting >93% on RA during mobility. BP  Pre 108/48, post 119/54. No c/o dizziness.  Patient will benefit from  [facility] Rehab post acute.  Follow Up Recommendations  SNF;Supervision/Assistance - 24 hour     Equipment Recommendations  None recommended by PT    Recommendations for Other Services       Precautions / Restrictions Precautions Precautions: Fall Precaution Comments: pt reports significant h/o multiple falls. Monitor VS    Mobility  Bed Mobility   Bed Mobility: Supine to Sit     Supine to sit: Min assist Sit to supine: Min assist   General bed mobility comments: needed assist to sit up, extra time to get momentum to scoot to edge, assist with the legs" Just fling them up",  Transfers Overall transfer level: Needs assistance Equipment used: 1 person hand held assist Transfers: Sit to/from Stand Sit to Stand: Min assist         General transfer comment: min assist  to steady and rise safely from bed, multimodal cues for use of the rail beside toilet,  Ambulation/Gait Ambulation/Gait assistance: Min assist Ambulation Distance (Feet): 20 Feet (x 2) Assistive device: 1 person hand held assist Gait  Pattern/deviations: Step-through pattern;Staggering right;Staggering left;Trunk flexed Gait velocity: decreased   General Gait Details: cruising on bed, door frame , RW parkwed in the room. declined to use the RW.     Stairs            Wheelchair Mobility    Modified Rankin (Stroke Patients Only)       Balance Overall balance assessment: History of Falls;Needs assistance Sitting-balance support: Feet supported;No upper extremity supported Sitting balance-Leahy Scale: Good     Standing balance support: During functional activity;No upper extremity supported Standing balance-Leahy Scale: Poor Standing balance comment: with 1 UE support able to perform self pericare in front but required assistance to  perform rear.                    Cognition Arousal/Alertness: Awake/alert   Overall Cognitive Status: History of cognitive impairments - at baseline Area of Impairment: Safety/judgement;Problem solving     Memory: Decreased short-term memory       Problem Solving: Requires verbal cues General Comments: states' you have  to help me remeber", she did  state that she is going to rehab.    Exercises      General Comments        Pertinent Vitals/Pain Pain Assessment: Faces Faces Pain Scale: Hurts little more Pain Location: back, "sciatica" Pain Descriptors / Indicators: Radiating Pain Intervention(s): Limited activity within patient's tolerance;Monitored during session    Home Living  Prior Function            PT Goals (current goals can now be found in the care plan section) Progress towards PT goals: Progressing toward goals    Frequency  Min 3X/week    PT Plan Current plan remains appropriate    Co-evaluation             End of Session Equipment Utilized During Treatment: Gait belt Activity Tolerance: Patient limited by fatigue Patient left: in bed;with call bell/phone within reach;with bed alarm set      Time: 0922-0949 PT Time Calculation (min) (ACUTE ONLY): 27 min  Charges:  $Gait Training: 8-22 mins $Self Care/Home Management: 8-22                    G Codes:      Claretha Cooper 06/01/2015, 10:00 AM Tresa Endo PT [phone]

## 2015-06-01 NOTE — Care Management Important Message (Signed)
Important Message  Patient Details IM Letter given to Cookie/Case Manager to present to Patient Name: Dana [name] MRN: [id number] Date of Birth: 25-Aug-1927   Medicare Important Message Given:  Yes    Camillo Flaming 06/01/2015, 10:31 AMImportant Message  Patient Details  Name: Dana [name] MRN: [id number] Date of Birth: Jul 21, 1927   Medicare Important Message Given:  Yes    Camillo Flaming 06/01/2015, 10:31 AM

## 2015-06-01 NOTE — Progress Notes (Signed)
[facility] PROGRESS NOTE  Dana [name] [id number] DOB: [birth year] DOA: 05/28/2015 PCP: Reginia Naas, MD  Assessment/Plan: Acute on chronic respiratory failure with hypoxia -Likely secondary to pulmonary vascular congestion/interstitial edema -secondary to #2 -Continue supplemental oxygen and supportive care -treatment as mentioned below for HF exacerbation  Chronic combined systolic and diastolic heart failure/severe aortic stenosis -Echocardiogram December 2016 showed an EF of A999333, grade 1 diastolic heart failure -Currently does not appear to be significantly volume overloaded -will follow daily weights, intake and output -Continue lasix; but will switch to PO and follow response -UOP over past 24hrs 3.5 L -will continue low dose ACE; holding b-blocker due to low BP  Elevated troponin -from demand ischemia due to Heart failure exacerbation -no further work up as indicated by cardiology in the past -discussed with family, code status transition to DNR -pursuit medical management   Hypokalemia -due to diuresis -will continue repleting as needed   Hypomagnesemia -Continue Mag-Ox  Hypothyroidism -Continue Synthroid  Hyperlipidemia -Continue statin  Anxiety/depression -Continue Wellbutrin/Valium  Physical deconditioning -PT evaluation requested; recommending SNF (at least short term) -appears to be candidate for SNF  GERD -will continue PPI and add GI cocktail as needed  -improvement in her symptoms reported   Code Status: DNR Family Communication: discussed with daughter over the phone  Disposition Plan: will transition lasix to PO; follow patient vital signs in response to initiation of low-dose lisinopril and if BP better reinitiate low dose coreg. Discuss discharge plans with family. SW aware of SNF    Consultants:  None  Procedures:  See below for x-ray reports  Antibiotics:  None  HPI/Subjective: No CP; breathing essentially  at baseline and reporting significant improvement in her reflux symptoms.   Objective: Filed Vitals:   06/01/15 0625 06/01/15 2127  BP: 102/44 94/47  Pulse: 85 93  Temp: 97.3 F (36.3 C) 97.2 F (36.2 C)  Resp: 18 18    Intake/Output Summary (Last 24 hours) at 06/01/15 2129 Last data filed at 06/01/15 1950  Gross per 24 hour  Intake      0 ml  Output   1100 ml  Net  -1100 ml   Filed Weights   05/30/15 0610 05/31/15 0519 06/01/15 0625  Weight: 63.4 kg (139 lb 12.4 oz) 63.9 kg (140 lb 14 oz) 61.7 kg (136 lb 0.4 oz)    Exam:   General:  Afebrile, denies chest pain and reports breathing is essentially at baseline. GERD/reflux improved. Good O2 sat on 2L [place].  Cardiovascular: S1 and S2, positive systolic murmur; no rubs or gallops; no JVD appreciated on exam.  Respiratory: no frank crackles, no wheezing; no use of accessory muscles and otherwise clear to auscultation.  Abdomen: Soft, nontender, nondistended, positive bowel sounds  Musculoskeletal: No LE edema or cyanosis  Data Reviewed: Basic Metabolic Panel:  Recent Labs Lab 05/28/15 1224 05/29/15 0414 05/31/15 0551  NA 137 136 140  K 3.4* 3.7 3.6  CL 98* 99* 102  CO2 29 30 29   GLUCOSE 94 101* 99  BUN 18 19 22*  CREATININE 0.72 0.91 0.94  CALCIUM 8.8* 8.1* 8.6*   Liver Function Tests:  Recent Labs Lab 05/28/15 1224  AST 28  ALT 24  ALKPHOS 60  BILITOT 0.9  PROT 6.9  ALBUMIN 3.3*   CBC:  Recent Labs Lab 05/28/15 1224  WBC 16.9*  HGB 10.9*  HCT 32.8*  MCV 89.1  PLT 258   Cardiac Enzymes:  Recent Labs Lab 05/28/15 1646  TROPONINI 0.83*  BNP (last 3 results)  Recent Labs  04/24/15 0357 05/15/15 1133 05/28/15 1646  BNP 2132.5* 2294.6* 3253.9*   CBG:  Recent Labs Lab 05/28/15 1237  GLUCAP 80   Studies: No results found.  Scheduled Meds: . buPROPion  300 mg Oral Daily  . calcium citrate  200 mg of elemental calcium Oral Daily  . docusate sodium  100 mg Oral BID  .  enoxaparin (LOVENOX) injection  40 mg Subcutaneous Q24H  . furosemide  40 mg Oral Daily  . iron polysaccharides  150 mg Oral Daily  . levothyroxine  88 mcg Oral QAC breakfast  . lisinopril  2.5 mg Oral BID  . pantoprazole  40 mg Oral BID  . simvastatin  20 mg Oral Daily  . sodium chloride flush  3 mL Intravenous Q12H   Continuous Infusions:   Active Problems:   Acute respiratory failure with hypoxia (HCC)   Hypothyroidism   Severe aortic stenosis   Chronic combined systolic and diastolic CHF (congestive heart failure) (HCC)   Troponin level elevated   Pulmonary edema    Time spent: 30 minutes    Barton Dubois  [facility] Pager [phone]. If 7PM-7AM, please contact night-coverage at [email or web address], password [facility] 06/01/2015, 9:29 PM  LOS: 4 days

## 2015-06-01 NOTE — NC FL2 (Signed)
[place] LEVEL OF CARE SCREENING TOOL     IDENTIFICATION  Patient Name: Dana [name] Birthdate: [birth year] Sex: female Admission Date (Current Location): 05/28/2015  [facility] and [place] Number:  Herbalist and Address:  [facility],  [place] [address], [place]      Provider Number: [id number]  Attending Physician Name and Address:  Barton Dubois, MD  Relative Name and Phone Number:       Current Level of Care: Hospital Recommended Level of Care: [place] Prior Approval Number:    Date Approved/Denied:   PASRR Number: [id number]  Discharge Plan: SNF    Current Diagnoses: Patient Active Problem List   Diagnosis Date Noted  . Troponin level elevated 05/28/2015  . Pulmonary edema 05/28/2015  . Confusional state   . Chronic combined systolic and diastolic CHF (congestive heart failure) ([place])   . Hypokalemia   . Hypomagnesemia   . Other specified hypothyroidism   . HLD (hyperlipidemia)   . Anxiety state   . Depression   . CHF (congestive heart failure) ([place]) 05/15/2015  . Fever 05/15/2015  . Acute systolic congestive heart failure ([place])   . Cognitive disorder 04/28/2015  . Adjustment disorder with mixed anxiety and depressed mood 04/28/2015  . Acute UTI 04/23/2015  . ? Dementia 04/23/2015  . Acute hypokalemia 04/23/2015  . Dry mouth 04/23/2015  . Severe aortic stenosis 04/23/2015  . Pulmonary hypertension ([place]) 04/23/2015  . Leukocytosis 04/23/2015  . QT prolongation 04/23/2015  . Acute combined systolic and diastolic CHF, NYHA class 1 ([place]) 04/23/2015  . Acute combined systolic and diastolic congestive heart failure ([place]) 03/04/2015  . HCAP (healthcare-associated pneumonia) 02/27/2015  . Acute respiratory failure with hypoxia ([place]) 02/24/2015  . CAP (community acquired pneumonia) 02/24/2015  . Tachycardia 02/24/2015  . Falls frequently 02/24/2015  . Hypothyroidism   . Chronic back pain   .  History of breast cancer   . History of peptic ulcer   . Anxiety     Orientation RESPIRATION BLADDER Height & Weight     Self, Time, Situation, Place  O2 (2L O2) Continent Weight: 136 lb 0.4 oz (61.7 kg) Height:  5' (152.4 cm)  BEHAVIORAL SYMPTOMS/MOOD NEUROLOGICAL BOWEL NUTRITION STATUS   (no behaviors)  (NONE) Continent Diet (Diet Heart)  AMBULATORY STATUS COMMUNICATION OF NEEDS Skin   Limited Assist Verbally Normal                       Personal Care Assistance Level of Assistance  Bathing, Feeding, Dressing Bathing Assistance: Limited assistance Feeding assistance: Independent Dressing Assistance: Limited assistance     Functional Limitations Info  Sight, Hearing, Speech Sight Info: Impaired Hearing Info: Impaired Speech Info: Adequate    SPECIAL CARE FACTORS FREQUENCY  PT (By licensed PT), OT (By licensed OT)     PT Frequency: 5 x a week OT Frequency: 5 x a week            Contractures Contractures Info: Not present    Additional Factors Info  Code Status, Allergies, Isolation Precautions Code Status Info: DNR code status Allergies Info: Amoxicillin, Amoxicillin-pot Clavulanate, Azithromycin, Celexa, Iohexol, Pollen Extract, Prozac, Levaquin, Promethazine, Shrimp     Isolation Precautions Info: Contact precautions: 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  0.9 %  sodium chloride infusion  250 mL Intravenous PRN Roney Jaffe, MD      . acetaminophen (TYLENOL) tablet 650 mg  650 mg Oral Q6H PRN Roney Jaffe, MD   650 mg at 06/01/15 1046   Or  . acetaminophen (TYLENOL) suppository 650 mg  650 mg Rectal Q6H PRN Roney Jaffe, MD      . bisacodyl (DULCOLAX) suppository 10 mg  10 mg Rectal Daily PRN Roney Jaffe, MD      .  buPROPion (WELLBUTRIN XL) 24 hr tablet 300 mg  300 mg Oral Daily Roney Jaffe, MD   300 mg at 06/01/15 1040  . calcium citrate (CALCITRATE - dosed in mg elemental calcium) tablet 200 mg of elemental calcium  200 mg of elemental calcium Oral Daily Roney Jaffe, MD   200 mg of elemental calcium at 06/01/15 1040  . diazepam (VALIUM) tablet 2.5 mg  2.5 mg Oral Q12H PRN Barton Dubois, MD   2.5 mg at 05/30/15 2307  . docusate sodium (COLACE) capsule 100 mg  100 mg Oral BID Roney Jaffe, MD   100 mg at 06/01/15 1042  . enoxaparin (LOVENOX) injection 40 mg  40 mg Subcutaneous Q24H Roney Jaffe, MD   40 mg at 05/31/15 2309  . furosemide (LASIX) tablet 40 mg  40 mg Oral Daily Barton Dubois, MD   40 mg at 06/01/15 1040  . gi cocktail (Maalox,Lidocaine,Donnatal)  30 mL Oral TID PRN Barton Dubois, MD      . iron polysaccharides (NIFEREX) capsule 150 mg  150 mg Oral Daily Roney Jaffe, MD   150 mg at 06/01/15 1040  . levothyroxine (SYNTHROID, LEVOTHROID) tablet 88 mcg  88 mcg Oral QAC breakfast Roney Jaffe, MD   88 mcg at 06/01/15 0819  . lisinopril (PRINIVIL,ZESTRIL) tablet 2.5 mg  2.5 mg Oral BID Barton Dubois, MD   2.5 mg at 05/31/15 0942  . ondansetron (ZOFRAN) tablet 4 mg  4 mg Oral Q6H PRN Roney Jaffe, MD       Or  . ondansetron [facility]) injection 4 mg  4 mg Intravenous Q6H PRN Roney Jaffe, MD      . pantoprazole (PROTONIX) EC tablet 40 mg  40 mg Oral BID Barton Dubois, MD   40 mg at 06/01/15 1040  . polyethylene glycol (MIRALAX / GLYCOLAX) packet 17 g  17 g Oral Daily PRN Roney Jaffe, MD      . simvastatin (ZOCOR) tablet 20 mg  20 mg Oral Daily Roney Jaffe, MD   20 mg at 06/01/15 1040  . sodium chloride flush (NS) 0.9 % injection 3 mL  3 mL Intravenous Q12H Roney Jaffe, MD   3 mL at 06/01/15 0008  . sodium chloride flush (NS) 0.9 % injection 3 mL  3 mL Intravenous PRN Roney Jaffe, MD         Discharge Medications: Please see discharge summary for a list of discharge  medications.  Relevant Imaging Results:  Relevant Lab Results:   Additional Information SSN:  [id number]  Lilyanne Mcquown A, LCSW

## 2015-06-02 DIAGNOSIS — J9621 Acute and chronic respiratory failure with hypoxia: Secondary | ICD-10-CM | POA: Insufficient documentation

## 2015-06-02 DIAGNOSIS — I5043 Acute on chronic combined systolic (congestive) and diastolic (congestive) heart failure: Secondary | ICD-10-CM | POA: Insufficient documentation

## 2015-06-02 LAB — BASIC METABOLIC PANEL
Anion gap: 10 (ref 5–15)
BUN: 20 mg/dL (ref 6–20)
CHLORIDE: 98 mmol/L — AB (ref 101–111)
CO2: 31 mmol/L (ref 22–32)
Calcium: 9 mg/dL (ref 8.9–10.3)
Creatinine, Ser: 0.96 mg/dL (ref 0.44–1.00)
GFR calc Af Amer: >60 mL/min (ref >60)
GFR calc non Af Amer: 52 mL/min — ABNORMAL LOW (ref >60)
Glucose, Bld: 99 mg/dL (ref 65–99)
POTASSIUM: 3.3 mmol/L — AB (ref 3.5–5.1)
SODIUM: 139 mmol/L (ref 135–145)

## 2015-06-02 MED ORDER — LISINOPRIL 2.5 MG PO TABS: 2.5000 mg | ORAL_TABLET | Freq: Two times a day (BID) | ORAL | Status: DC

## 2015-06-02 MED ORDER — FUROSEMIDE 40 MG PO TABS: 40.0000 mg | ORAL_TABLET | Freq: Every day | ORAL | Status: DC

## 2015-06-02 MED ORDER — POTASSIUM CHLORIDE ER 10 MEQ PO TBCR: 20.0000 meq | EXTENDED_RELEASE_TABLET | Freq: Every day | ORAL | Status: AC

## 2015-06-02 MED ORDER — OMEPRAZOLE 40 MG PO CPDR: 40.0000 mg | DELAYED_RELEASE_CAPSULE | Freq: Two times a day (BID) | ORAL | Status: AC

## 2015-06-02 MED ORDER — DIAZEPAM 5 MG PO TABS: 10.0000 mg | ORAL_TABLET | Freq: Two times a day (BID) | ORAL | Status: AC | PRN

## 2015-06-02 NOTE — Consult Note (Signed)
   [facility] CM Inpatient Consult   06/02/2015  Dana [name] January 27, 1928 [id number]    Patient screened for [place] Management services. Reviewed in EPIC notes that patient's disposition is for SNF. Confirmed disposition plans with inpatient RNCM. Of note, patient recently refused [facility] Care Management services with [facility] RNCM. Please see chart review tab then notes to see details. Will not re-engage for [facility] Care Management services at this time.   Marthenia Rolling, MSN-Ed, RN,BSN [facility] Liaison [phone]

## 2015-06-02 NOTE — Progress Notes (Signed)
Spoke with pt's daughter [name] [phone] concerning discharge to SNF. She will go look at [facility], then call with her choice. Explain that SNF's  Request that pt's come by 4 PM.

## 2015-06-02 NOTE — Progress Notes (Addendum)
Daughter Dana [name] selected [facility] for SNF and she will transport her. Dana [name] will also bring O2 tank with her.  CSW to follow.

## 2015-06-02 NOTE — Clinical Social Work Placement (Signed)
Patient is set to discharge to [facility] today. Patient & daughter, [name] aware. Discharge packet given to RN, [employer]. Patient's daughter, [name] to transport to SNF.     Raynaldo Opitz, [facility] Clinical Social Worker cell #: [phone]    CLINICAL SOCIAL WORK PLACEMENT  NOTE  Date:  06/02/2015  Patient Details  Name: Dana [name] MRN: [id number] Date of Birth: 25-Nov-1927  Clinical Social Work is seeking post-discharge placement for this patient at the [employer] level of care (*CSW will initial, date and re-position this form in  chart as items are completed):  Yes   Patient/family provided with [place] Work Department's list of facilities offering this level of care within the geographic area requested by the patient (or if unable, by the patient's family).  Yes   Patient/family informed of their freedom to choose among providers that offer the needed level of care, that participate in Medicare, Medicaid or managed care program needed by the patient, have an available bed and are willing to accept the patient.  Yes   Patient/family informed of [place]'s ownership interest in [facility] and [facility], as well as of the fact that they are under no obligation to receive care at these facilities.  PASRR submitted to EDS on       PASRR number received on       Existing PASRR number confirmed on 06/01/15     FL2 transmitted to all facilities in geographic area requested by pt/family on 06/01/15     FL2 transmitted to all facilities within larger geographic area on       Patient informed that his/her managed care company has contracts with or will negotiate with certain facilities, including the following:        Yes   Patient/family informed of bed offers received.  Patient chooses bed at [facility]     Physician recommends and patient chooses bed at      Patient to be transferred to  [facility] on 06/02/15.  Patient to be transferred to facility by patient's daughter, [name]     Patient family notified on 06/02/15 of transfer.  Name of family member notified:  patient's daughter, [name]     PHYSICIAN Please sign FL2, Please sign DNR     Additional Comment:    _______________________________________________ Standley Brooking, LCSW 06/02/2015, 4:21 PM

## 2015-06-02 NOTE — Progress Notes (Signed)
Report called to [place] at [facility]. Questions concerns denied. Daughter will arrive to transport patient to facility.

## 2015-06-02 NOTE — Discharge Summary (Signed)
Physician Discharge Summary  Dana [name] CNO:[id number] DOB: 08/29/1927 DOA: 05/28/2015  PCP: Reginia Naas, MD  Admit date: 05/28/2015 Discharge date: 06/02/2015  Time spent: 40 minutes  Recommendations for Outpatient Follow-up:  Follow daily weights and low sodium diet (less than 2 gram daily) Oxygen supplementation 2L Physical rehabilitation and conditioning as per SNF protocol BMET in 5 days to follow electrolytes and renal function   Discharge Diagnoses:  Active Problems:   Acute respiratory failure with hypoxia (HCC)   Hypothyroidism   Severe aortic stenosis   Chronic combined systolic and diastolic CHF (congestive heart failure) (HCC)   Troponin level elevated   Pulmonary edema   Discharge Condition: stable and improved. Discharge to SNF for physical rehab and further care. Patient to follow up with cardiology service as an outpatient.  Diet recommendation: heart healthy diet   Filed Weights   05/31/15 0519 06/01/15 0625 06/02/15 0556  Weight: 63.9 kg (140 lb 14 oz) 61.7 kg (136 lb 0.4 oz) 60.6 kg (133 lb 9.6 oz)    History of present illness:  As per H&P written by Dr. Jonnie Finner on 05/28/15 82 y.o. female with hx of aortic stenosis, CHF, depression /anxiety, remote BRCA (1970), hx NHL, migraine, hypothyroidism, back surgery found down on the floor at the ALF today and brought to ED. In ED BP normal, HR 95, RR 16- 30, temp 98.5, 97% Sa)2 on 3L [place]. She was recently admitted with resp failure and dc'd on home O2. CXR showing pulm edema bilaterally.   Hospital Course:  Acute on chronic respiratory failure with hypoxia -Likely secondary to pulmonary vascular congestion/interstitial edema -secondary to #2 -Continue supplemental oxygen and supportive care -treatment as mentioned below for HF exacerbation  Chronic combined systolic and diastolic heart failure/severe aortic stenosis -Echocardiogram December 2016 showed an EF of 29-47%, grade 1 diastolic heart  failure -Will recommend follow daily weights and low sodium diet  -Continue lasix PO 21m daily -close follow up of BMET for renal function an electrolytes evaluation -will continue low dose ACE; holding b-blocker due to low BP  Elevated troponin -from demand ischemia due to Heart failure exacerbation -no further work up as indicated by cardiology in the past -discussed with family, code status transition to DNR -pursuit medical management   Hypokalemia -due to diuresis -will continue repleting as needed   Hypomagnesemia -Continue Mag-Ox  Hypothyroidism -Continue Synthroid  Hyperlipidemia -Continue statin  Anxiety/depression -Continue Wellbutrin/Valium  Physical deconditioning -PT evaluation requested; recommending SNF (at least short term)  GERD -will continue PPI BID  Procedures:  See below for x-ray reports   Consultations:  None   Discharge Exam: Filed Vitals:   06/02/15 0556 06/02/15 1405  BP: 102/33 93/50  Pulse: 87 95  Temp: 97.6 F (36.4 C) 97.7 F (36.5 C)  Resp: 18 18    General: Afebrile, denies chest pain and reports breathing is essentially at baseline. GERD/reflux improved. Good O2 sat on 2L [place].  Cardiovascular: S1 and S2, positive systolic murmur; no rubs or gallops; no JVD appreciated on exam.  Respiratory: no frank crackles, no wheezing; no use of accessory muscles and otherwise clear to auscultation.  Abdomen: Soft, nontender, nondistended, positive bowel sounds  Musculoskeletal: No LE edema or cyanosis  Discharge Instructions   Discharge Instructions    Diet - low sodium heart healthy    Complete by:  As directed      Discharge instructions    Complete by:  As directed   Take medications as prescribed Follow daily  weights and low sodium diet (less than 2 gram daily) Oxygen supplementation 2L Physical rehabilitation and conditioning as per SNF protocol BMET in 5 days to follow electrolytes and renal function           Current Discharge Medication List    START taking these medications   Details  lisinopril (PRINIVIL,ZESTRIL) 2.5 MG tablet Take 1 tablet (2.5 mg total) by mouth 2 (two) times daily.    potassium chloride (K-DUR) 10 MEQ tablet Take 2 tablets (20 mEq total) by mouth daily.      CONTINUE these medications which have CHANGED   Details  diazepam (VALIUM) 5 MG tablet Take 2 tablets (10 mg total) by mouth every 12 (twelve) hours as needed for anxiety. Qty: 20 tablet, Refills: 0    furosemide (LASIX) 40 MG tablet Take 1 tablet (40 mg total) by mouth daily.    omeprazole (PRILOSEC) 40 MG capsule Take 1 capsule (40 mg total) by mouth 2 (two) times daily before a meal.      CONTINUE these medications which have NOT CHANGED   Details  buPROPion (WELLBUTRIN XL) 150 MG 24 hr tablet Take 300 mg by mouth daily.     calcium citrate (CALCITRATE - DOSED IN MG ELEMENTAL CALCIUM) 950 MG tablet Take 200 mg of elemental calcium by mouth daily.    docusate sodium (COLACE) 100 MG capsule Take 100 mg by mouth 2 (two) times daily.    iron polysaccharides (NIFEREX) 150 MG capsule Take 150 mg by mouth daily.    levothyroxine (SYNTHROID) 88 MCG tablet Take 88 mcg by mouth daily before breakfast.    loratadine (CLARITIN) 10 MG tablet Take 10 mg by mouth daily.    polyethylene glycol powder (MIRALAX) powder Take 1 Container by mouth daily as needed for mild constipation.     simvastatin (ZOCOR) 20 MG tablet Take 20 mg by mouth daily.      STOP taking these medications     ibuprofen (ADVIL,MOTRIN) 200 MG tablet      permethrin (ELIMITE) 5 % cream      cefUROXime (CEFTIN) 250 MG tablet      predniSONE (DELTASONE) 20 MG tablet        Allergies  Allergen Reactions  . Amoxicillin Other (See Comments)    Excessive sweating   . Amoxicillin-Pot Clavulanate Other (See Comments)  . Azithromycin Other (See Comments)    Numbness/pain in fingers  . Celexa [Citalopram Hydrobromide]     unknown  .  Iohexol      Desc: will need premedicated   . Pollen Extract   . Prozac [Fluoxetine Hcl]     unknown  . Levaquin [Levofloxacin In D5w] Rash  . Promethazine Other (See Comments)    Restlessness    . Shrimp [Shellfish Allergy] Itching   Follow-up Information    Follow up with Reginia Naas, MD. Schedule an appointment as soon as possible for a visit in 1 week.   Specialty:  Family Medicine   Contact information:   [address] [place] [address] [phone]        The results of significant diagnostics from this hospitalization (including imaging, microbiology, ancillary and laboratory) are listed below for reference.    Significant Diagnostic Studies: Dg Chest 2 View  05/28/2015  CLINICAL DATA:  Hypoxemia EXAM: CHEST  2 VIEW COMPARISON:  05/15/2015 FINDINGS: Progression of bilateral airspace disease most consistent with pulmonary edema. There is underlying chronic lung disease with superimposed acute findings difficult to  quantify. No significant effusion. Cardiac enlargement. IMPRESSION: Chronic lung disease. Progression of bilateral airspace disease suggestive of superimposed edema. Electronically Signed   By: Franchot Gallo M.D.   On: 05/28/2015 16:47   Ct Head Wo Contrast  05/28/2015  CLINICAL DATA:  Nausea, vomiting, decreased p.o. intake for 4 days. Likely status post fall. Altered mental status. EXAM: CT HEAD WITHOUT CONTRAST TECHNIQUE: Contiguous axial images were obtained from the base of the skull through the vertex without intravenous contrast. COMPARISON:  Head CT dated 03/14/2014. FINDINGS: Again noted is generalized brain atrophy, with asymmetrically prominent frontal lobe atrophy, with commensurate dilatation of the ventricles and sulci. There is no mass, hemorrhage, edema or other evidence of acute parenchymal abnormality. No extra-axial hemorrhage. No osseous fracture or dislocation. Superficial soft tissues are unremarkable. IMPRESSION: No acute  findings. No intracranial mass, hemorrhage, or edema. No skull fracture. Generalized brain atrophy, but with asymmetrically prominent frontal lobe atrophy, similar to previous head CT of 03/14/2014. Electronically Signed   By: Franki Cabot M.D.   On: 05/28/2015 13:42   Dg Chest Portable 1 View  05/15/2015  CLINICAL DATA:  Shortness of breath EXAM: PORTABLE CHEST 1 VIEW COMPARISON:  04/24/2015; 04/23/2015; 05/04/2014 ; 03/14/2014 FINDINGS: Grossly unchanged enlarged cardiac silhouette and mediastinal contours with atherosclerotic plaque within a tortuous thoracic aorta. There is persistent rightward deviation of the tracheal air column at the level of the aortic arch, potentially accentuated due to patient rotation. Worsening left basilar heterogeneous opacities. Pulmonary vascular remains indistinct with cephalization of flow, most conspicuous in the right upper and mid lung, potentially accentuated due to patient rotation. Trace pleural effusions are not excluded. No pneumothorax. Unchanged bones. IMPRESSION: Findings worrisome for residual / recurrent pulmonary edema with worsening left basilar opacities, atelectasis versus infiltrate. Further evaluation with a PA and lateral chest radiograph may be obtained as clinically indicated. Electronically Signed   By: Sandi Mariscal M.D.   On: 05/15/2015 12:02   Labs: Basic Metabolic Panel:  Recent Labs Lab 05/28/15 1224 05/29/15 0414 05/31/15 0551 06/02/15 0447  NA 137 136 140 139  K 3.4* 3.7 3.6 3.3*  CL 98* 99* 102 98*  CO2 '29 30 29 31  ' GLUCOSE 94 101* 99 99  BUN 18 19 22* 20  CREATININE 0.72 0.91 0.94 0.96  CALCIUM 8.8* 8.1* 8.6* 9.0   Liver Function Tests:  Recent Labs Lab 05/28/15 1224  AST 28  ALT 24  ALKPHOS 60  BILITOT 0.9  PROT 6.9  ALBUMIN 3.3*   CBC:  Recent Labs Lab 05/28/15 1224  WBC 16.9*  HGB 10.9*  HCT 32.8*  MCV 89.1  PLT 258   Cardiac Enzymes:  Recent Labs Lab 05/28/15 1646  TROPONINI 0.83*   BNP: BNP  (last 3 results)  Recent Labs  04/24/15 0357 05/15/15 1133 05/28/15 1646  BNP 2132.5* 2294.6* 3253.9*   CBG:  Recent Labs Lab 05/28/15 1237  GLUCAP 80    Signed:  Barton Dubois MD.  [facility] 06/02/2015, 2:50 PM

## 2015-06-22 ENCOUNTER — Ambulatory Visit (INDEPENDENT_AMBULATORY_CARE_PROVIDER_SITE_OTHER): Payer: Medicare Other | Admitting: Cardiovascular Disease

## 2015-06-22 ENCOUNTER — Encounter: Payer: Self-pay | Admitting: Cardiovascular Disease

## 2015-06-22 VITALS — BP 80/50 | HR 60 | Ht 60.0 in | Wt 130.0 lb

## 2015-06-22 DIAGNOSIS — F0391 Unspecified dementia with behavioral disturbance: Secondary | ICD-10-CM

## 2015-06-22 DIAGNOSIS — I350 Nonrheumatic aortic (valve) stenosis: Secondary | ICD-10-CM | POA: Diagnosis not present

## 2015-06-22 NOTE — Patient Instructions (Addendum)
Medication Instructions:  Your physician has recommended you make the following change in your medication:  1-STOP lisinopril  1-STOP lasix  Labwork: NONE  Testing/Procedures: NONE  Follow-Up: Your physician recommends that you follow-up as needed.  If you need a refill on your cardiac medications before your next appointment, please call your pharmacy.

## 2015-06-22 NOTE — Progress Notes (Signed)
Cardiology Office Note   Date:  06/22/2015   ID:  Dana [name], DOB [birth year], MRN [id number]  PCP:  Dana Naas, MD  Cardiologist:   Dana [name] [facility] consult , Feb. 2017)   Chief Complaint  Patient presents with  . Shortness of Breath    CHF, aortic stenosis    Problem list 1. Severe aortic stenosis 2. Congestive heart failure 3. Moderate dementia 4. Hypothyroidism   History of Present Illness: Dana [name] is a 82 y.o. female who presents for evaluation of her CHF and aortic stenosis. She has been seen By Dr. Angelena Form in the hospital . Was not a candidate for TAVR at that time . Was sent home on Lisinopril.   BP has been low    Echo:  Left ventricle: The cavity size was normal. Wall thickness was  normal. Systolic function was mildly to moderately reduced. The  estimated ejection fraction was in the range of 40% to 45%.  Diffuse hypokinesis. Doppler parameters are consistent with  abnormal left ventricular relaxation (grade 1 diastolic  dysfunction). - Aortic valve: Moderately calcified annulus. Trileaflet; severely  calcified leaflets. Cusp separation was reduced. There was severe  stenosis. There was moderate regurgitation. Mean gradient (S): 39  mm Hg. Peak gradient (S): 72 mm Hg. VTI ratio of LVOT to aortic  valve: 0.16. Valve area (VTI): 0.37 cm^2. Valve area (Vmax): 0.43  cm^2. - Mitral valve: Calcified annulus. There was trivial regurgitation. - Left atrium: The atrium was at the upper limits of normal in  size. - Right atrium: Central venous pressure (est): 3 mm Hg. - Atrial septum: No defect or patent foramen ovale was identified. - Tricuspid valve: There was mild regurgitation. - Pulmonary arteries: PA peak pressure: 43 mm Hg (S). - Pericardium, extracardiac: There was no pericardial effusion.  Impressions:  - Normal LV wall thickness with LVEF 40-45% and grade 1 diastolic  dysfunction. Upper normal left  atrial chamber size. Moderate MAC  with trivial mitral regurgitation. Severe calcific aortic  stenosis as outlined above with moderate aortic regurgitation.  Mild tricuspid regurgitation with PASP 43 mmHg   Past Medical History  Diagnosis Date  . Hypothyroidism   . Esophageal reflux   . Seasonal allergies   . Hypercholesteremia   . Depression   . Anxiety   . History of diverticulitis of colon   . History of peptic ulcer   . Chronic back pain     "gets it worse as it goes down" (02/24/2015)  . Bladder prolapse, female, acquired   . Heart murmur   . History of electroconvulsive therapy 1956    "then had insulin shock; for deep deep depression"  . History of hiatal hernia   . Migraine     "might have one maybe once/month" (02/24/2015)  . Arthritis     "in my back" (02/24/2015)  . Breast cancer, left breast ([place]) 1970  . Non Hodgkin's lymphoma ([place]) 2001  . Basal cell carcinoma of nasal tip     Past Surgical History  Procedure Laterality Date  . Lumbar laminectomy  2012  . Mastectomy, radical Left 1970  . Back surgery    . Abdominal hysterectomy      "took out my uterus only"  . Cataract extraction w/ intraocular lens  implant, bilateral    . Breast biopsy Left 1970  . Bone marrow biopsy  2001    "for non-Hodgkin's lymphoma"     Current Outpatient Prescriptions  Medication Sig Dispense  Refill  . buPROPion (WELLBUTRIN XL) 150 MG 24 hr tablet Take 300 mg by mouth daily.     . calcium citrate (CALCITRATE - DOSED IN MG ELEMENTAL CALCIUM) 950 MG tablet Take 200 mg of elemental calcium by mouth daily.    . diazepam (VALIUM) 5 MG tablet Take 2 tablets (10 mg total) by mouth every 12 (twelve) hours as needed for anxiety. 20 tablet 0  . docusate sodium (COLACE) 100 MG capsule Take 100 mg by mouth 2 (two) times daily.    . furosemide (LASIX) 40 MG tablet Take 1 tablet (40 mg total) by mouth daily.    . iron polysaccharides (NIFEREX) 150 MG capsule Take 150 mg by mouth daily.      Marland Kitchen levothyroxine (SYNTHROID) 88 MCG tablet Take 88 mcg by mouth daily before breakfast.    . lisinopril (PRINIVIL,ZESTRIL) 2.5 MG tablet Take 1 tablet (2.5 mg total) by mouth 2 (two) times daily.    Marland Kitchen loratadine (CLARITIN) 10 MG tablet Take 10 mg by mouth daily.    Marland Kitchen omeprazole (PRILOSEC) 40 MG capsule Take 1 capsule (40 mg total) by mouth 2 (two) times daily before a meal.    . polyethylene glycol powder (MIRALAX) powder Take 1 Container by mouth daily as needed for mild constipation.     . potassium chloride (K-DUR) 10 MEQ tablet Take 2 tablets (20 mEq total) by mouth daily.    . simvastatin (ZOCOR) 20 MG tablet Take 20 mg by mouth daily.     No current facility-administered medications for this visit.    Allergies:   Amoxicillin; Amoxicillin-pot clavulanate; Azithromycin; Celexa; Iohexol; Pollen extract; Prozac; Levaquin; Promethazine; and Shrimp    Social History:  The patient  reports that she has quit smoking. Her smoking use included Cigarettes. She has a 5 pack-year smoking history. She has never used smokeless tobacco. She reports that she drinks about 0.6 oz of alcohol per week. She reports that she does not use illicit drugs.   Family History:  The patient's family history includes Cancer - Prostate in her father; Diabetes in her mother; Heart attack in her mother; Heart disease in her mother.    ROS:  Please see the history of present illness.    Review of Systems: Constitutional:  denies fever, chills, diaphoresis, appetite change and fatigue.  HEENT: denies photophobia, eye pain, redness, hearing loss, ear pain, congestion, sore throat, rhinorrhea, sneezing, neck pain, neck stiffness and tinnitus.  Respiratory: denies SOB, DOE, cough, chest tightness, and wheezing.  Cardiovascular: denies chest pain, palpitations and leg swelling.  Gastrointestinal: denies nausea, vomiting, abdominal pain, diarrhea, constipation, blood in stool.  Genitourinary: denies dysuria, urgency,  frequency, hematuria, flank pain and difficulty urinating.  Musculoskeletal: denies  myalgias, back pain, joint swelling, arthralgias and gait problem.   Skin: denies pallor, rash and wound.  Neurological: denies dizziness, seizures, syncope, weakness, light-headedness, numbness and headaches.   Hematological: denies adenopathy, easy bruising, personal or family bleeding history.  Psychiatric/ Behavioral: denies suicidal ideation, mood changes, confusion, nervousness, sleep disturbance and agitation.       All other systems are reviewed and negative.    PHYSICAL EXAM: VS:  BP 80/50 mmHg  Pulse 60  Ht 5' (1.524 m)  Wt 130 lb (58.968 kg)  BMI 25.39 kg/m2 , BMI Body mass index is 25.39 kg/(m^2). GEN: Well nourished, well developed, in no acute distress HEENT: normal Neck: no JVD, carotid bruits, or masses Cardiac: RRR; soft  murmurs, rubs, or gallops,no edema ,  Pulses  Respiratory:  Bilateral wheezes, rhonchi ,   Markedly redued breath sounds in lower 1/2 - 2/3 on the right lung field. GI: soft, nontender, nondistended, + BS MS: no deformity or atrophy Skin: warm and dry, no rash Neuro:  Strength and sensation are intact Psych: normal   EKG:  EKG is not ordered today.    Recent Labs: 02/24/2015: TSH 0.674 05/18/2015: Magnesium 1.9 05/28/2015: ALT 24; B Natriuretic Peptide 3253.9*; Hemoglobin 10.9*; Platelets 258 06/02/2015: BUN 20; Creatinine, Ser 0.96; Potassium 3.3*; Sodium 139    Lipid Panel No results found for: CHOL, TRIG, HDL, CHOLHDL, VLDL, LDLCALC, LDLDIRECT    Wt Readings from Last 3 Encounters:  06/22/15 130 lb (58.968 kg)  06/02/15 133 lb 9.6 oz (60.6 kg)  05/19/15 137 lb 14.4 oz (62.551 kg)      Other studies Reviewed: Additional studies/ records that were reviewed today include: . Review of the above records demonstrates:    ASSESSMENT AND PLAN:  1.  Aortic stenosis:   She has severe aortic stenosis and is not a candidate for TAVR.  She has severe  dementia and is not able to participate in her medical care. She has severe lung disease . Has markedly reduced breath sounds on her right side.  She is currently in a rehab facility [facility]) but has not been able ( or willing ) to participate in any rehab efforts. They have planned on sending her to another facility this weekend.  I have discussed the situation with her family.   She has end stage heart disease and also has some degree of lung disease  She is not a candidate for TAVR. I think that she needs to be referred to Hospice and Palliative care for comfort measures. I've talked to her Primary medical doctor- Dr. Jeremy Johann .  She agrees with the palliative approach. Because of her severe hypertension will need to stop her lisinopril and also hold her Lasix. Dr. Tamala Julian will try to arrange an urgent hospice and palate care tonight.   Current medicines are reviewed at length with the patient today.  The patient does not have concerns regarding medicines.  The following changes have been made:  no change  Labs/ tests ordered today include:  No orders of the defined types were placed in this encounter.     Disposition:   FU with her primary medical doctor as needed      Christena Sunderlin, Wonda Cheng, MD  06/22/2015 4:11 PM    [facility] [place], [place], [place]  [address] Phone: [phone]; Fax: [phone]   [facility]  [address] [place], [place]  [address] [phone]   Fax [phone]

## 2016-01-20 DEATH — deceased

## 2017-01-08 IMAGING — DX DG CHEST 1V
1 series · 1 of 1 positions shown · non-contrast
Comparison: 03/14/2014

CLINICAL DATA: Multiple recent falls with chest pain, shortness of
Breath

EXAM:
CHEST  1 VIEW

[t chest supine]
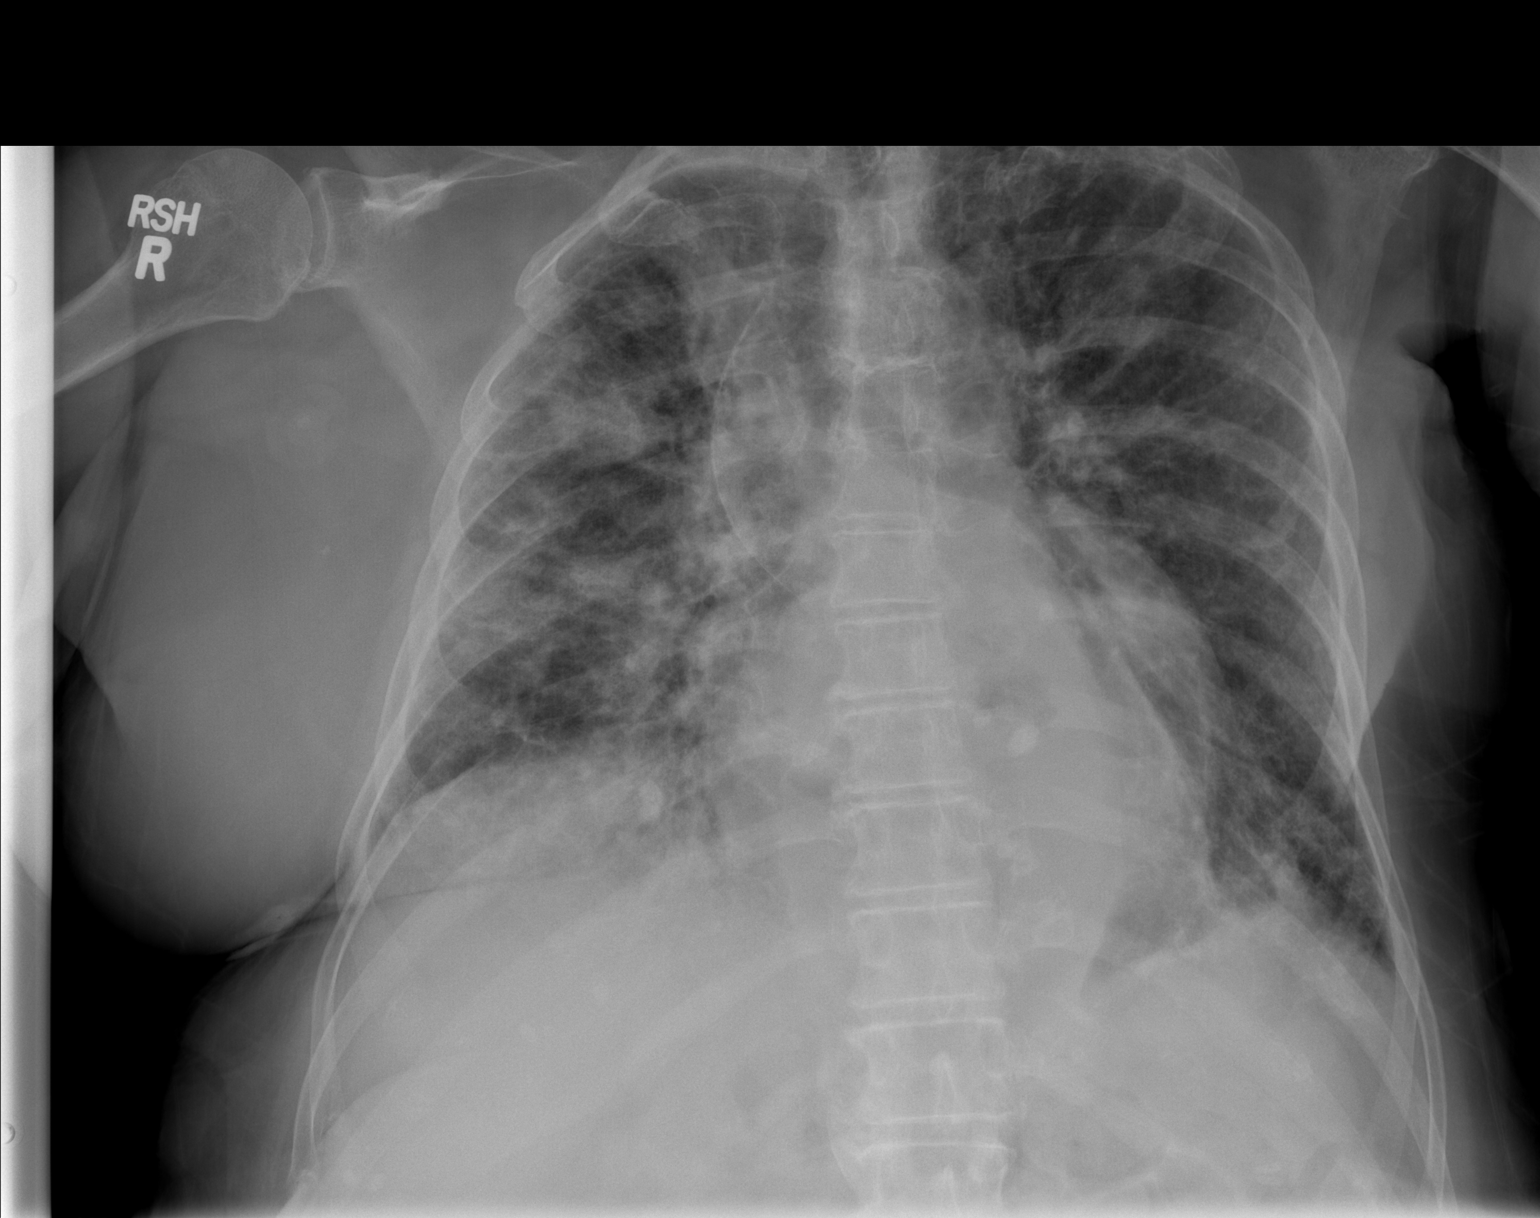

[1 of 1 positions shown; findings below may reference images not displayed]

FINDINGS: The cardiac shadow is stable. A large hiatal hernia is again
identified. Diffuse patchy changes are identified throughout both
lungs. Although these may be chronic in nature there are new from
the prior exam and some acute component must be considered. No
sizable effusion is noted. Aortic calcifications are seen. No bony
abnormality is noted.
IMPRESSION: Patchy bilateral densities likely representing chronic fibrosis
although an acute component cannot be totally excluded. Short-term
followup following appropriate therapy is recommended.

## 2017-01-10 IMAGING — CR DG CHEST 1V PORT
1 series · 1 of 1 positions shown · non-contrast
Comparison: Chest radiograph performed 02/24/2015

CLINICAL DATA: Acute onset of shortness of breath and respiratory
distress. Initial encounter.

EXAM:
PORTABLE CHEST 1 VIEW

[AP]
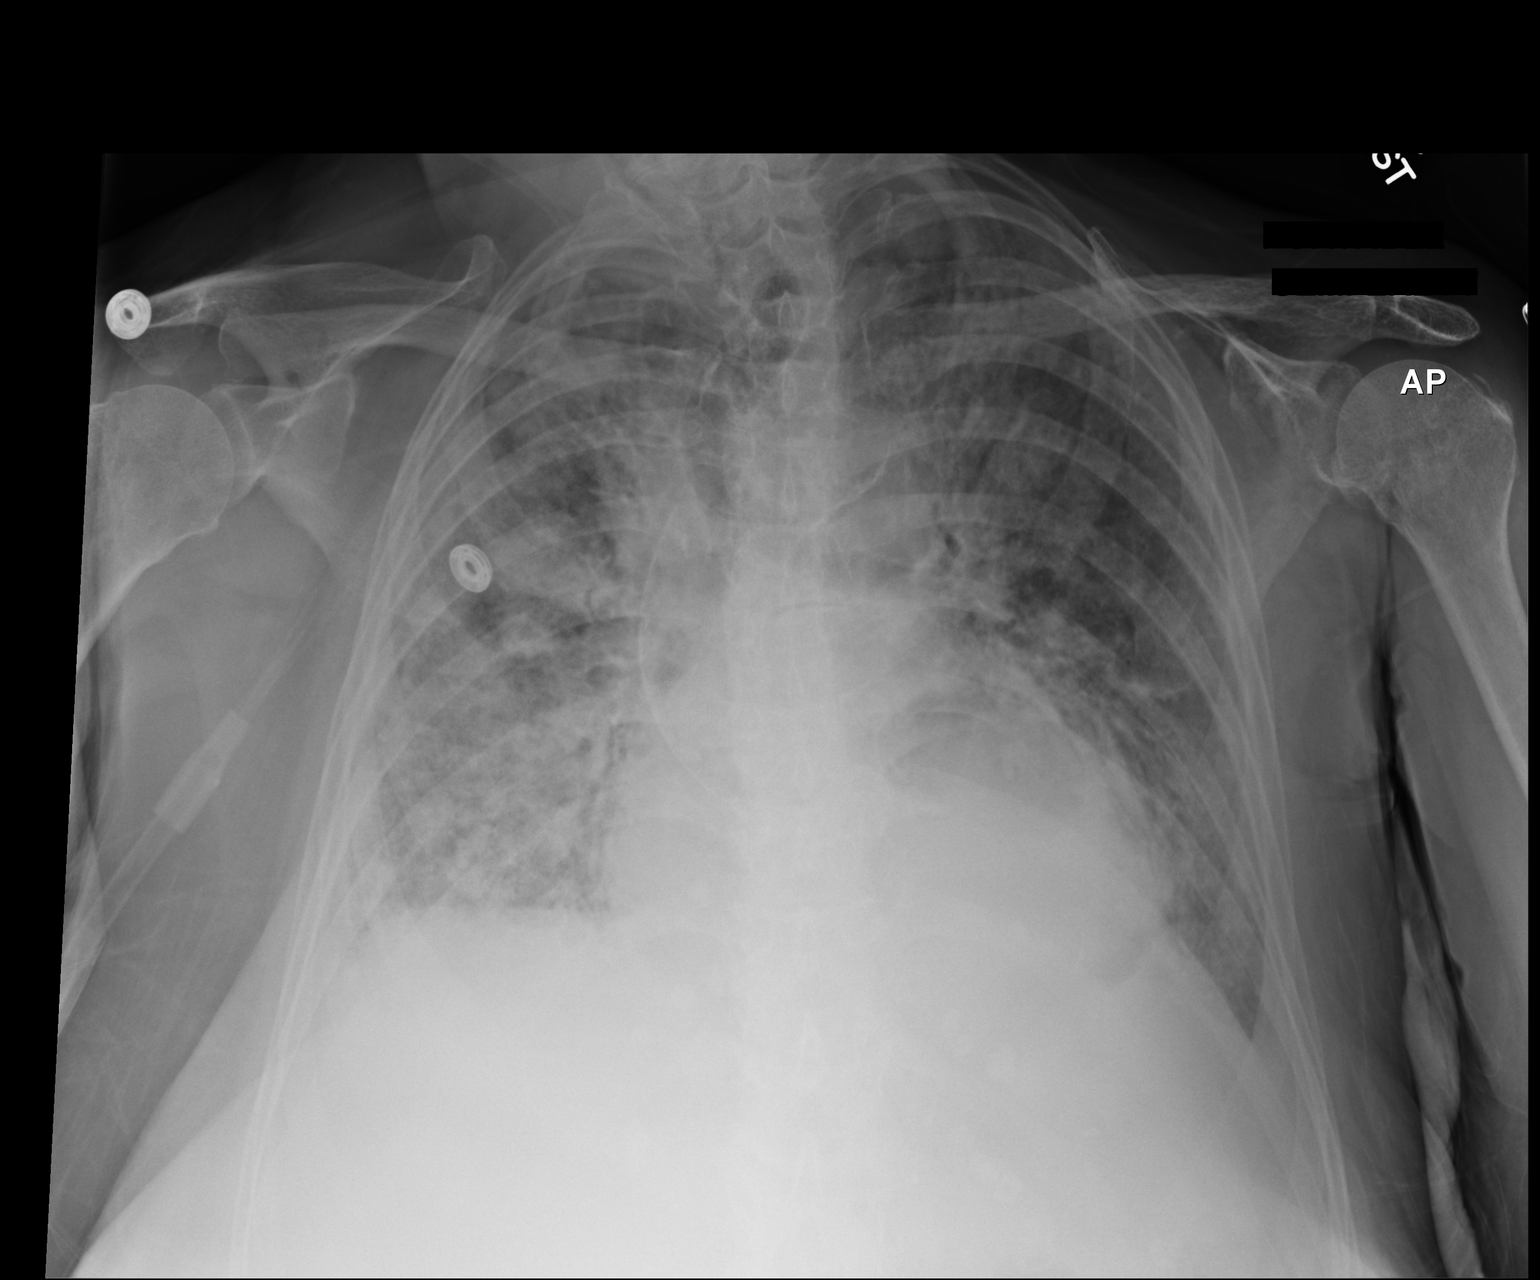

[1 of 1 positions shown; findings below may reference images not displayed]

FINDINGS: Diffuse bilateral airspace opacification is noted, worsened from the
prior study. This may reflect pulmonary edema, right worse than
left, or multifocal pneumonia. Small bilateral pleural effusions are
noted. No pneumothorax is seen.

The cardiomediastinal silhouette is mildly enlarged. No acute
osseous abnormalities are identified.
IMPRESSION: Diffuse bilateral airspace opacification is worsened from the prior
study. This may reflect pulmonary edema, right worse than left, or
multifocal pneumonia. Small bilateral pleural effusions noted. Mild
cardiomegaly.

## 2017-03-07 IMAGING — DX DG CHEST 2V
2 series · 2 of 2 positions shown · non-contrast
Comparison: 03/04/2015

CLINICAL DATA: Worsening shortness of Breath

EXAM:
CHEST  2 VIEW

[chest lat]
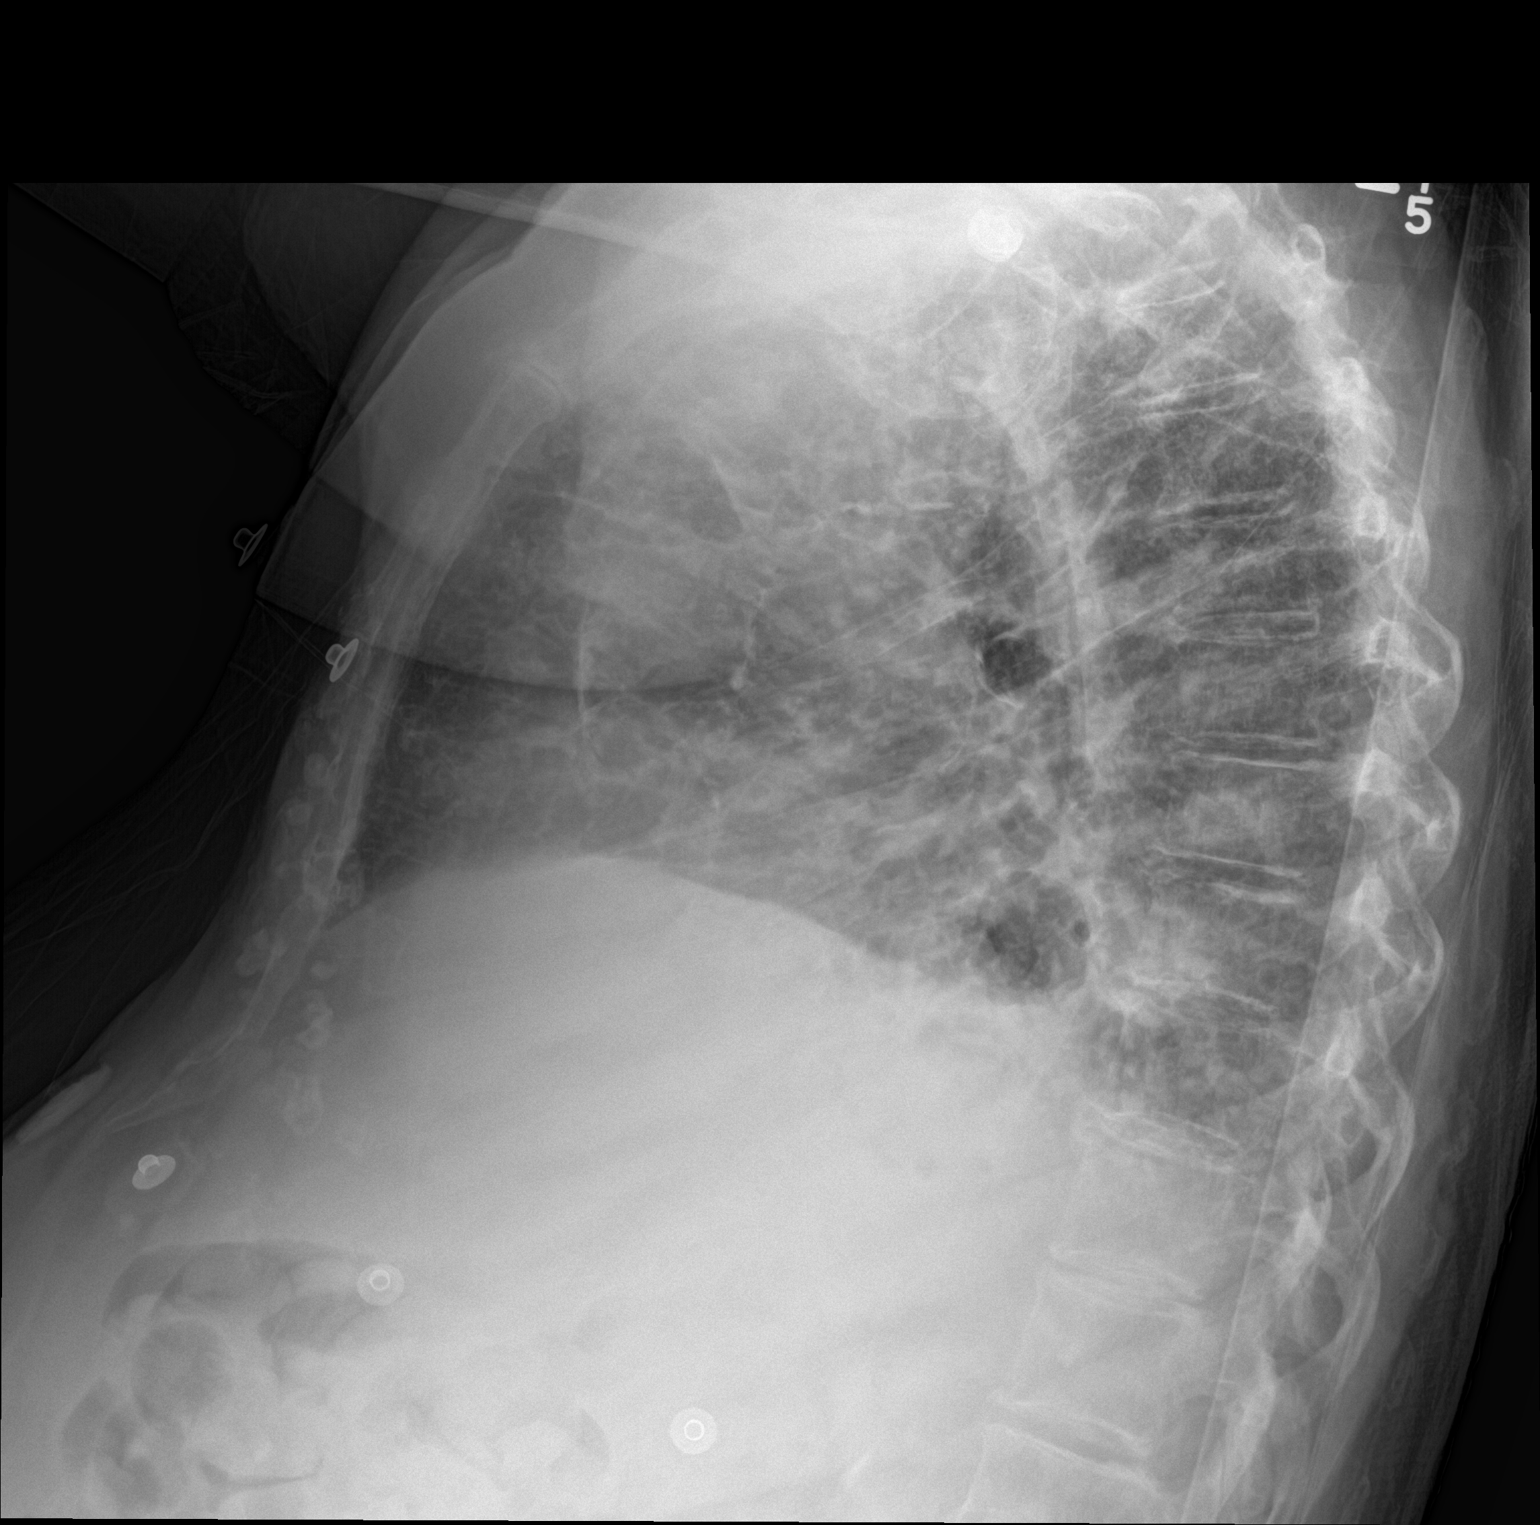

[chest ap]
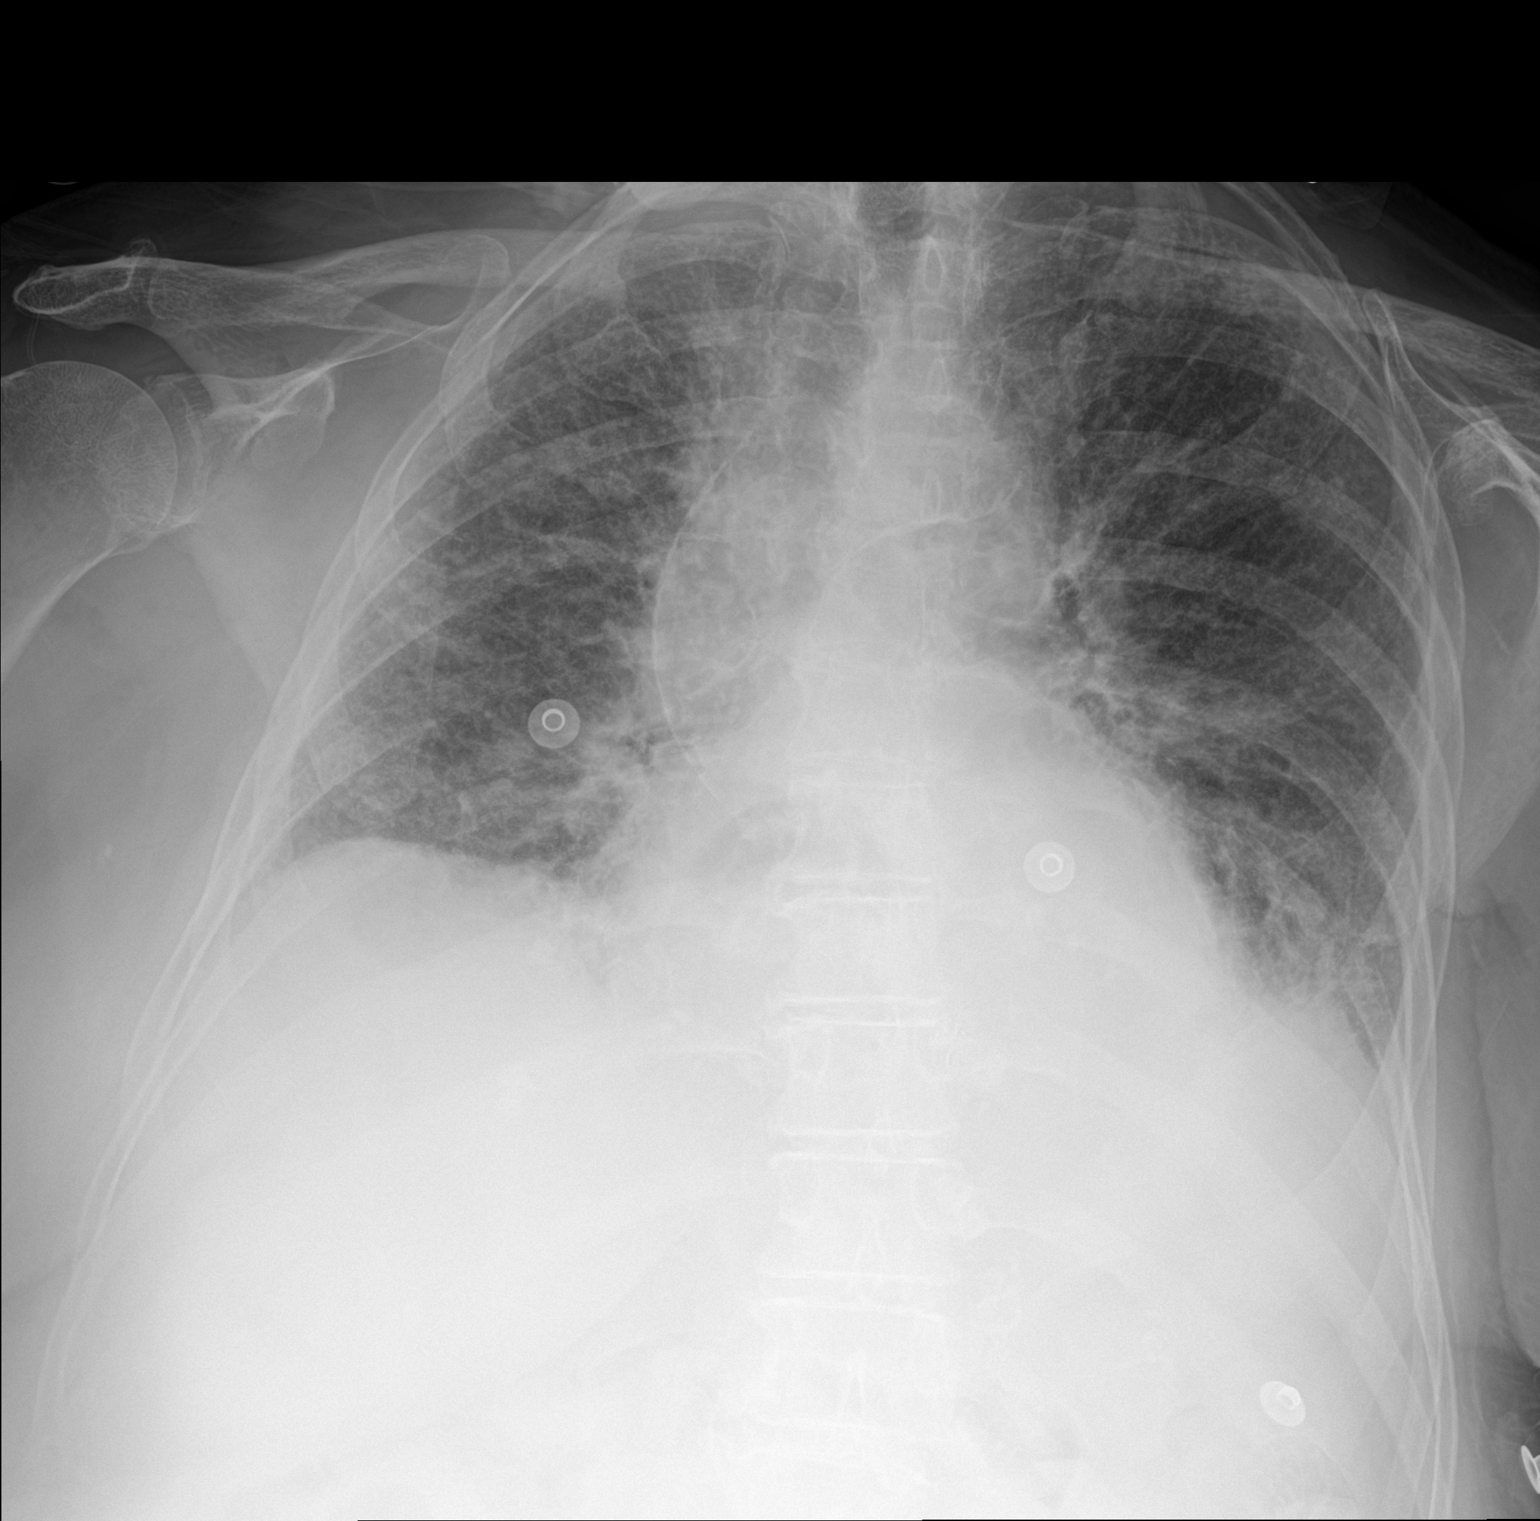

[2 of 2 positions shown; findings below may reference images not displayed]

FINDINGS: Cardiomediastinal silhouette is stable. Tortuous ascending aorta
with atherosclerotic calcifications are noted. Hiatal hernia again
noted. Again noted bilateral interstitial prominence suspicious for
interstitial edema or pneumonitis. There is trace left pleural
effusion with left basilar atelectasis or infiltrate.
IMPRESSION: Hiatal hernia again noted. Again noted bilateral interstitial
prominence suspicious for interstitial edema or pneumonitis. There
is trace left pleural effusion with left basilar atelectasis or
infiltrate.

## 2017-03-08 IMAGING — DX DG CHEST 1V PORT
1 series · 1 of 1 positions shown · non-contrast
Comparison: 04/23/2015

CLINICAL DATA: CHF

EXAM:
PORTABLE CHEST 1 VIEW

[chest ap]
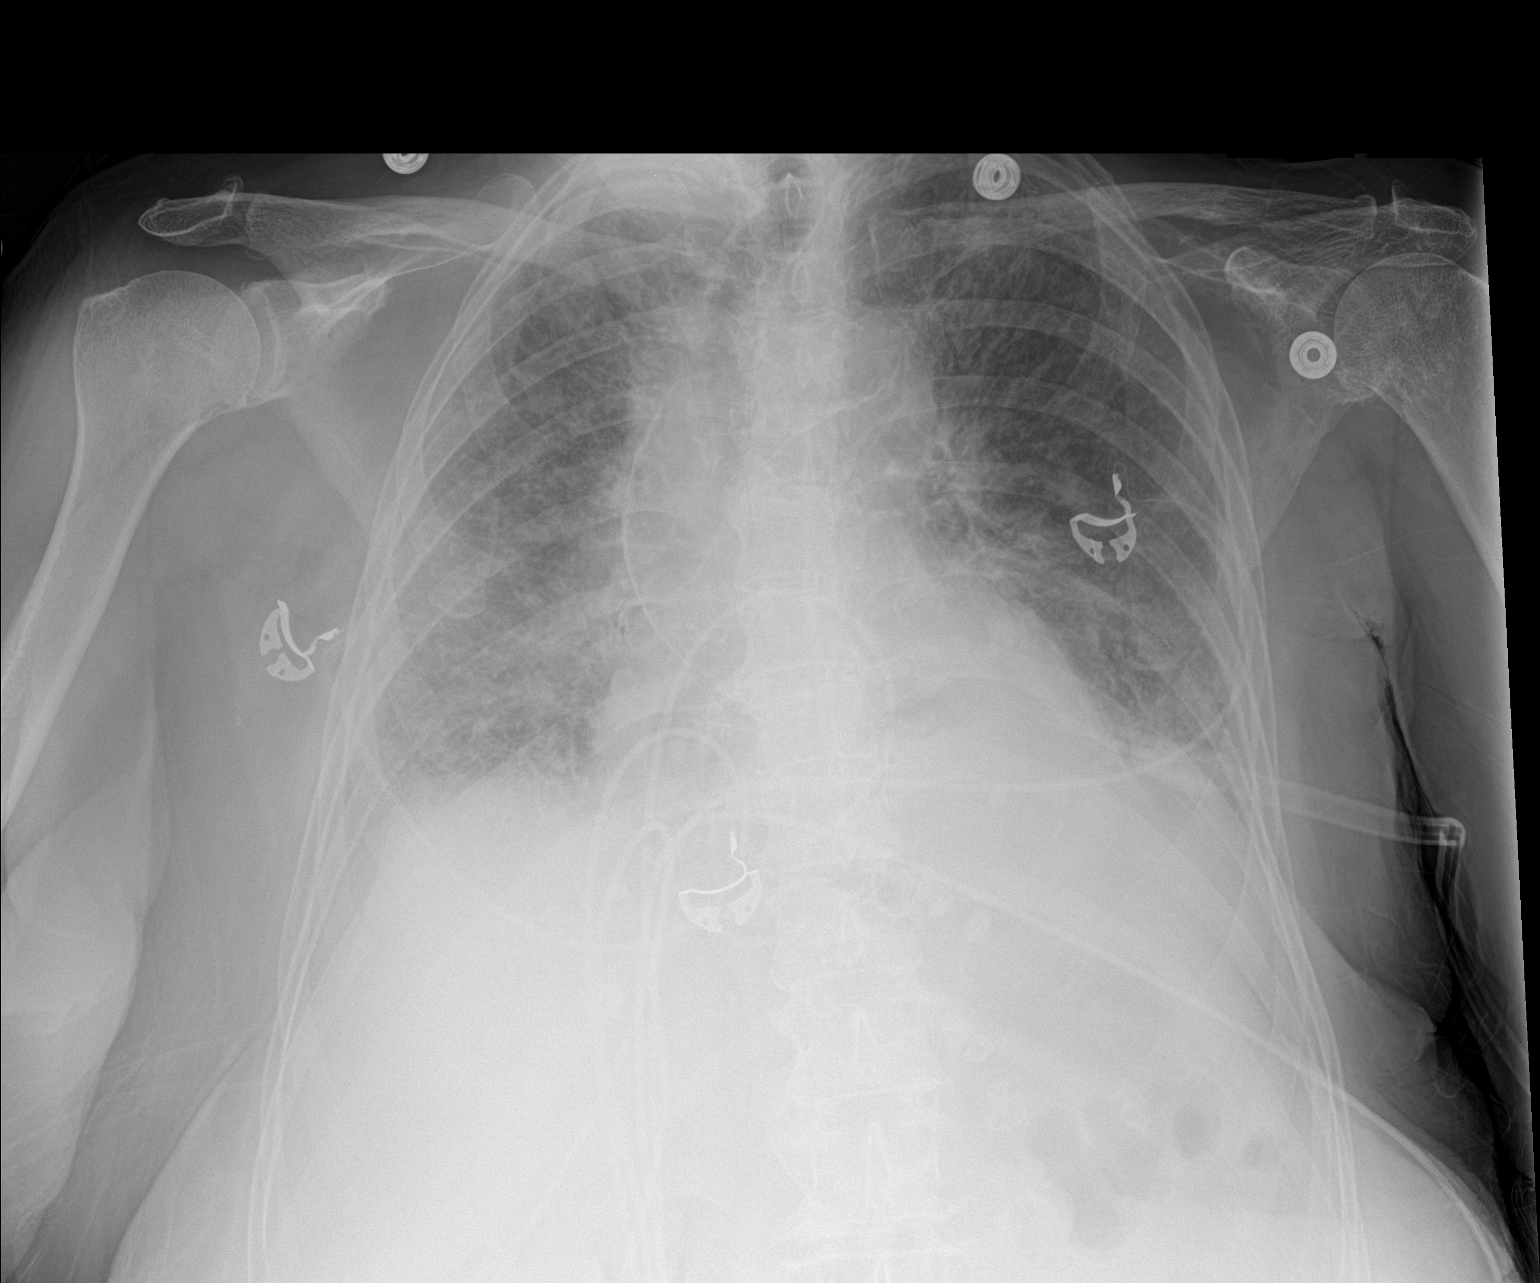

[1 of 1 positions shown; findings below may reference images not displayed]

FINDINGS: Cardiac shadow is stable. Aortic calcifications are again
identified. Changes consistent with congestive failure are again
noted. No focal confluent infiltrate is seen. No sizable effusion is
noted. No bony abnormality is seen.
IMPRESSION: Stable changes consistent with CHF.
